# Patient Record
Sex: Female | Born: 1980 | Race: White | Hispanic: No | Marital: Married | Smoking: Never smoker
Health system: Southern US, Community
[De-identification: ages and names within clinical notes are randomized; demographics above are authoritative.]

## PROBLEM LIST (undated history)

## (undated) DIAGNOSIS — M1990 Unspecified osteoarthritis, unspecified site: Secondary | ICD-10-CM

## (undated) DIAGNOSIS — M549 Dorsalgia, unspecified: Secondary | ICD-10-CM

## (undated) DIAGNOSIS — N939 Abnormal uterine and vaginal bleeding, unspecified: Secondary | ICD-10-CM

## (undated) DIAGNOSIS — R51 Headache: Secondary | ICD-10-CM

## (undated) DIAGNOSIS — R002 Palpitations: Secondary | ICD-10-CM

## (undated) DIAGNOSIS — Z8759 Personal history of other complications of pregnancy, childbirth and the puerperium: Secondary | ICD-10-CM

## (undated) DIAGNOSIS — O09299 Supervision of pregnancy with other poor reproductive or obstetric history, unspecified trimester: Secondary | ICD-10-CM

## (undated) DIAGNOSIS — E785 Hyperlipidemia, unspecified: Secondary | ICD-10-CM

## (undated) DIAGNOSIS — O139 Gestational [pregnancy-induced] hypertension without significant proteinuria, unspecified trimester: Secondary | ICD-10-CM

## (undated) DIAGNOSIS — Z3490 Encounter for supervision of normal pregnancy, unspecified, unspecified trimester: Secondary | ICD-10-CM

## (undated) DIAGNOSIS — T7840XA Allergy, unspecified, initial encounter: Secondary | ICD-10-CM

## (undated) DIAGNOSIS — F419 Anxiety disorder, unspecified: Secondary | ICD-10-CM

## (undated) DIAGNOSIS — E669 Obesity, unspecified: Secondary | ICD-10-CM

## (undated) HISTORY — DX: Encounter for supervision of normal pregnancy, unspecified, unspecified trimester: Z34.90

## (undated) HISTORY — DX: Hyperlipidemia, unspecified: E78.5

## (undated) HISTORY — PX: SPINE SURGERY: SHX786

## (undated) HISTORY — PX: TONSILLECTOMY: SUR1361

## (undated) HISTORY — DX: Allergy, unspecified, initial encounter: T78.40XA

## (undated) HISTORY — DX: Palpitations: R00.2

## (undated) HISTORY — DX: Anxiety disorder, unspecified: F41.9

## (undated) HISTORY — DX: Dorsalgia, unspecified: M54.9

## (undated) HISTORY — DX: Abnormal uterine and vaginal bleeding, unspecified: N93.9

## (undated) HISTORY — PX: WISDOM TOOTH EXTRACTION: SHX21

## (undated) HISTORY — PX: DILATION AND CURETTAGE OF UTERUS: SHX78

## (undated) HISTORY — DX: Gestational (pregnancy-induced) hypertension without significant proteinuria, unspecified trimester: O13.9

## (undated) HISTORY — DX: Obesity, unspecified: E66.9

---
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
## 2005-01-02 ENCOUNTER — Ambulatory Visit (HOSPITAL_COMMUNITY): Admission: RE | Admit: 2005-01-02 | Discharge: 2005-01-02 | Payer: Self-pay | Admitting: Obstetrics & Gynecology

## 2005-02-07 ENCOUNTER — Inpatient Hospital Stay (HOSPITAL_COMMUNITY): Admission: AD | Admit: 2005-02-07 | Discharge: 2005-02-08 | Payer: Self-pay | Admitting: Obstetrics and Gynecology

## 2005-02-20 ENCOUNTER — Inpatient Hospital Stay (HOSPITAL_COMMUNITY): Admission: RE | Admit: 2005-02-20 | Discharge: 2005-02-22 | Payer: Self-pay | Admitting: Obstetrics and Gynecology

## 2005-02-20 DIAGNOSIS — O139 Gestational [pregnancy-induced] hypertension without significant proteinuria, unspecified trimester: Secondary | ICD-10-CM

## 2005-05-23 ENCOUNTER — Ambulatory Visit: Payer: Self-pay | Admitting: Family Medicine

## 2005-05-28 ENCOUNTER — Ambulatory Visit (HOSPITAL_COMMUNITY): Admission: RE | Admit: 2005-05-28 | Discharge: 2005-05-28 | Payer: Self-pay | Admitting: Family Medicine

## 2005-06-18 ENCOUNTER — Encounter: Admission: RE | Admit: 2005-06-18 | Discharge: 2005-06-18 | Payer: Self-pay | Admitting: Neurosurgery

## 2005-07-03 ENCOUNTER — Encounter: Admission: RE | Admit: 2005-07-03 | Discharge: 2005-07-03 | Payer: Self-pay | Admitting: Neurosurgery

## 2005-07-17 ENCOUNTER — Encounter: Admission: RE | Admit: 2005-07-17 | Discharge: 2005-07-17 | Payer: Self-pay | Admitting: Neurosurgery

## 2005-12-04 ENCOUNTER — Ambulatory Visit: Payer: Self-pay | Admitting: Family Medicine

## 2007-05-15 ENCOUNTER — Other Ambulatory Visit: Admission: RE | Admit: 2007-05-15 | Discharge: 2007-05-15 | Payer: Self-pay | Admitting: Obstetrics and Gynecology

## 2008-05-19 ENCOUNTER — Other Ambulatory Visit: Admission: RE | Admit: 2008-05-19 | Discharge: 2008-05-19 | Payer: Self-pay | Admitting: Obstetrics and Gynecology

## 2009-02-16 ENCOUNTER — Encounter: Payer: Self-pay | Admitting: Family Medicine

## 2009-07-17 ENCOUNTER — Emergency Department (HOSPITAL_COMMUNITY): Admission: EM | Admit: 2009-07-17 | Discharge: 2009-07-17 | Payer: Self-pay | Admitting: Emergency Medicine

## 2009-10-09 ENCOUNTER — Ambulatory Visit: Payer: Self-pay | Admitting: Family Medicine

## 2009-10-09 DIAGNOSIS — R5381 Other malaise: Secondary | ICD-10-CM | POA: Insufficient documentation

## 2009-10-09 DIAGNOSIS — E66811 Obesity, class 1: Secondary | ICD-10-CM | POA: Insufficient documentation

## 2009-10-09 DIAGNOSIS — R5383 Other fatigue: Secondary | ICD-10-CM

## 2009-10-09 DIAGNOSIS — J018 Other acute sinusitis: Secondary | ICD-10-CM | POA: Insufficient documentation

## 2009-10-09 DIAGNOSIS — E669 Obesity, unspecified: Secondary | ICD-10-CM

## 2009-10-09 HISTORY — DX: Obesity, unspecified: E66.9

## 2009-11-30 ENCOUNTER — Other Ambulatory Visit: Admission: RE | Admit: 2009-11-30 | Discharge: 2009-11-30 | Payer: Self-pay | Admitting: Obstetrics and Gynecology

## 2010-03-12 ENCOUNTER — Ambulatory Visit: Payer: Self-pay | Admitting: Family Medicine

## 2010-03-12 DIAGNOSIS — J309 Allergic rhinitis, unspecified: Secondary | ICD-10-CM | POA: Insufficient documentation

## 2010-05-15 NOTE — Assessment & Plan Note (Signed)
Summary: office visit   Vital Signs:  Patient profile:   30 year old female Menstrual status:  regular Weight:      245.08 pounds Pulse rate:   100 / minute Pulse rhythm:   regular BP sitting:   110 / 80  History of Present Illness: Reports  that she has been doing well. She has not changed her lifestyle to facilitate weight loss, hence her weight is unchanged. She is contemplating having a 3rd pregnancy, an recent removed her contraceptive device. Denies recent fever or chills. Denies sinus pressure, nasal congestion , ear pain or sore throat. Denies chest congestion, or cough productive of sputum. Denies chest pain, palpitations, PND, orthopnea or leg swelling. Denies abdominal pain, nausea, vomitting, diarrhea or constipation. Denies change in bowel movements or bloody stool. Denies dysuria , frequency, incontinence or hesitancy. Denies  joint pain, swelling, or reduced mobility. Denies headaches, vertigo, seizures. Denies depression, anxiety or insomnia.    Allergies: 1)  ! * Zyrtec 2)  ! * Chocolate 3)  ! * Seafood  Review of Systems      See HPI Eyes:  Denies blurring and discharge. Derm:  Complains of lesion(s); skin lesions x 2 on left forearm and l;eft thighx 4 months, no pain or drainage. Endo:  Denies cold intolerance, excessive hunger, excessive thirst, and heat intolerance. Heme:  Denies abnormal bruising and bleeding. Allergy:  Denies hives or rash and itching eyes.  Physical Exam  General:  Well-developed,obese, pleasant female,,in no acute distress; alert,appropriate and cooperative throughout examination HEENT: No facial asymmetry,  EOMIno sinus tenderness, TM's Clear, oropharynx  pink and moist.   Chest: Clear to auscultation bilaterally.  CVS: S1, S2, No murmurs, No S3.   Abd: Soft, Nontender.  MS: Adequate ROM spine, hips, shoulders and knees.  Ext: No edema.   CNS: CN 2-12 intact, power tone and sensation normal throughout.   Skin: Intact,  hypopigmented lesions on left forearm and left thigh Psych: Good eye contact, normal affect.  Memory intact, not anxious or depressed appearing.    Impression & Recommendations:  Problem # 1:  MORBID OBESITY (ICD-278.01) Assessment Unchanged  Ht: 63 (10/09/2009)   Wt: 245.08 (03/12/2010)   BMI: 43.42 (10/09/2009) therapeutic lifestyle change discussed and encouraged  Complete Medication List: 1)  Claritin 10 Mg Tabs (Loratadine) .... Take 1 tablet by mouth once a day 2)  Tylenol Extra Strength 500 Mg Tabs (Acetaminophen) .... On avg 4 tabs per week for back pain 3)  Vitamin D (otc)  .... One daily  Patient Instructions: 1)  Follow up appointment in 5.79months 2)  It is important that you exercise regularly at least 20 minutes 5 times a week. If you develop chest pain, have severe difficulty breathing, or feel very tired , stop exercising immediately and seek medical attention. 3)  You need to lose weight. Consider a lower calorie diet and regular exercise.  4)  PLS call back if you  you want a referral for dermatology or if you change your mind about the phentermine .   Orders Added: 1)  Est. Patient Level III [38756]

## 2010-05-15 NOTE — Assessment & Plan Note (Signed)
Summary: new pt   Vital Signs:  Patient profile:   30 year old female Menstrual status:  regular LMP:     09/20/2009 Height:      63 inches Weight:      244.25 pounds BMI:     43.42 O2 Sat:      94 % Pulse rate:   110 / minute Pulse rhythm:   regular Resp:     16 per minute BP sitting:   114 / 80  (left arm) Cuff size:   large  Vitals Entered By: Everitt Amber LPN (October 09, 2009 1:35 PM)  Nutrition Counseling: Patient's BMI is greater than 25 and therefore counseled on weight management options. CC: New patient  LMP (date): 09/20/2009     Menstrual Status regular Enter LMP: 09/20/2009   CC:  New patient .  History of Present Illness: Reports  that she has been  doing well up until 2 weweks ago when she developed symtoms of sinusitis which have worsened. She is re-establishing care, as she has not been seen in the past 3 years.. She is concerned about weight gain, and wants to invest renewed effort through exercise and diet to reverse this. She wil call in 2 months if no success for phentermine , which she has used in the past.  Denies chest congestion, or cough productive of sputum. Denies chest pain, palpitations, PND, orthopnea or leg swelling. Denies abdominal pain, nausea, vomitting, diarrhea or constipation. Denies change in bowel movements or bloody stool. Denies dysuria , frequency, incontinence or hesitancy. Denies  joint pain, swelling, or reduced mobility. Denies headaches, vertigo, seizures. Denies depression, anxiety or insomnia. Denies  rash, lesions, or itch.     Current Medications (verified): 1)  None  Allergies (verified): 1)  ! * Zyrtec 2)  ! * Chocolate 3)  ! * Seafood  Past History:  Past Medical History: Last updated: 04/24/2007 Obesity Seasonal Allergies Chronic Back Pain  Past Surgical History: Last updated: 04/24/2007 Tonsillectomy (10/1999)  Family History: Last updated: 10/09/2009 Father:  living-healthy Mother:-living-healthy Siblings-brother-healthy:   Social History: Last updated: 10/09/2009 EMPLOYED, nurse at NICU , women's Married Never Smoked Alcohol use-no Drug use-no TWO CHILDREN, ages 11 and 61  years old in 2011  Risk Factors: Exercise: yes (04/24/2007)  Risk Factors: Smoking Status: never (12/30/2007)  Family History: Father: living-healthy Mother:-living-healthy Siblings-brother-healthy:   Social History: EMPLOYED, nurse at NICU , women's Married Never Smoked Alcohol use-no Drug use-no TWO CHILDREN, ages 93 and 62  years old in 2011  Review of Systems      See HPI General:  Complains of chills and fatigue; denies fever. Eyes:  Denies blurring, discharge, eye pain, and red eye. ENT:  Complains of nasal congestion and sinus pressure; denies sore throat; had strep April 4, then 3 week h/o facial pressure green drainage , intermittent fever and chillsup to 101 approx 1 wek ago . CV:  Denies chest pain or discomfort, palpitations, and swelling of feet. Resp:  Denies cough, shortness of breath, sputum productive, and wheezing. GI:  Denies abdominal pain, constipation, diarrhea, nausea, and vomiting. GU:  Denies dysuria and urinary frequency. MS:  Complains of low back pain; intermittent low back pain, has established disc disease. Derm:  Denies rash; no new skin lesions. Neuro:  Complains of headaches; denies seizures, sensation of room spinning, and tingling; frotal and ethmoidal headaches x 1 week, since sick. Psych:  Denies anxiety and depression. Endo:  Denies cold intolerance, excessive thirst, excessive urination, and heat  intolerance. Heme:  Denies abnormal bruising and bleeding. Allergy:  Complains of seasonal allergies; denies hives or rash; early spring, runny nose and eyes, uses claritin as needed.  Physical Exam  General:  Well-developed,obese, pleasant female,,in no acute distress; alert,appropriate and cooperative throughout  examination HEENT: No facial asymmetry,  EOMI, positive frontal and ethmoidal  sinus tenderness, TM's Clear, oropharynx  pink and moist.   Chest: Clear to auscultation bilaterally. anterior cervical adenitis bilaterally CVS: S1, S2, No murmurs, No S3.   Abd: Soft, Nontender.  MS: Adequate ROM spine, hips, shoulders and knees.  Ext: No edema.   CNS: CN 2-12 intact, power tone and sensation normal throughout.   Skin: Intact, no visible lesions or rashes.  Psych: Good eye contact, normal affect.  Memory intact, not anxious or depressed appearing.    Impression & Recommendations:  Problem # 1:  MORBID OBESITY (ICD-278.01) Assessment Comment Only  Ht: 63 (10/09/2009)   Wt: 244.25 (10/09/2009)   BMI: 43.42 (10/09/2009), counselled re need to establish exercise routine to incorporate 150 mins/week at least, also to inc veg and fruit intake and reduce carbs, also to change to zero cal drinks  Problem # 2:  OTHER ACUTE SINUSITIS (ICD-461.8) Assessment: Comment Only  Her updated medication list for this problem includes:    Septra Ds 800-160 Mg Tabs (Sulfamethoxazole-trimethoprim) .Marland Kitchen... Take 1 tablet by mouth two times a day  Instructed on treatment. Call if symptoms persist or worsen.   Complete Medication List: 1)  Septra Ds 800-160 Mg Tabs (Sulfamethoxazole-trimethoprim) .... Take 1 tablet by mouth two times a day 2)  Fluconazole 150 Mg Tabs (Fluconazole) .... Take 1 tablet by mouth once a day as needed  Other Orders: T-Basic Metabolic Panel [phone]) T-Lipid Profile [phone]) T-TSH [phone]) T-CBC w/Diff [phone])  Patient Instructions: 1)  F/U in 5 months  2)  It is important that you exercise regularly at least 20 minutes 5 times a week. If you develop chest pain, have severe difficulty breathing, or feel very tired , stop exercising immediately and seek medical attention. 3)  You need to lose weight. Consider a lower calorie diet and regular exercise. Goal is 3  to 4 pounds per month. 4)  BMP prior to visit, ICD-9: 5)  Lipid Panel prior to visit, ICD-9: 6)  TSH prior to visit, ICD-9:   fasting asap 7)  CBC w/ Diff prior to visit, ICD-9: 8)  You need to have a Pap Smear to prevent cervical cancer. 9)  Meds are sent i for your sinus infection Prescriptions: FLUCONAZOLE 150 MG TABS (FLUCONAZOLE) Take 1 tablet by mouth once a day as needed  #3 x 0   Entered and Authorized by:   Syliva Overman MD   Signed by:   Syliva Overman MD on 10/09/2009   Method used:   Print then Give to Patient   RxID:   [id number] SEPTRA DS 800-160 MG TABS (SULFAMETHOXAZOLE-TRIMETHOPRIM) Take 1 tablet by mouth two times a day  #20 x 0   Entered and Authorized by:   Syliva Overman MD   Signed by:   Syliva Overman MD on 10/09/2009   Method used:   Print then Give to Patient   RxID:   [id number]

## 2010-07-04 LAB — DIFFERENTIAL
Basophils Absolute: 0 10*3/uL (ref 0.0–0.1)
Basophils Relative: 0 % (ref 0–1)
Eosinophils Absolute: 0 10*3/uL (ref 0.0–0.7)
Eosinophils Relative: 0 % (ref 0–5)
Lymphocytes Relative: 6 % — ABNORMAL LOW (ref 12–46)
Lymphs Abs: 0.9 10*3/uL (ref 0.7–4.0)
Monocytes Absolute: 0.6 10*3/uL (ref 0.1–1.0)
Monocytes Relative: 4 % (ref 3–12)
Neutro Abs: 12 10*3/uL — ABNORMAL HIGH (ref 1.7–7.7)
Neutrophils Relative %: 89 % — ABNORMAL HIGH (ref 43–77)

## 2010-07-04 LAB — CBC
HCT: 37.2 % (ref 36.0–46.0)
Hemoglobin: 12.9 g/dL (ref 12.0–15.0)
MCHC: 34.7 g/dL (ref 30.0–36.0)
MCV: 83.2 fL (ref 78.0–100.0)
Platelets: 256 10*3/uL (ref 150–400)
RBC: 4.48 MIL/uL (ref 3.87–5.11)
RDW: 13.9 % (ref 11.5–15.5)
WBC: 13.4 10*3/uL — ABNORMAL HIGH (ref 4.0–10.5)

## 2010-07-04 LAB — BASIC METABOLIC PANEL
BUN: 13 mg/dL (ref 6–23)
CO2: 21 mEq/L (ref 19–32)
Calcium: 9.3 mg/dL (ref 8.4–10.5)
Chloride: 105 mEq/L (ref 96–112)
Creatinine, Ser: 0.74 mg/dL (ref 0.4–1.2)
GFR calc Af Amer: >60 mL/min (ref >60)
GFR calc non Af Amer: >60 mL/min (ref >60)
Glucose, Bld: 123 mg/dL — ABNORMAL HIGH (ref 70–99)
Potassium: 3.8 mEq/L (ref 3.5–5.1)
Sodium: 134 mEq/L — ABNORMAL LOW (ref 135–145)

## 2010-07-04 LAB — URINALYSIS, ROUTINE W REFLEX MICROSCOPIC
Bilirubin Urine: NEGATIVE
Glucose, UA: NEGATIVE mg/dL
Hgb urine dipstick: NEGATIVE
Ketones, ur: NEGATIVE mg/dL
Nitrite: NEGATIVE
Protein, ur: NEGATIVE mg/dL
Specific Gravity, Urine: 1.015 (ref 1.005–1.030)
Urobilinogen, UA: 0.2 mg/dL (ref 0.0–1.0)
pH: 8 (ref 5.0–8.0)

## 2010-07-04 LAB — D-DIMER, QUANTITATIVE (NOT AT ARMC): D-Dimer, Quant: 0.22 ug/mL-FEU (ref 0.00–0.48)

## 2010-07-04 LAB — PREGNANCY, URINE: Preg Test, Ur: NEGATIVE

## 2010-08-30 ENCOUNTER — Encounter: Payer: Self-pay | Admitting: Family Medicine

## 2010-08-31 NOTE — Op Note (Signed)
NAMENIKYA, BUSLER NO.:  [id number]   MEDICAL RECORD NO.:  [id number]          PATIENT TYPE:  INP   LOCATION:  LDR4                          FACILITY:  [facility]   PHYSICIAN:  Lazaro Arms, M.D.   DATE OF BIRTH:  11-12-1980   DATE OF PROCEDURE:  02/20/2005  DATE OF DISCHARGE:  02/22/2005                                 OPERATIVE REPORT   EPIDURAL NOTE:  [name] was placed in a sitting position after she was in  early labor. Betadine prep was used. The area was field draped. One percent  lidocaine was injected at the L3-L4 interspace. A 17-gauge Tuohy needle was  used, and a loss-of-resistance technique employed, and the epidural space  was found with one pass without difficulty. Ten cc of 0.125% bupivacaine was  given into the epidural space as test dose. The epidural catheter was then  fed. An additional 10 cc was given to dose up the epidural. It was taped 5  cm into the epidural space, and a continuous infusion of 0.125% bupivacaine  and 2 mcg/cc of fentanyl was begun at 12 cc an hour. The patient is getting  good pain relief. Fetal heart rate tracing is stable, and the blood pressure  is stable.      Lazaro Arms, M.D.  Electronically Signed     LHE/MEDQ  D:  03/13/2005  T:  03/14/2005  Job:  [id number]

## 2010-08-31 NOTE — Discharge Summary (Signed)
NAMEERIKKA, FOLLMER NO.:  [id number]   MEDICAL RECORD NO.:  [id number]          PATIENT TYPE:  INP   LOCATION:  LDR4                          FACILITY:  [facility]   PHYSICIAN:  Lazaro Arms, M.D.   DATE OF BIRTH:  [birth year]   DATE OF ADMISSION:  02/07/2005  DATE OF DISCHARGE:  10/27/2006LH                                 DISCHARGE SUMMARY   DISCHARGE DIAGNOSES:  1.  Intrauterine pregnancy at 37-plus weeks gestation.  2.  Rule out mild preeclampsia.   HISTORY:  Please refer to the history and physical in the antepartum chart  for details of admission to the hospital.   HOSPITAL COURSE:  Patient was admitted for evaluation of elevated blood  pressure and swelling.  She was kept in the hospital, underwent a 24-hour  urine which was negative for confirmation of mild preeclampsia and blood  pressure became stable, all of her labs were stable.  She was discharged to  home after greater than 24 hours of observation to take her blood pressure  at home and to follow up next week as scheduled.      Lazaro Arms, M.D.  Electronically Signed     LHE/MEDQ  D:  03/13/2005  T:  03/13/2005  Job:  [id number]

## 2010-08-31 NOTE — H&P (Signed)
[name], [name] NO.:  [id number]   MEDICAL RECORD NO.:  [id number]          PATIENT TYPE:  OIB   LOCATION:  LDR4                          FACILITY:  [facility]   PHYSICIAN:  Tilda Burrow, M.D. DATE OF BIRTH:  [birth year]   DATE OF ADMISSION:  02/06/2005  DATE OF DISCHARGE:  LH                                HISTORY & PHYSICAL   REASON FOR ADMISSION:  Pregnancy at 37 weeks and 3 days with complaints of  headache and elevated blood pressure.   HISTORY OF PRESENT ILLNESS:  [name] is a 30 year old, gravida 2, para 1,  EDC is February 22, 2005, admitted from home with severe headache and blood  pressure elevations at home.  Upon arrival, she was normotensive but shortly  after blood pressures were elevated at 147/95.   MEDICAL HISTORY:  Negative.   SURGICAL HISTORY:  Positive for tonsils.   PRENATAL COURSE:  Essentially uneventful up until this point.  Blood type is  A positive.  Rubella is immune.  Hepatitis B surface antigen is negative.  HIV is negative.  HSV-2 negative.  Pap is normal.  GC and Chlamydia are  negative; repeat is negative.  A 28-week hemoglobin 10.8, 28-week hematocrit  35, and one-hour glucose was 111.   PLAN:  We are going to get vital signs q.4h. Liver, CBC, urinalysis, monitor  vital signs q.4h.  She does have a reactive nonstress test and therapeutic  rest and probable discharge in the a.m.   PHYSICAL EXAMINATION:  VITAL SIGNS:  150s over 95 blood pressure, pulse is a  little tachy in the 110s to 120s.  HEART:  Regular to rhythm and rate.  LUNGS:  Clear to auscultation bilaterally.  EXTREMITIES:  She does have trace to 1+ edema in the lower extremities.   LABORATORY DATA:  Pending.   We will assess again in the a.m.      [name], [name]      Tilda Burrow, M.D.  Electronically Signed    DL/MEDQ  D:  16/01/9603  T:  02/06/2005  Job:  [id number]   cc:   [name]

## 2010-08-31 NOTE — H&P (Signed)
[name], [name] NO.:  [id number]   MEDICAL RECORD NO.:  [id number]          PATIENT TYPE:  INP   LOCATION:  LDR4                          FACILITY:  [facility]   PHYSICIAN:  Tilda Burrow, M.D. DATE OF BIRTH:  Aug 18, 1980   DATE OF ADMISSION:  02/20/2005  DATE OF DISCHARGE:  11/10/2006LH                                HISTORY & PHYSICAL   ADMITTING DIAGNOSIS:  Pregnancy 39 plus weeks gestation admitted for  elective induction of labor.   HISTORY OF PRESENT ILLNESS:  This 30 year old female gravida 2, para 1, AB  0, last menstrual period May 18, 2004 placing menstrual [facility] February 22, 2005 was admitted at 39 weeks 5 days for elective induction after  arrangement by [name].  She was scheduled for admission with  last cervical exam in the office showing the cervix to be 3 cm, thick and  uneffaced, -2 station with vertex presentation.  She was admitted at 5 a.m.  on February 20, 2005 for amniotomy and Pitocin induction of labor.  Prenatal  course had been notable for reflux esophagitis.  There is a question of an  accessory lobe of the placenta on initial ultrasound done at [facility], none seen in our office in followup.   PAST MEDICAL HISTORY:  Benign.   SURGICAL HISTORY:  Tonsillectomy in 2001.   ALLERGIES:  ZYRTEC and CHOCOLATE.   MEDICATIONS:  Prenatal vitamins and Prilosec OTC.   GENERAL EXAMINATION:  General exam shows a healthy-appearing Caucasian  female weight 249, a 42-pound weight gain with a term-size fetus vertex  presentation.   Blood type A positive, rubella immunity present, hemoglobin 12, hematocrit  40, hepatitis/HIV/RPR and GC/Chlamydia all negative.  HSV-2 negative.  MSAFP  declined.   PLAN:  Patient admitted for amniotomy and Pitocin induction on the morning  of February 20, 2005.  We will request epidural.  Good prognosis for vaginal  delivery.      Tilda Burrow, M.D.  Electronically  Signed     JVF/MEDQ  D:  03/06/2005  T:  03/06/2005  Job:  [id number]

## 2010-08-31 NOTE — Group Therapy Note (Signed)
NAMEPRIYANKA, [name] NO.:  [id number]   MEDICAL RECORD NO.:  [id number]          PATIENT TYPE:  INP   LOCATION:  LDR4                          FACILITY:  [facility]   PHYSICIAN:  Tilda Burrow, M.D. DATE OF BIRTH:  12/12/1980   DATE OF PROCEDURE:  02/20/2005  DATE OF DISCHARGE:                                   PROGRESS NOTE   DELIVERY SUMMARY:  Onset of labor February 20, 2005 at 9 a.m. Date of  delivery February 20, 2005 at 1526. Length of first stage labor 5 hours and  45 minutes. Length of second stage labor 41 minutes. Length of third stage  labor 10 minutes.   DELIVERY NOTE:  Roslind had a normal spontaneous vaginal delivery of a  viable female infant. Upon delivery of head, there was a mild shoulder  rotation and delivery without difficulty. Upon delivery, infant was  thoroughly suctioned and dried. Cord clamped and cut and passed off to  nurses for newborn care. Third stage of labor was actually managed with 20  units of Pitocin, 1,000 cc of D5 LR at a rapid rate. Placenta was delivered  spontaneously via Tomasa Blase mechanism. A three-vessel cord was noted upon  inspection, and membranes are noted to be intact upon inspection. There was  question of whether there was an __ACCESSORY__ lobe, but none was noted upon  delivery. Upon inspection, there is a second degree perineal laceration  which was infiltrated with 20 cc of 1% lidocaine and repaired with 2-0  Vicryl 3 interrupted sutures and 2-0 Vicryl subcuticular to close. Good  hemostasis was obtained. Estimated blood loss was about 400 cc. Epidural  catheter was removed with blue tip intact. Infant and mother stabilized and  transferred up to postpartum unit in stable condition.      [name], [name]      Tilda Burrow, M.D.  Electronically Signed    DL/MEDQ  D:  16/01/9603  T:  02/20/2005  Job:  [id number]   cc:   Francoise Schaumann. Halford Chessman  Fax: [phone]

## 2010-09-05 ENCOUNTER — Ambulatory Visit (INDEPENDENT_AMBULATORY_CARE_PROVIDER_SITE_OTHER): Payer: Commercial Managed Care - PPO | Admitting: Family Medicine

## 2010-09-05 ENCOUNTER — Encounter: Payer: Self-pay | Admitting: Family Medicine

## 2010-09-05 VITALS — BP 124/84 | HR 88 | Resp 16 | Ht 63.0 in | Wt 249.0 lb

## 2010-09-05 DIAGNOSIS — J301 Allergic rhinitis due to pollen: Secondary | ICD-10-CM

## 2010-09-05 DIAGNOSIS — G4700 Insomnia, unspecified: Secondary | ICD-10-CM

## 2010-09-05 DIAGNOSIS — R5381 Other malaise: Secondary | ICD-10-CM

## 2010-09-05 DIAGNOSIS — M549 Dorsalgia, unspecified: Secondary | ICD-10-CM

## 2010-09-05 HISTORY — DX: Dorsalgia, unspecified: M54.9

## 2010-09-05 MED ORDER — ZOLPIDEM TARTRATE 10 MG PO TABS: 10.0000 mg | ORAL_TABLET | Freq: Every evening | ORAL | Status: AC | PRN

## 2010-09-05 MED ORDER — PHENTERMINE HCL 37.5 MG PO TABS: 37.5000 mg | ORAL_TABLET | Freq: Every day | ORAL | Status: DC

## 2010-09-05 MED ORDER — HYDROCODONE-ACETAMINOPHEN 5-500 MG PO TABS: ORAL_TABLET | ORAL | Status: DC

## 2010-09-05 NOTE — Progress Notes (Signed)
  Subjective:    Patient ID: [name], female    DOB: [birth year], 30 y.o.   MRN: [id number]  HPI 4 month h/o  Difficulty staying asleep, no reported increased anxrety or stress, though , her spouse recently started his own business and there has been less income.  Frustrated with weight loss , wants to start phentermine will start half tab  Has not been exercising on a consistent basis and c/o increased back painat the end ofher work shift. Review of Systems    Denies recent fever or chills. Denies sinus pressure,  ear pain or sore throat.Increased allergy symptoms , which is typical for Spring and Summer Denies chest congestion, productive cough or wheezing. Denies chest pains, palpitations, paroxysmal nocturnal dyspnea, orthopnea and leg swelling Denies abdominal pain, nausea, vomiting,diarrhea or constipation.  Denies rectal bleeding or change in bowel movement. Denies dysuria, frequency, hesitancy or incontinence.  Denies headaches, seizure, numbness, or tingling. Denies depression or  anxiety. Denies skin break down or rash.     Objective:   Physical Exam Patient alert and oriented and in no Cardiopulmonary distress.  HEENT: No facial asymmetry, EOMI, no sinus tenderness, TM's clear, Oropharynx pink and moist.  Neck supple no adenopathy.  Chest: Clear to auscultation bilaterally.  CVS: S1, S2 no murmurs, no S3.  ABD: Soft non tender. Bowel sounds normal.  Ext: No edema  MS: Adequate ROM spine, shoulders, hips and knees.  Skin: Intact, no ulcerations or rash noted.  Psych: Good eye contact, normal affect. Memory intact not anxious or depressed appearing.  CNS: CN 2-12 intact, power, tone and sensation normal throughout.        Assessment & Plan:

## 2010-09-05 NOTE — Patient Instructions (Addendum)
F/u in 3.5 months.  It is important that you exercise regularly at least 30 minutes 7 times a week. If you develop chest pain, have severe difficulty breathing, or feel very tired, stop exercising immediately and seek medical attention   A healthy diet is rich in fruit, vegetables and whole grains. Poultry fish, nuts and beans are a healthy choice for protein rather then red meat. A low sodium diet and drinking 64 ounces of water daily is generally recommended. Oils and sweet should be limited. Carbohydrates especially for those who are diabetic or overweight, should be limited to 34-45 gram per meal. It is important to eat on a regular schedule, at least 3 times daily. Snacks should be primarily fruits, vegetables or nuts.   CBC fasting bmp, lipids, hBA1C, , vit D, TSH in 3.5 months and copy to dr Emelda Fear  New meds as doscussed for sleep,weight and back pain

## 2010-09-10 NOTE — Assessment & Plan Note (Addendum)
Unchanged, wants to use phentermine which has helped in the past and intends to track calories Goal of a  Minimum of 4 pounds weight loss per month

## 2010-09-10 NOTE — Assessment & Plan Note (Signed)
Unchanged , med use as needed only

## 2010-09-10 NOTE — Assessment & Plan Note (Signed)
New prob x 4 months, denies increased stress, good sleep hygiene practiced, will start sleep aid short term

## 2010-09-10 NOTE — Assessment & Plan Note (Signed)
Deteriorated, esp at end of workday, will use vicodin judiciously and work at strengthening exercises, good posture and weight loss, she has disc disease

## 2010-12-04 ENCOUNTER — Encounter: Payer: Self-pay | Admitting: Family Medicine

## 2010-12-05 ENCOUNTER — Ambulatory Visit (INDEPENDENT_AMBULATORY_CARE_PROVIDER_SITE_OTHER): Payer: Commercial Managed Care - PPO | Admitting: Family Medicine

## 2010-12-05 ENCOUNTER — Encounter: Payer: Self-pay | Admitting: Family Medicine

## 2010-12-05 DIAGNOSIS — R5383 Other fatigue: Secondary | ICD-10-CM

## 2010-12-05 DIAGNOSIS — R5381 Other malaise: Secondary | ICD-10-CM

## 2010-12-05 DIAGNOSIS — Z139 Encounter for screening, unspecified: Secondary | ICD-10-CM

## 2010-12-05 DIAGNOSIS — M549 Dorsalgia, unspecified: Secondary | ICD-10-CM

## 2010-12-05 DIAGNOSIS — R7301 Impaired fasting glucose: Secondary | ICD-10-CM

## 2010-12-05 DIAGNOSIS — G4700 Insomnia, unspecified: Secondary | ICD-10-CM

## 2010-12-05 DIAGNOSIS — J301 Allergic rhinitis due to pollen: Secondary | ICD-10-CM

## 2010-12-05 DIAGNOSIS — Z13220 Encounter for screening for lipoid disorders: Secondary | ICD-10-CM

## 2010-12-05 NOTE — Patient Instructions (Signed)
F/U in mid January.  It is important that you exercise regularly at least 30 minutes 5 times a week. If you develop chest pain, have severe difficulty breathing, or feel very tired, stop exercising immediately and seek medical attention      Congrats on weight  Loss, goal of 25 pounds in the next several months.    Fasting labs

## 2010-12-17 NOTE — Assessment & Plan Note (Signed)
Increased symptoms , as expected as weather begins to change, med to be used more regularly

## 2010-12-17 NOTE — Assessment & Plan Note (Signed)
Still uses pain med on an as needed basis, though reports less need

## 2010-12-17 NOTE — Assessment & Plan Note (Signed)
Improved. Pt applauded on succesful weight loss through lifestyle change, and encouraged to continue same. Weight loss goal set for the next several months.  

## 2010-12-17 NOTE — Assessment & Plan Note (Signed)
Improved, and not using prescription med, less stressed, and more active

## 2010-12-17 NOTE — Progress Notes (Signed)
  Subjective:    Patient ID: [name], female    DOB: November 26, 1980, 30 y.o.   MRN: [id number]  HPI The PT is here for follow up and re-evaluation of chronic medical conditions, medication management and review of any available recent lab and radiology data.  Preventive health is updated, specifically  Cancer screening and Immunization.   Questions or concerns regarding consultations or procedures which the PT has had in the interim are  addressed. The PT denies any adverse reactions to current medications since the last visit.  There are no new concerns.  C/o increased allergy symptoms of nasal stuffiness , sinus pressure and post nasal drainage [name] has done exceptionally well as far as weight loss is concerned, by holding herself accountable to sticking within a certain caloric range. She is not relying on appetite suppressants and is working on increasing her physical activity     Review of Systems Denies recent fever or chills. Denies sinus pressure, nasal congestion, ear pain or sore throat. Denies chest congestion, productive cough or wheezing. Denies chest pains, palpitations and leg swelling Denies abdominal pain, nausea, vomiting,diarrhea or constipation.   Denies dysuria, frequency, hesitancy or incontinence. Denies joint pain, swelling and limitation in mobility. Denies headaches, seizures, numbness, or tingling. Denies depression, anxiety or insomnia. Denies skin break down or rash.        Objective:   Physical Exam Patient alert and oriented and in no cardiopulmonary distress.  HEENT: No facial asymmetry, EOMI, no sinus tenderness,  oropharynx pink and moist.  Neck supple no adenopathy.  Chest: Clear to auscultation bilaterally.  CVS: S1, S2 no murmurs, no S3.  ABD: Soft non tender. Bowel sounds normal.  Ext: No edema  MS: Adequate ROM spine, shoulders, hips and knees.  Skin: Intact, no ulcerations or rash noted.  Psych: Good eye contact,  normal affect. Memory intact not anxious or depressed appearing.  CNS: CN 2-12 intact, power, tone and sensation normal throughout.        Assessment & Plan:

## 2011-02-14 ENCOUNTER — Telehealth: Payer: Self-pay

## 2011-02-14 NOTE — Telephone Encounter (Signed)
Called and said she has had a sinus infection for over a week now. Greenish congestion and presuure in sinuses, headache and some cough. Wants to know if you can send her in something for it without her having to come in or do you want an OV?

## 2011-02-15 ENCOUNTER — Encounter: Payer: Self-pay | Admitting: Family Medicine

## 2011-02-15 NOTE — Telephone Encounter (Signed)
Noted, I guess she will go to urgent care

## 2011-02-15 NOTE — Telephone Encounter (Signed)
Needs to e mD, if any available plsgive appt or  advise pt urgent care

## 2011-02-15 NOTE — Telephone Encounter (Signed)
Offered her two different appointments that will not work for coming from [place]

## 2011-02-19 ENCOUNTER — Ambulatory Visit (INDEPENDENT_AMBULATORY_CARE_PROVIDER_SITE_OTHER): Payer: Commercial Managed Care - PPO | Admitting: Family Medicine

## 2011-02-19 ENCOUNTER — Encounter: Payer: Self-pay | Admitting: Family Medicine

## 2011-02-19 VITALS — BP 120/84 | HR 83 | Resp 16 | Ht 63.0 in | Wt 210.0 lb

## 2011-02-19 DIAGNOSIS — J40 Bronchitis, not specified as acute or chronic: Secondary | ICD-10-CM

## 2011-02-19 DIAGNOSIS — J018 Other acute sinusitis: Secondary | ICD-10-CM

## 2011-02-19 DIAGNOSIS — J209 Acute bronchitis, unspecified: Secondary | ICD-10-CM

## 2011-02-19 MED ORDER — BENZONATATE 100 MG PO CAPS: 100.0000 mg | ORAL_CAPSULE | Freq: Four times a day (QID) | ORAL | Status: DC | PRN

## 2011-02-19 MED ORDER — FLUCONAZOLE 150 MG PO TABS: 150.0000 mg | ORAL_TABLET | Freq: Once | ORAL | Status: AC

## 2011-02-19 MED ORDER — SULFAMETHOXAZOLE-TRIMETHOPRIM 800-160 MG PO TABS: 1.0000 | ORAL_TABLET | Freq: Two times a day (BID) | ORAL | Status: AC

## 2011-02-19 NOTE — Patient Instructions (Signed)
F/u in 4.5 month  Please cancel January appointment.   You being treated for sinusitis  And bronchitis, Medication has been sent in.   CONGRATS on cotinued weight loss, keep it up.  Please commit to regular exercise at least 5 days per week   Weight loss goal of 4 to 5 pounds per month

## 2011-02-19 NOTE — Progress Notes (Signed)
  Subjective:    Patient ID: [name], female    DOB: 01/31/1981, 30 y.o.   MRN: [id number]  HPI 2 week h/o head and chest congestion which is worsening. Reports yelllow green nasal drainage with frontal and ethmoidal sinus pressure , and cough productive of yellow green sputum. Continues to lose weight very well with calorie counting, exercise routine still not in place   Review of Systems See HPI C/o recent fever or chills. Denies chest pains, palpitations and leg swelling Denies abdominal pain, nausea, vomiting,diarrhea or constipation.   Denies dysuria, frequency, hesitancy or incontinence. Denies joint pain, swelling and limitation in mobility. Denies headaches, seizures, numbness, or tingling. Denies depression, anxiety or insomnia. Denies skin break down or rash.        Objective:   Physical Exam  Patient alert and oriented and in no cardiopulmonary distress.  HEENT: No facial asymmetry, EOMI,frontal and ethmoidal  sinus tenderness,  oropharynx pink and moist.  Neck supple no adenopathy.  Chest: decreased air entry, scattered crackles , no wheezes  CVS: S1, S2 no murmurs, no S3.  ABD: Soft non tender. Bowel sounds normal.  Ext: No edema  MS: Adequate ROM spine, shoulders, hips and knees.  Skin: Intact, no ulcerations or rash noted.  Psych: Good eye contact, normal affect. Memory intact not anxious or depressed appearing.  CNS: CN 2-12 intact, power, tone and sensation normal throughout.       Assessment & Plan:

## 2011-02-20 DIAGNOSIS — J209 Acute bronchitis, unspecified: Secondary | ICD-10-CM | POA: Insufficient documentation

## 2011-02-20 LAB — BASIC METABOLIC PANEL
BUN: 19 mg/dL (ref 6–23)
CO2: 27 mEq/L (ref 19–32)
Calcium: 9.3 mg/dL (ref 8.4–10.5)
Chloride: 105 mEq/L (ref 96–112)
Creat: 0.66 mg/dL (ref 0.50–1.10)
Glucose, Bld: 79 mg/dL (ref 70–99)
Potassium: 4.5 mEq/L (ref 3.5–5.3)
Sodium: 141 mEq/L (ref 135–145)

## 2011-02-20 LAB — CBC WITH DIFFERENTIAL/PLATELET
Basophils Absolute: 0 10*3/uL (ref 0.0–0.1)
Basophils Relative: 0 % (ref 0–1)
Eosinophils Absolute: 0.1 10*3/uL (ref 0.0–0.7)
Eosinophils Relative: 1 % (ref 0–5)
HCT: 42.8 % (ref 36.0–46.0)
Hemoglobin: 12.9 g/dL (ref 12.0–15.0)
Lymphocytes Relative: 31 % (ref 12–46)
Lymphs Abs: 3.2 10*3/uL (ref 0.7–4.0)
MCH: 27.7 pg (ref 26.0–34.0)
MCHC: 30.1 g/dL (ref 30.0–36.0)
MCV: 91.8 fL (ref 78.0–100.0)
Monocytes Absolute: 0.8 10*3/uL (ref 0.1–1.0)
Monocytes Relative: 7 % (ref 3–12)
Neutro Abs: 6.4 10*3/uL (ref 1.7–7.7)
Neutrophils Relative %: 61 % (ref 43–77)
Platelets: 313 10*3/uL (ref 150–400)
RBC: 4.66 MIL/uL (ref 3.87–5.11)
RDW: 14.1 % (ref 11.5–15.5)
WBC: 10.4 10*3/uL (ref 4.0–10.5)

## 2011-02-20 LAB — LIPID PANEL
Cholesterol: 202 mg/dL — ABNORMAL HIGH (ref 0–200)
HDL: 36 mg/dL — ABNORMAL LOW (ref >39)
LDL Cholesterol: 113 mg/dL — ABNORMAL HIGH (ref 0–99)
Total CHOL/HDL Ratio: 5.6 Ratio
Triglycerides: 267 mg/dL — ABNORMAL HIGH (ref <150)
VLDL: 53 mg/dL — ABNORMAL HIGH (ref 0–40)

## 2011-02-20 LAB — HEMOGLOBIN A1C
Hgb A1c MFr Bld: 5.3 % (ref <5.7)
Mean Plasma Glucose: 105 mg/dL (ref <117)

## 2011-02-20 LAB — TSH: TSH: 1.852 u[IU]/mL (ref 0.350–4.500)

## 2011-02-20 LAB — VITAMIN D 25 HYDROXY (VIT D DEFICIENCY, FRACTURES): Vit D, 25-Hydroxy: 51 ng/mL (ref 30–89)

## 2011-02-20 NOTE — Assessment & Plan Note (Signed)
Antibiotics and decongestants prescribed 

## 2011-02-20 NOTE — Assessment & Plan Note (Signed)
Improved. Pt applauded on succesful weight loss through lifestyle change, and encouraged to continue same. Weight loss goal set for the next several months.  

## 2011-02-20 NOTE — Assessment & Plan Note (Signed)
Acute sinusitis , 2 week course of antibiotics prescribed

## 2011-05-08 ENCOUNTER — Ambulatory Visit: Payer: Commercial Managed Care - PPO | Admitting: Family Medicine

## 2011-07-24 ENCOUNTER — Encounter: Payer: Self-pay | Admitting: Family Medicine

## 2011-07-24 ENCOUNTER — Ambulatory Visit (INDEPENDENT_AMBULATORY_CARE_PROVIDER_SITE_OTHER): Payer: 59 | Admitting: Family Medicine

## 2011-07-24 VITALS — BP 110/74 | HR 118 | Resp 16 | Ht 63.0 in | Wt 206.4 lb

## 2011-07-24 DIAGNOSIS — E669 Obesity, unspecified: Secondary | ICD-10-CM

## 2011-07-24 DIAGNOSIS — J301 Allergic rhinitis due to pollen: Secondary | ICD-10-CM

## 2011-07-24 DIAGNOSIS — M549 Dorsalgia, unspecified: Secondary | ICD-10-CM

## 2011-07-24 NOTE — Progress Notes (Signed)
  Subjective:    Patient ID: [name], female    DOB: 04-29-1980, 31 y.o.   MRN: [id number]  HPI The PT is here for follow up and re-evaluation of chronic medical conditions, medication management and review of any available recent lab and radiology data.  Preventive health is updated, specifically  Cancer screening and Immunization. Needs TdAP  Has not been as diligent with diet and exercise due to health challenges in her immediate family, intends to work on this Allergies are flaring as expected at this time will use oral meds only Chronic back pain with disc disease wants PT eval to go to therapy school     Review of Systems See HPI Denies recent fever or chills. Denies sinus pressure, nasal congestion, ear pain or sore throat. Denies chest congestion, productive cough or wheezing. Denies chest pains, palpitations and leg swelling Denies abdominal pain, nausea, vomiting,diarrhea or constipation.   Denies dysuria, frequency, hesitancy or incontinence. . Denies headaches, seizures, numbness, or tingling. Denies depression, anxiety or insomnia. Denies skin break down or rash.       Objective:   Physical Exam  Patient alert and oriented and in no cardiopulmonary distress.  HEENT: No facial asymmetry, EOMI, no sinus tenderness,  oropharynx pink and moist.  Neck supple no adenopathy.  Chest: Clear to auscultation bilaterally.  CVS: S1, S2 no murmurs, no S3.  ABD: Soft non tender. Bowel sounds normal.  Ext: No edema  MS: Adequate ROM spine, shoulders, hips and knees.  Skin: Intact, no ulcerations or rash noted.  Psych: Good eye contact, normal affect. Memory intact not anxious or depressed appearing.  CNS: CN 2-12 intact, power, tone and sensation normal throughout.       Assessment & Plan:

## 2011-07-24 NOTE — Assessment & Plan Note (Signed)
Improved. Pt applauded on succesful weight loss through lifestyle change, and encouraged to continue same. Weight loss goal set for the next several months.  

## 2011-07-24 NOTE — Assessment & Plan Note (Signed)
Chronic back pain, wants to go to therapy school, needs PT referral

## 2011-07-24 NOTE — Patient Instructions (Signed)
F/u in 4 month  Weight loss goal of 15 to 20 pounds   You will get a referral for physical therapy evaluation for chronic back pain  CONGRATS on 50 pound weight loss in the past year

## 2011-07-24 NOTE — Assessment & Plan Note (Signed)
Increased symptoms due to Spring season as expected, will use claritin and sudafed

## 2011-07-29 ENCOUNTER — Ambulatory Visit: Payer: 59 | Attending: Family Medicine | Admitting: Physical Therapy

## 2011-07-29 DIAGNOSIS — M545 Low back pain, unspecified: Secondary | ICD-10-CM | POA: Insufficient documentation

## 2011-07-29 DIAGNOSIS — IMO0001 Reserved for inherently not codable concepts without codable children: Secondary | ICD-10-CM | POA: Insufficient documentation

## 2011-07-29 DIAGNOSIS — R5381 Other malaise: Secondary | ICD-10-CM | POA: Insufficient documentation

## 2011-07-29 DIAGNOSIS — M546 Pain in thoracic spine: Secondary | ICD-10-CM | POA: Insufficient documentation

## 2011-08-01 ENCOUNTER — Ambulatory Visit: Payer: 59 | Admitting: *Deleted

## 2011-08-06 ENCOUNTER — Ambulatory Visit: Payer: 59 | Admitting: *Deleted

## 2011-08-08 ENCOUNTER — Ambulatory Visit: Payer: 59 | Admitting: Physical Therapy

## 2011-08-13 ENCOUNTER — Ambulatory Visit: Payer: 59 | Admitting: Physical Therapy

## 2011-08-15 ENCOUNTER — Ambulatory Visit: Payer: 59 | Attending: Family Medicine | Admitting: Physical Therapy

## 2011-08-15 DIAGNOSIS — IMO0001 Reserved for inherently not codable concepts without codable children: Secondary | ICD-10-CM | POA: Insufficient documentation

## 2011-08-15 DIAGNOSIS — M546 Pain in thoracic spine: Secondary | ICD-10-CM | POA: Insufficient documentation

## 2011-08-15 DIAGNOSIS — M545 Low back pain, unspecified: Secondary | ICD-10-CM | POA: Insufficient documentation

## 2011-08-15 DIAGNOSIS — R5381 Other malaise: Secondary | ICD-10-CM | POA: Insufficient documentation

## 2011-08-20 ENCOUNTER — Ambulatory Visit: Payer: 59 | Admitting: Physical Therapy

## 2011-08-22 ENCOUNTER — Ambulatory Visit: Payer: 59 | Admitting: Physical Therapy

## 2011-08-27 ENCOUNTER — Ambulatory Visit: Payer: 59 | Admitting: Physical Therapy

## 2011-08-30 ENCOUNTER — Ambulatory Visit: Payer: 59 | Admitting: *Deleted

## 2011-09-03 ENCOUNTER — Ambulatory Visit: Payer: 59 | Admitting: Physical Therapy

## 2011-09-05 ENCOUNTER — Ambulatory Visit: Payer: 59 | Admitting: Physical Therapy

## 2011-09-10 ENCOUNTER — Ambulatory Visit: Payer: 59 | Admitting: Physical Therapy

## 2011-09-12 ENCOUNTER — Encounter: Payer: 59 | Admitting: Physical Therapy

## 2011-09-13 ENCOUNTER — Ambulatory Visit: Payer: 59 | Admitting: Physical Therapy

## 2011-09-17 ENCOUNTER — Ambulatory Visit: Payer: 59 | Attending: Family Medicine | Admitting: Physical Therapy

## 2011-09-17 DIAGNOSIS — R5381 Other malaise: Secondary | ICD-10-CM | POA: Insufficient documentation

## 2011-09-17 DIAGNOSIS — IMO0001 Reserved for inherently not codable concepts without codable children: Secondary | ICD-10-CM | POA: Insufficient documentation

## 2011-09-17 DIAGNOSIS — M545 Low back pain, unspecified: Secondary | ICD-10-CM | POA: Insufficient documentation

## 2011-09-17 DIAGNOSIS — M546 Pain in thoracic spine: Secondary | ICD-10-CM | POA: Insufficient documentation

## 2011-09-19 ENCOUNTER — Ambulatory Visit: Payer: 59 | Admitting: Physical Therapy

## 2011-09-24 ENCOUNTER — Ambulatory Visit: Payer: 59 | Admitting: Physical Therapy

## 2011-09-26 ENCOUNTER — Ambulatory Visit: Payer: 59 | Admitting: Physical Therapy

## 2011-09-30 ENCOUNTER — Ambulatory Visit: Payer: 59 | Admitting: *Deleted

## 2011-11-26 ENCOUNTER — Encounter: Payer: Self-pay | Admitting: Family Medicine

## 2011-11-26 ENCOUNTER — Ambulatory Visit (INDEPENDENT_AMBULATORY_CARE_PROVIDER_SITE_OTHER): Payer: 59 | Admitting: Family Medicine

## 2011-11-26 VITALS — BP 122/82 | HR 96 | Resp 16 | Ht 63.0 in | Wt 210.1 lb

## 2011-11-26 DIAGNOSIS — Z139 Encounter for screening, unspecified: Secondary | ICD-10-CM

## 2011-11-26 DIAGNOSIS — R5381 Other malaise: Secondary | ICD-10-CM

## 2011-11-26 DIAGNOSIS — J301 Allergic rhinitis due to pollen: Secondary | ICD-10-CM

## 2011-11-26 DIAGNOSIS — M549 Dorsalgia, unspecified: Secondary | ICD-10-CM

## 2011-11-26 DIAGNOSIS — E669 Obesity, unspecified: Secondary | ICD-10-CM

## 2011-11-26 DIAGNOSIS — R5383 Other fatigue: Secondary | ICD-10-CM

## 2011-11-26 DIAGNOSIS — M5090 Cervical disc disorder, unspecified, unspecified cervical region: Secondary | ICD-10-CM

## 2011-11-26 DIAGNOSIS — M5410 Radiculopathy, site unspecified: Secondary | ICD-10-CM | POA: Insufficient documentation

## 2011-11-26 MED ORDER — PREDNISONE (PAK) 5 MG PO TABS: 5.0000 mg | ORAL_TABLET | ORAL | Status: DC

## 2011-11-26 MED ORDER — TRAMADOL HCL 50 MG PO TABS: 50.0000 mg | ORAL_TABLET | Freq: Three times a day (TID) | ORAL | Status: DC | PRN

## 2011-11-26 MED ORDER — IBUPROFEN 800 MG PO TABS: 800.0000 mg | ORAL_TABLET | Freq: Three times a day (TID) | ORAL | Status: AC | PRN

## 2011-11-26 MED ORDER — PHENTERMINE HCL 37.5 MG PO TABS
37.5000 mg | ORAL_TABLET | Freq: Every day | ORAL | Status: DC
Start: 2011-11-26 — End: 2012-03-23

## 2011-11-26 NOTE — Patient Instructions (Addendum)
F/u in December.  Fasting CBc, chem 7, lipid,vit D, TSH and hBA1C in December before visit.  You are getting toradol 60mg  and depo medrol 80mg  IM in the office and medication x3 also sent in  You are referred for MRI of your spine and to see neurosurgery after this based on symptom severity  It is important that you exercise regularly at least 30 minutes 5 times a week. If you develop chest pain, have severe difficulty breathing, or feel very tired, stop exercising immediately and seek medical attention   A healthy diet is rich in fruit, vegetables and whole grains. Poultry fish, nuts and beans are a healthy choice for protein rather then red meat. A low sodium diet and drinking 64 ounces of water daily is generally recommended. Oils and sweet should be limited. Carbohydrates especially for those who are diabetic or overweight, should be limited to 30-45 gram per meal. It is important to eat on a regular schedule, at least 3 times daily. Snacks should be primarily fruits, vegetables or nuts.

## 2011-11-27 DIAGNOSIS — M549 Dorsalgia, unspecified: Secondary | ICD-10-CM

## 2011-11-27 MED ORDER — METHYLPREDNISOLONE ACETATE 80 MG/ML IJ SUSP
80.0000 mg | Freq: Once | INTRAMUSCULAR | Status: AC
  Administered 2011-11-27: 80 mg via INTRAMUSCULAR

## 2011-11-27 MED ORDER — KETOROLAC TROMETHAMINE 60 MG/2ML IM SOLN
60.0000 mg | Freq: Once | INTRAMUSCULAR | Status: AC
  Administered 2011-11-27: 60 mg via INTRAMUSCULAR

## 2011-11-28 ENCOUNTER — Other Ambulatory Visit: Payer: Self-pay | Admitting: Family Medicine

## 2011-11-28 ENCOUNTER — Ambulatory Visit (HOSPITAL_COMMUNITY)
Admission: RE | Admit: 2011-11-28 | Discharge: 2011-11-28 | Disposition: A | Discharge status: Home / Self Care | Payer: 59 | Source: Ambulatory Visit | Attending: Family Medicine | Admitting: Family Medicine

## 2011-11-28 ENCOUNTER — Other Ambulatory Visit: Payer: Self-pay | Admitting: Neurosurgery

## 2011-11-28 DIAGNOSIS — M5126 Other intervertebral disc displacement, lumbar region: Secondary | ICD-10-CM | POA: Insufficient documentation

## 2011-11-28 DIAGNOSIS — M545 Low back pain, unspecified: Secondary | ICD-10-CM | POA: Insufficient documentation

## 2011-11-28 DIAGNOSIS — M5124 Other intervertebral disc displacement, thoracic region: Secondary | ICD-10-CM | POA: Insufficient documentation

## 2011-11-28 DIAGNOSIS — M5090 Cervical disc disorder, unspecified, unspecified cervical region: Secondary | ICD-10-CM

## 2011-11-28 DIAGNOSIS — R209 Unspecified disturbances of skin sensation: Secondary | ICD-10-CM | POA: Insufficient documentation

## 2011-11-29 NOTE — Progress Notes (Signed)
  Subjective:    Patient ID: [name], female    DOB: 02-03-1981, 31 y.o.   MRN: [id number]  HPI 1 month h/o increased back pain radiating down left leg with numbness. Denies incontinence of stool or urine, denies lower extremity weakness. Feels stressed due to reduced benefits on job, however trying to work through this. Has not been as diligent with lifestyle change and has gained weight intends to reverse this   Review of Systems See HPI Denies recent fever or chills. Denies sinus pressure, nasal congestion, ear pain or sore throat.Uses allergy meds as needed Denies chest congestion, productive cough or wheezing. Denies chest pains, palpitations and leg swelling Denies abdominal pain, nausea, vomiting,diarrhea or constipation.   Denies dysuria, frequency, hesitancy or incontinence.  Denies headaches, seizures, numbness, or tingling. Denies depression, anxiety or insomnia. Denies skin break down or rash.        Objective:   Physical Exam Patient alert and oriented and in no cardiopulmonary distress.  HEENT: No facial asymmetry, EOMI, no sinus tenderness,  oropharynx pink and moist.  Neck supple no adenopathy.  Chest: Clear to auscultation bilaterally.  CVS: S1, S2 no murmurs, no S3.  ABD: Soft non tender. Bowel sounds normal.  Ext: No edema  MS: decreased  ROM lumbar  Spine,adequate in shoulders, hips and knees.  Skin: Intact, no ulcerations or rash noted.  Psych: Good eye contact, normal affect. Memory intact not anxious or depressed appearing.  CNS: CN 2-12 intact, power,  normal throughout.Decreased sensation left lower extremity        Assessment & Plan:

## 2011-11-29 NOTE — Assessment & Plan Note (Signed)
Controlled, no change in medication  

## 2011-11-29 NOTE — Assessment & Plan Note (Signed)
Deteriorated. Patient re-educated about  the importance of commitment to a  minimum of 150 minutes of exercise per week. The importance of healthy food choices with portion control discussed. Encouraged to start a food diary, count calories and to consider  joining a support group. Sample diet sheets offered. Goals set by the patient for the next several months.    

## 2011-11-29 NOTE — Assessment & Plan Note (Signed)
Acute deterioration x 1 month, MRI reveals significant disease , has upcoming surgery

## 2011-12-02 ENCOUNTER — Encounter (HOSPITAL_COMMUNITY): Payer: Self-pay

## 2011-12-02 ENCOUNTER — Encounter (HOSPITAL_COMMUNITY)
Admission: RE | Admit: 2011-12-02 | Discharge: 2011-12-02 | Disposition: A | Discharge status: Home / Self Care | Payer: 59 | Source: Ambulatory Visit | Attending: Neurosurgery | Admitting: Neurosurgery

## 2011-12-02 HISTORY — DX: Headache: R51

## 2011-12-02 HISTORY — DX: Unspecified osteoarthritis, unspecified site: M19.90

## 2011-12-02 LAB — CBC
HCT: 42.6 % (ref 36.0–46.0)
Hemoglobin: 14.3 g/dL (ref 12.0–15.0)
MCH: 29.2 pg (ref 26.0–34.0)
MCHC: 33.6 g/dL (ref 30.0–36.0)
MCV: 86.9 fL (ref 78.0–100.0)
Platelets: 340 10*3/uL (ref 150–400)
RBC: 4.9 MIL/uL (ref 3.87–5.11)
RDW: 13.6 % (ref 11.5–15.5)
WBC: 13.6 10*3/uL — ABNORMAL HIGH (ref 4.0–10.5)

## 2011-12-02 LAB — HCG, SERUM, QUALITATIVE: Preg, Serum: NEGATIVE

## 2011-12-02 MED ORDER — CEFAZOLIN SODIUM-DEXTROSE 2-3 GM-% IV SOLR
2.0000 g | INTRAVENOUS | Status: AC
  Administered 2011-12-03: 2 g via INTRAVENOUS
  Filled 2011-12-02: qty 50

## 2011-12-02 NOTE — Pre-Procedure Instructions (Signed)
22 [name]   12/02/2011   Your procedure is scheduled on: AUGUST 20TH, Tuesday               Surgery time is 4:07 - 5:57 PM   Report to [facility] at  2:00 PM  Call this number if you have problems the morning of surgery: [phone]   Remember:   Do not eat food or drink any liquids:After Midnight Monday   Take these medicines the morning of surgery with A SIP OF WATER:  Tramadol   Do not wear jewelry, make-up or nail polish.  Do not wear lotions, powders, or perfumes. You may NOT wear deodorant.   Ladies :Do not shave 48 hours prior to surgery. Men may shave face and neck.  Do not bring valuables to the hospital.   Contacts, dentures or bridgework may not be worn into surgery.   Leave suitcase in the car. After surgery it may be brought to your room.  For patients admitted to the hospital, checkout time is 11:00 AM the day of discharge.   Patients discharged the day of surgery will not be allowed to drive home.  Name and phone number of your driver:     Special Instructions: CHG Shower Use Special Wash: 1/2 bottle night before surgery and 1/2 bottle morning of surgery.   Please read over the following fact sheets that you were given: Pain Booklet, Coughing and Deep Breathing, MRSA Information and Surgical Site Infection Prevention

## 2011-12-02 NOTE — Progress Notes (Addendum)
1430...the patient REFUSED TO HAVE NASAL SWAB DONE.......DA

## 2011-12-02 NOTE — H&P (Signed)
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 Channing Mutters, but now comes in to see me for left leg pain and foot numbness. She saw Dr. Channing Mutters for L4-5 and L5-S1 disc protrusions in 2007 and an MRI of her lumbar spine showed disc bulges at L5-S1 and to a lesser degree at the L4-5 level. She now says this has been going on for greater than five years, but in December she had a severe flare of pain and then three weeks ago developed severe pain along with numbness and pain into her left leg.  She feels that she is weak in her left leg.    She had an MRI of her lumbar spine which was recently performed, which shows a very large disc herniation at L4-5 on the left with significant nerve root compression.    She has been taking Tramadol, Ibuprofen, and Prednisone taper.  She says these have not really helped her that much.  She went through physical therapy which ended in June of 2013 and she says that this helped her briefly.   REVIEW OF SYSTEMS:   A detailed Review of Systems sheet was reviewed with the patient.  Pertinent positives include under eyes - she wears glasses, under ears, nose, mouth, and throat - she notes sinus headache, under cardiovascular - she note leg pain with walking, under musculoskeletal - she notes back pain, leg pain, and arthritis, under allergic/immunologic - she notes food allergies and inhalant nasal allergies.  All other systems are negative; this includes Constitutional symptoms, Endocrine, Respiratory, Gastrointestinal, Genitourinary, Integumentary & Breast, Neurologic, Psychiatric, Hematologic/Lymphatic.    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      Prior Operations and Hospitalizations:   She has a history of wisdom teeth and tonsils removed years ago and D&C in  2010.      Medications and Allergies:  Current medications include Ibuprofen 800 mg. three times daily, Prednisone dosepak of which she is on day three, Tramadol 50 mg. as needed, Claritin 10 mg. daily for allergies, and Vitamin D 2000 I.U. daily.  She notes ALLERGIES TO ZYRTEC WHICH CAUSES SWELLING.      Height and Weight:     She is currently 5'3" tall, 206 lbs. which gives her a BMI of 36.5.  She says that she has lost 50 lbs. intentionally.    FAMILY HISTORY:      SOCIAL HISTORY:    She denies tobacco or drug use. She is a social drinker of alcoholic beverages.    DIAGNOSTIC STUDIES:   Lumbar x-rays reviewed demonstrate five well aligned lumbar vertebrae without abnormal motion.    PHYSICAL EXAMINATION:      General Appearance:   On examination today, [name] is a pleasant and cooperative woman in no acute distress.      Blood Pressure, Pulse:     Her blood pressure is 132/90.  Heart rate is 80 and regular.  Respiratory rate is 18.      HEENT - normocephalic, atraumatic.  The pupils are equal, round and reactive to light.  The extraocular muscles are intact.  Sclerae - white.  Conjunctiva - pink.  Oropharynx benign.  Uvula midline.     Neck - there are no  masses, meningismus, deformities, tracheal deviation, jugular vein distention or carotid bruits.  There is normal cervical range of motion.  Spurlings' test is negative without reproducible radicular pain turning the patient's head to either side.  Lhermitte's sign is not present with axial compression.      Respiratory - there is normal respiratory effort with good intercostal function.  Lungs are clear to auscultation.  There are no rales, rhonchi or wheezes.      Cardiovascular - the heart has regular rate and rhythm to auscultation.  No murmurs are appreciated.  There is no extremity edema, cyanosis or clubbing.  There are palpable pedal pulses.      Abdomen - obese, soft, nontender, no hepatosplenomegaly appreciated or masses.   There are active bowel sounds.  No guarding or rebound.      Musculoskeletal Examination - she is able to bend to within 4" of the floor with her upper extremities outstretched.  She is able to stand on her toes and not able to stand on her heel on the left, able to stand on her heel on the right.  Straight leg raise is positive at 30 degrees on the left.    NEUROLOGICAL EXAMINATION: The patient is oriented to time, person and place and has good recall of both recent and remote memory with normal attention span and concentration.  The patient speaks with clear and fluent speech and exhibits normal language function and appropriate fund of knowledge.      Cranial Nerve Examination - pupils are equal, round and reactive to light.  Extraocular movements are full.  Visual fields are full to confrontational testing.  Facial sensation and facial movement are symmetric and intact.  Hearing is intact to finger rub.  Palate is upgoing.  Shoulder shrug is symmetric.  Tongue protrudes in the midline.      Motor Examination - motor strength is 5/5 in the bilateral deltoids, biceps, triceps, handgrips, wrist extensors, interosseous.  In the lower extremities motor strength is 5/5 in hip flexion, extension, quadriceps, hamstrings, plantar flexion, with the exception of left hip abductor at 4-/5, left dorsiflexion 4/5, 5/5 right dorsiflexion, left extensor hallucis longus strength 4-/5, and 5/5 right extensor hallucis longus.      Sensory Examination - she has decreased pin sensation in a left L5 and S1 distribution.     Deep Tendon Reflexes - 2 in the biceps, triceps, and brachioradialis, 2 in the knees, 2 in the ankles.  The great toes are downgoing to plantar stimulation.      Cerebellar Examination - normal coordination in upper and lower extremities and normal rapid alternating movements.  Romberg test is negative.    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  I have recommended that she undergo left L4-5 microdiscectomy on an expedited basis and I plan on performing this on 12/03/2011.  Risks and benefits were discussed with the patient. She wishes to proceed.    I reviewed the studies with the patient and went over her physical examination.  I reviewed surgical models and discussed the typical hospital course and operative and postoperative course and the potential risks and benefits of surgery.  The risks of surgery were discussed in detail and include, but are not limited to, the risks of anesthesia, blood loss and the possibility of hemorrhage, infection, damage to nerves, damage to blood vessels, injury to the  lumbar nerve root causing either temporary or permanent leg pain, numbness, weakness.  There is potential for spinal fluid leak from dural tear.  There is the potential for post-laminectomy spondylolisthesis, recurrent disc ruptured quoted at approximately 10%, failure to relieve pain, worsening of pain, need for further surgery.    [facility]    Danae Orleans. Venetia Maxon, M.D.  JDS:aft cc: Dr. Syliva Overman

## 2011-12-03 ENCOUNTER — Encounter (HOSPITAL_COMMUNITY): Payer: Self-pay | Admitting: Critical Care Medicine

## 2011-12-03 ENCOUNTER — Ambulatory Visit (HOSPITAL_COMMUNITY): Payer: 59 | Admitting: Critical Care Medicine

## 2011-12-03 ENCOUNTER — Ambulatory Visit (HOSPITAL_COMMUNITY): Payer: 59

## 2011-12-03 ENCOUNTER — Encounter (HOSPITAL_COMMUNITY)
Admission: RE | Disposition: A | Discharge status: Home / Self Care | Payer: Self-pay | Source: Ambulatory Visit | Attending: Neurosurgery

## 2011-12-03 ENCOUNTER — Ambulatory Visit (HOSPITAL_COMMUNITY)
Admission: RE | Admit: 2011-12-03 | Discharge: 2011-12-04 | Disposition: A | Discharge status: Home / Self Care | Payer: 59 | Source: Ambulatory Visit | Attending: Neurosurgery | Admitting: Neurosurgery

## 2011-12-03 DIAGNOSIS — M5126 Other intervertebral disc displacement, lumbar region: Secondary | ICD-10-CM | POA: Insufficient documentation

## 2011-12-03 DIAGNOSIS — E669 Obesity, unspecified: Secondary | ICD-10-CM

## 2011-12-03 DIAGNOSIS — M5090 Cervical disc disorder, unspecified, unspecified cervical region: Secondary | ICD-10-CM

## 2011-12-03 DIAGNOSIS — Z01812 Encounter for preprocedural laboratory examination: Secondary | ICD-10-CM | POA: Insufficient documentation

## 2011-12-03 DIAGNOSIS — M51379 Other intervertebral disc degeneration, lumbosacral region without mention of lumbar back pain or lower extremity pain: Secondary | ICD-10-CM | POA: Insufficient documentation

## 2011-12-03 DIAGNOSIS — M47817 Spondylosis without myelopathy or radiculopathy, lumbosacral region: Secondary | ICD-10-CM | POA: Insufficient documentation

## 2011-12-03 DIAGNOSIS — M5137 Other intervertebral disc degeneration, lumbosacral region: Secondary | ICD-10-CM | POA: Insufficient documentation

## 2011-12-03 HISTORY — PX: LUMBAR LAMINECTOMY/DECOMPRESSION MICRODISCECTOMY: SHX5026

## 2011-12-03 SURGERY — LUMBAR LAMINECTOMY/DECOMPRESSION MICRODISCECTOMY 1 LEVEL
Anesthesia: General | Site: Spine Lumbar | Laterality: Left | Wound class: Clean

## 2011-12-03 MED ORDER — ACETAMINOPHEN 500 MG PO TABS: 500.0000 mg | ORAL_TABLET | Freq: Four times a day (QID) | ORAL | Status: DC | PRN

## 2011-12-03 MED ORDER — ACETAMINOPHEN 325 MG PO TABS: 650.0000 mg | ORAL_TABLET | ORAL | Status: DC | PRN

## 2011-12-03 MED ORDER — MORPHINE SULFATE 2 MG/ML IJ SOLN: 1.0000 mg | INTRAMUSCULAR | Status: DC | PRN

## 2011-12-03 MED ORDER — PHENTERMINE HCL 37.5 MG PO TABS: 37.5000 mg | ORAL_TABLET | Freq: Every day | ORAL | Status: DC

## 2011-12-03 MED ORDER — IBUPROFEN 800 MG PO TABS
800.0000 mg | ORAL_TABLET | Freq: Three times a day (TID) | ORAL | Status: DC | PRN
  Filled 2011-12-03: qty 1

## 2011-12-03 MED ORDER — KCL IN DEXTROSE-NACL 20-5-0.45 MEQ/L-%-% IV SOLN
INTRAVENOUS | Status: DC
  Filled 2011-12-03 (×3): qty 1000

## 2011-12-03 MED ORDER — MIDAZOLAM HCL 5 MG/5ML IJ SOLN
INTRAMUSCULAR | Status: DC | PRN
  Administered 2011-12-03: 2 mg via INTRAVENOUS

## 2011-12-03 MED ORDER — ONDANSETRON HCL 4 MG/2ML IJ SOLN: 4.0000 mg | INTRAMUSCULAR | Status: DC | PRN

## 2011-12-03 MED ORDER — DIAZEPAM 5 MG PO TABS
5.0000 mg | ORAL_TABLET | Freq: Four times a day (QID) | ORAL | Status: DC | PRN
  Administered 2011-12-03 – 2011-12-04 (×2): 5 mg via ORAL
  Filled 2011-12-03 (×2): qty 1

## 2011-12-03 MED ORDER — MENTHOL 3 MG MT LOZG: 1.0000 | LOZENGE | OROMUCOSAL | Status: DC | PRN

## 2011-12-03 MED ORDER — ZOLPIDEM TARTRATE 5 MG PO TABS: 5.0000 mg | ORAL_TABLET | Freq: Every evening | ORAL | Status: DC | PRN

## 2011-12-03 MED ORDER — VITAMIN D3 25 MCG (1000 UNIT) PO TABS
2000.0000 [IU] | ORAL_TABLET | Freq: Every day | ORAL | Status: DC
  Filled 2011-12-03: qty 2

## 2011-12-03 MED ORDER — HEMOSTATIC AGENTS (NO CHARGE) OPTIME
TOPICAL | Status: DC | PRN
  Administered 2011-12-03: 1 via TOPICAL

## 2011-12-03 MED ORDER — SENNA 8.6 MG PO TABS
1.0000 | ORAL_TABLET | Freq: Two times a day (BID) | ORAL | Status: DC
  Filled 2011-12-03 (×3): qty 1

## 2011-12-03 MED ORDER — VITAMIN D 50 MCG (2000 UT) PO CAPS: 1.0000 | ORAL_CAPSULE | Freq: Every day | ORAL | Status: DC

## 2011-12-03 MED ORDER — ACETAMINOPHEN 650 MG RE SUPP: 650.0000 mg | RECTAL | Status: DC | PRN

## 2011-12-03 MED ORDER — OXYCODONE-ACETAMINOPHEN 5-325 MG PO TABS
1.0000 | ORAL_TABLET | ORAL | Status: DC | PRN
  Administered 2011-12-03 – 2011-12-04 (×2): 1 via ORAL
  Filled 2011-12-03 (×2): qty 1

## 2011-12-03 MED ORDER — THROMBIN 5000 UNITS EX KIT
PACK | CUTANEOUS | Status: DC | PRN
  Administered 2011-12-03 (×2): 5000 [IU] via TOPICAL

## 2011-12-03 MED ORDER — CEFAZOLIN SODIUM 1-5 GM-% IV SOLN
1.0000 g | Freq: Three times a day (TID) | INTRAVENOUS | Status: AC
  Administered 2011-12-03 – 2011-12-04 (×2): 1 g via INTRAVENOUS
  Filled 2011-12-03 (×2): qty 50

## 2011-12-03 MED ORDER — BUPIVACAINE HCL (PF) 0.5 % IJ SOLN
INTRAMUSCULAR | Status: DC | PRN
  Administered 2011-12-03: 5 mL

## 2011-12-03 MED ORDER — FENTANYL CITRATE 0.05 MG/ML IJ SOLN
INTRAMUSCULAR | Status: AC
  Filled 2011-12-03: qty 2

## 2011-12-03 MED ORDER — SODIUM CHLORIDE 0.9 % IR SOLN
Status: DC | PRN
  Administered 2011-12-03: 16:00:00

## 2011-12-03 MED ORDER — SODIUM CHLORIDE 0.9 % IJ SOLN: 3.0000 mL | INTRAMUSCULAR | Status: DC | PRN

## 2011-12-03 MED ORDER — DOCUSATE SODIUM 100 MG PO CAPS
100.0000 mg | ORAL_CAPSULE | Freq: Two times a day (BID) | ORAL | Status: DC
  Administered 2011-12-03: 100 mg via ORAL
  Filled 2011-12-03: qty 1

## 2011-12-03 MED ORDER — FENTANYL CITRATE 0.05 MG/ML IJ SOLN
INTRAMUSCULAR | Status: DC | PRN
  Administered 2011-12-03: 100 ug via INTRAVENOUS

## 2011-12-03 MED ORDER — LIDOCAINE HCL (CARDIAC) 20 MG/ML IV SOLN
INTRAVENOUS | Status: DC | PRN
  Administered 2011-12-03: 100 mg via INTRAVENOUS

## 2011-12-03 MED ORDER — METHYLPREDNISOLONE ACETATE 80 MG/ML IJ SUSP
INTRAMUSCULAR | Status: DC | PRN
  Administered 2011-12-03: 80 mg

## 2011-12-03 MED ORDER — ONDANSETRON HCL 4 MG/2ML IJ SOLN
INTRAMUSCULAR | Status: DC | PRN
  Administered 2011-12-03: 4 mg via INTRAVENOUS

## 2011-12-03 MED ORDER — BISACODYL 10 MG RE SUPP: 10.0000 mg | Freq: Every day | RECTAL | Status: DC | PRN

## 2011-12-03 MED ORDER — LACTATED RINGERS IV SOLN
INTRAVENOUS | Status: DC | PRN
  Administered 2011-12-03 (×2): via INTRAVENOUS

## 2011-12-03 MED ORDER — SENNOSIDES-DOCUSATE SODIUM 8.6-50 MG PO TABS
1.0000 | ORAL_TABLET | Freq: Every evening | ORAL | Status: DC | PRN
  Filled 2011-12-03: qty 1

## 2011-12-03 MED ORDER — KETOROLAC TROMETHAMINE 30 MG/ML IJ SOLN
INTRAMUSCULAR | Status: AC
  Filled 2011-12-03: qty 1

## 2011-12-03 MED ORDER — SODIUM CHLORIDE 0.9 % IV SOLN
INTRAVENOUS | Status: AC
  Filled 2011-12-03: qty 500

## 2011-12-03 MED ORDER — PROPOFOL 10 MG/ML IV EMUL
INTRAVENOUS | Status: DC | PRN
  Administered 2011-12-03: 120 mg via INTRAVENOUS

## 2011-12-03 MED ORDER — HYDROCODONE-ACETAMINOPHEN 5-325 MG PO TABS: 1.0000 | ORAL_TABLET | ORAL | Status: DC | PRN

## 2011-12-03 MED ORDER — FENTANYL CITRATE 0.05 MG/ML IJ SOLN
INTRAMUSCULAR | Status: DC | PRN
  Administered 2011-12-03: 150 ug via INTRAVENOUS
  Administered 2011-12-03 (×2): 50 ug via INTRAVENOUS

## 2011-12-03 MED ORDER — PHENOL 1.4 % MT LIQD: 1.0000 | OROMUCOSAL | Status: DC | PRN

## 2011-12-03 MED ORDER — HYDROMORPHONE HCL PF 1 MG/ML IJ SOLN
INTRAMUSCULAR | Status: AC
  Administered 2011-12-03: 0.5 mg
  Filled 2011-12-03: qty 1

## 2011-12-03 MED ORDER — DEXAMETHASONE SODIUM PHOSPHATE 4 MG/ML IJ SOLN
INTRAMUSCULAR | Status: DC | PRN
  Administered 2011-12-03: 4 mg via INTRAVENOUS

## 2011-12-03 MED ORDER — KETOROLAC TROMETHAMINE 30 MG/ML IJ SOLN
30.0000 mg | Freq: Four times a day (QID) | INTRAMUSCULAR | Status: DC
  Administered 2011-12-03 – 2011-12-04 (×2): 30 mg via INTRAVENOUS
  Filled 2011-12-03 (×6): qty 1

## 2011-12-03 MED ORDER — BACITRACIN 50000 UNITS IM SOLR
INTRAMUSCULAR | Status: AC
  Filled 2011-12-03: qty 1

## 2011-12-03 MED ORDER — PREDNISONE (PAK) 5 MG PO TABS: 5.0000 mg | ORAL_TABLET | ORAL | Status: DC

## 2011-12-03 MED ORDER — TRAMADOL HCL 50 MG PO TABS
50.0000 mg | ORAL_TABLET | Freq: Four times a day (QID) | ORAL | Status: DC
  Administered 2011-12-03 – 2011-12-04 (×2): 50 mg via ORAL
  Filled 2011-12-03 (×6): qty 1

## 2011-12-03 MED ORDER — LIDOCAINE-EPINEPHRINE 1 %-1:100000 IJ SOLN
INTRAMUSCULAR | Status: DC | PRN
  Administered 2011-12-03: 5 mL

## 2011-12-03 MED ORDER — KETOROLAC TROMETHAMINE 30 MG/ML IJ SOLN
30.0000 mg | Freq: Once | INTRAMUSCULAR | Status: AC
  Administered 2011-12-03: 30 mg via INTRAVENOUS

## 2011-12-03 MED ORDER — LORATADINE 10 MG PO TABS
10.0000 mg | ORAL_TABLET | Freq: Every day | ORAL | Status: DC
  Filled 2011-12-03: qty 1

## 2011-12-03 MED ORDER — 0.9 % SODIUM CHLORIDE (POUR BTL) OPTIME
TOPICAL | Status: DC | PRN
  Administered 2011-12-03: 1000 mL

## 2011-12-03 MED ORDER — SODIUM CHLORIDE 0.9 % IJ SOLN: 3.0000 mL | Freq: Two times a day (BID) | INTRAMUSCULAR | Status: DC

## 2011-12-03 MED ORDER — ROCURONIUM BROMIDE 100 MG/10ML IV SOLN
INTRAVENOUS | Status: DC | PRN
  Administered 2011-12-03: 50 mg via INTRAVENOUS

## 2011-12-03 SURGICAL SUPPLY — 57 items
BAG DECANTER FOR FLEXI CONT (MISCELLANEOUS) ×2 IMPLANT
BENZOIN TINCTURE PRP APPL 2/3 (GAUZE/BANDAGES/DRESSINGS) IMPLANT
BIT DRILL NEURO 2X3.1 SFT TUCH (MISCELLANEOUS) ×1 IMPLANT
BLADE SURG ROTATE 9660 (MISCELLANEOUS) IMPLANT
BUR ROUND FLUTED 5 RND (BURR) ×2 IMPLANT
CANISTER SUCTION 2500CC (MISCELLANEOUS) ×2 IMPLANT
CLOTH BEACON ORANGE TIMEOUT ST (SAFETY) ×2 IMPLANT
CONT SPEC 4OZ CLIKSEAL STRL BL (MISCELLANEOUS) ×2 IMPLANT
DERMABOND ADVANCED (GAUZE/BANDAGES/DRESSINGS) ×1
DERMABOND ADVANCED .7 DNX12 (GAUZE/BANDAGES/DRESSINGS) ×1 IMPLANT
DRAPE LAPAROTOMY 100X72X124 (DRAPES) ×2 IMPLANT
DRAPE MICROSCOPE LEICA (MISCELLANEOUS) ×2 IMPLANT
DRAPE POUCH INSTRU U-SHP 10X18 (DRAPES) ×2 IMPLANT
DRAPE SURG 17X23 STRL (DRAPES) ×2 IMPLANT
DRESSING TELFA 8X3 (GAUZE/BANDAGES/DRESSINGS) IMPLANT
DRILL NEURO 2X3.1 SOFT TOUCH (MISCELLANEOUS) ×2
DURAPREP 26ML APPLICATOR (WOUND CARE) ×2 IMPLANT
ELECT REM PT RETURN 9FT ADLT (ELECTROSURGICAL) ×2
ELECTRODE REM PT RTRN 9FT ADLT (ELECTROSURGICAL) ×1 IMPLANT
GAUZE SPONGE 4X4 16PLY XRAY LF (GAUZE/BANDAGES/DRESSINGS) IMPLANT
GLOVE BIO SURGEON STRL SZ8 (GLOVE) ×2 IMPLANT
GLOVE BIOGEL PI IND STRL 7.5 (GLOVE) ×1 IMPLANT
GLOVE BIOGEL PI IND STRL 8 (GLOVE) ×1 IMPLANT
GLOVE BIOGEL PI IND STRL 8.5 (GLOVE) ×1 IMPLANT
GLOVE BIOGEL PI INDICATOR 7.5 (GLOVE) ×1
GLOVE BIOGEL PI INDICATOR 8 (GLOVE) ×1
GLOVE BIOGEL PI INDICATOR 8.5 (GLOVE) ×1
GLOVE ECLIPSE 7.5 STRL STRAW (GLOVE) ×6 IMPLANT
GLOVE ECLIPSE 8.0 STRL XLNG CF (GLOVE) ×2 IMPLANT
GLOVE ECLIPSE 8.5 STRL (GLOVE) ×2 IMPLANT
GLOVE EXAM NITRILE LRG STRL (GLOVE) IMPLANT
GLOVE EXAM NITRILE MD LF STRL (GLOVE) IMPLANT
GLOVE EXAM NITRILE XL STR (GLOVE) IMPLANT
GLOVE EXAM NITRILE XS STR PU (GLOVE) IMPLANT
GOWN BRE IMP SLV AUR LG STRL (GOWN DISPOSABLE) ×2 IMPLANT
GOWN BRE IMP SLV AUR XL STRL (GOWN DISPOSABLE) ×6 IMPLANT
GOWN STRL REIN 2XL LVL4 (GOWN DISPOSABLE) ×2 IMPLANT
KIT BASIN OR (CUSTOM PROCEDURE TRAY) ×2 IMPLANT
KIT ROOM TURNOVER OR (KITS) ×2 IMPLANT
NEEDLE HYPO 18GX1.5 BLUNT FILL (NEEDLE) ×2 IMPLANT
NEEDLE HYPO 25X1 1.5 SAFETY (NEEDLE) ×2 IMPLANT
NS IRRIG 1000ML POUR BTL (IV SOLUTION) ×2 IMPLANT
PACK LAMINECTOMY NEURO (CUSTOM PROCEDURE TRAY) ×2 IMPLANT
PAD ARMBOARD 7.5X6 YLW CONV (MISCELLANEOUS) ×6 IMPLANT
RUBBERBAND STERILE (MISCELLANEOUS) ×4 IMPLANT
SPONGE GAUZE 4X4 12PLY (GAUZE/BANDAGES/DRESSINGS) IMPLANT
SPONGE SURGIFOAM ABS GEL SZ50 (HEMOSTASIS) ×2 IMPLANT
STRIP CLOSURE SKIN 1/2X4 (GAUZE/BANDAGES/DRESSINGS) IMPLANT
SUT VIC AB 0 CT1 18XCR BRD8 (SUTURE) ×1 IMPLANT
SUT VIC AB 0 CT1 8-18 (SUTURE) ×1
SUT VIC AB 2-0 CT1 18 (SUTURE) ×2 IMPLANT
SUT VIC AB 3-0 SH 8-18 (SUTURE) ×2 IMPLANT
SYR 20ML ECCENTRIC (SYRINGE) ×2 IMPLANT
SYR 5ML LL (SYRINGE) ×2 IMPLANT
TOWEL OR 17X24 6PK STRL BLUE (TOWEL DISPOSABLE) ×2 IMPLANT
TOWEL OR 17X26 10 PK STRL BLUE (TOWEL DISPOSABLE) ×2 IMPLANT
WATER STERILE IRR 1000ML POUR (IV SOLUTION) ×2 IMPLANT

## 2011-12-03 NOTE — Progress Notes (Signed)
Patient is awake, alert, conversant.  Full strength bilateral plantar flexion and dorsiflexion, EHL, no numbness.

## 2011-12-03 NOTE — Interval H&P Note (Signed)
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 After consideration of risks, benefits and other options for treatment, the patient has consented to  Procedure(s) (LRB): LUMBAR LAMINECTOMY/DECOMPRESSION MICRODISCECTOMY 1 LEVEL (Left) as a surgical intervention .  The patient's history has been reviewed, patient examined, no change in status, stable for surgery.  I have reviewed the patient's chart and labs.  Questions were answered to the patient's satisfaction.     Beth Spackman D  Date of Initial H&P: 12/02/2011  History reviewed, patient examined, no change in status, stable for surgery.

## 2011-12-03 NOTE — Anesthesia Preprocedure Evaluation (Signed)
Anesthesia Evaluation  Patient identified by MRN, date of birth, ID band Patient awake    Reviewed: Allergy & Precautions, H&P , NPO status , Patient's Chart, lab work & pertinent test results  Airway       Dental  (+) Dental Advisory Given   Pulmonary          Cardiovascular     Neuro/Psych  Headaches,    GI/Hepatic   Endo/Other  Morbid obesity  Renal/GU      Musculoskeletal   Abdominal   Peds  Hematology   Anesthesia Other Findings   Reproductive/Obstetrics                           Anesthesia Physical Anesthesia Plan  ASA: II  Anesthesia Plan: General   Post-op Pain Management:    Induction: Intravenous  Airway Management Planned: Oral ETT  Additional Equipment:   Intra-op Plan:   Post-operative Plan: Extubation in OR  Informed Consent: I have reviewed the patients History and Physical, chart, labs and discussed the procedure including the risks, benefits and alternatives for the proposed anesthesia with the patient or authorized representative who has indicated his/her understanding and acceptance.     Plan Discussed with: Anesthesiologist and Surgeon  Anesthesia Plan Comments:         Anesthesia Quick Evaluation

## 2011-12-03 NOTE — Transfer of Care (Signed)
Immediate Anesthesia Transfer of Care Note  Patient: [name]  Procedure(s) Performed: Procedure(s) (LRB): LUMBAR LAMINECTOMY/DECOMPRESSION MICRODISCECTOMY 1 LEVEL (Left)  Patient Location: PACU  Anesthesia Type: General  Level of Consciousness: awake, alert  and oriented  Airway & Oxygen Therapy: Patient Spontanous Breathing and Patient connected to nasal cannula oxygen  Post-op Assessment: Report given to PACU RN, Post -op Vital signs reviewed and stable and Patient moving all extremities X 4  Post vital signs: Reviewed and stable  Complications: No apparent anesthesia complications

## 2011-12-03 NOTE — Anesthesia Postprocedure Evaluation (Signed)
  Anesthesia Post-op Note  Patient: [name]  Procedure(s) Performed: Procedure(s) (LRB): LUMBAR LAMINECTOMY/DECOMPRESSION MICRODISCECTOMY 1 LEVEL (Left)  Patient Location: PACU  Anesthesia Type: General  Level of Consciousness: awake, alert  and oriented  Airway and Oxygen Therapy: Patient Spontanous Breathing and Patient connected to nasal cannula oxygen  Post-op Pain: mild  Post-op Assessment: Post-op Vital signs reviewed and Patient's Cardiovascular Status Stable  Post-op Vital Signs: stable  Complications: No apparent anesthesia complications

## 2011-12-03 NOTE — Preoperative (Signed)
Beta Blockers   Reason not to administer Beta Blockers:Not Applicable 

## 2011-12-03 NOTE — Op Note (Signed)
12/03/2011  5:41 PM  PATIENT:  [name]  31 y.o. female  PRE-OPERATIVE DIAGNOSIS:  Left lumbar four-five herniated disc, spondylosis, degenerative disc disease, radiculopathy  POST-OPERATIVE DIAGNOSIS:  Left lumbar four-five herniated disc, spondylosis, degenerative disc disease, radiculopathy  PROCEDURE:  Procedure(s) (LRB): LUMBAR LAMINECTOMY/DECOMPRESSION MICRODISCECTOMY 1 LEVEL (Left) L4/5 with microdissection  SURGEON:  Surgeon(s) and Role:    * Maeola Harman, MD - Primary    * Reinaldo Meeker, MD - Assisting  PHYSICIAN ASSISTANT:   ASSISTANTS: none   ANESTHESIA:   general  EBL:  Total I/O In: 1000 [I.V.:1000] Out: 25 [Blood:25]  BLOOD ADMINISTERED:none  DRAINS: none   LOCAL MEDICATIONS USED:  LIDOCAINE   SPECIMEN:  No Specimen  DISPOSITION OF SPECIMEN:  N/A  COUNTS:  YES  TOURNIQUET:  * No tourniquets in log *  DICTATION: DICTATION: Patient has a large L4-5 disc rupture on the left with significant left leg weakness. It was elected to take her to surgery for left L4-5 microdiscectomy.  Procedure: Patient was brought to the operating room and following the smooth and uncomplicated induction of general endotracheal anesthesia she was placed in a prone position on the Wilson frame. Low back was prepped and draped in the usual sterile fashion with DuraPrep. Area of planned incision was infiltrated with local lidocaine. Incision was made in the midline and carried to the lumbodorsal fascia which was incised on the left side of midline. Subperiosteal dissection was performed exposing what was felt to be L45 level. Intraoperative x-ray demonstrated marker probes at L4/5 and L5/S1 levels.  A hemi-semi-laminectomy of L4 was performed a high-speed drill and completed with Kerrison rongeurs and a generous foraminotomy was performed overlying the superior aspect of the L5 lamina. Ligamentum flavum was detached and removed in a piecemeal fashion and the L5 nerve root was  decompressed laterally with removal of the superior aspect of the facet and ligamentum causing nerve root compression. The microscope was brought into the field and the L5 nerve root was mobilized medially. This exposed a large amount of soft disc material and a free fragment of herniated disc material. Multiple fragments were removed and these extended into the interspace which appeared to be quite soft with a disrupted annulus overlying the interspace. As a result it was elected to further decompress the interspace and remove loose disc material and this was done with a variety Epstein curettes and pituitary rongeurs. The redundant annulus was also removed with 2 mm Kerrison rongeur.  At this point it was felt that all neural elements were well decompressed and there was no evidence of residual loose disc material within the interspace. The interspace was then irrigated with bacitracin saline and no additional disc material was mobilized. Hemostasis was assured with bipolar electrocautery and the interspace was irrigated with Depo-Medrol and fentanyl. The lumbodorsal fascia was closed with 0 Vicryl sutures the subcutaneous tissues reapproximated 2-0 Vicryl inverted sutures and the skin edges were reapproximated with 3-0 Vicryl subcuticular stitch. The wound is dressed with Dermabond. Patient was extubated in the operating room and taken to recovery in stable and satisfactory condition having tolerated her operation well. Counts were correct at the end of the case.  PLAN OF CARE: Admit for overnight observation  PATIENT DISPOSITION:  PACU - hemodynamically stable.   Delay start of Pharmacological VTE agent (>24hrs) due to surgical blood loss or risk of bleeding: yes

## 2011-12-03 NOTE — Anesthesia Procedure Notes (Signed)
Procedure Name: Intubation Date/Time: 12/03/2011 3:58 PM Performed by: Elon Alas Pre-anesthesia Checklist: Timeout performed, Patient identified, Emergency Drugs available, Suction available and Patient being monitored Patient Re-evaluated:Patient Re-evaluated prior to inductionOxygen Delivery Method: Circle system utilized Preoxygenation: Pre-oxygenation with 100% oxygen Intubation Type: IV induction Ventilation: Mask ventilation without difficulty Laryngoscope Size: Mac and 3 Grade View: Grade I Tube type: Oral Tube size: 7.5 mm Number of attempts: 1 Airway Equipment and Method: Stylet Placement Confirmation: positive ETCO2,  ETT inserted through vocal cords under direct vision and breath sounds checked- equal and bilateral Secured at: 22 cm Tube secured with: Tape Dental Injury: Teeth and Oropharynx as per pre-operative assessment

## 2011-12-04 ENCOUNTER — Encounter (HOSPITAL_COMMUNITY): Payer: Self-pay | Admitting: Neurosurgery

## 2011-12-04 NOTE — Discharge Summary (Signed)
Physician Discharge Summary  Patient ID: [name] MRN: [id number] DOB/AGE: July 25, 1980 31 y.o.  Admit date: 12/03/2011 Discharge date: 12/04/2011  Admission Diagnoses: Left lumbar four-five herniated disc, spondylosis, degenerative disc disease, radiculopathy    Discharge Diagnoses:  Left lumbar four-five herniated disc, spondylosis, degenerative disc disease, radiculopathy s/p LUMBAR LAMINECTOMY/DECOMPRESSION MICRODISCECTOMY 1 LEVEL (Left) L4/5 with microdissection  Active Problems:  * No active hospital problems. *    Discharged Condition: good  Hospital Course: [name] was admitted 12-03-11 with Dx of left lumbar 4-5 herniated disc, spondylosis, and radiculopathy. Following an uncomplicated lumbar laminectomy, microdiscectomy, she recovered in [facility] before transferring to 3500 for overnight observation.  Consults: None  Significant Diagnostic Studies: radiology: X-Ray: intra-operative  Treatments: surgery: LUMBAR LAMINECTOMY/DECOMPRESSION MICRODISCECTOMY 1 LEVEL (Left) L4/5 with microdissection   Discharge Exam: Blood pressure 100/66, pulse 81, temperature 98.6 F (37 C), temperature source Oral, resp. rate 18, height 5' 2.99" (1.6 m), weight 94.8 kg (208 lb 15.9 oz), last menstrual period 11/02/2011, SpO2 99.00%. Alert, conversant. Mother present. Incision with Dermabond. No erythema, swelling,or drainage. Good strength BLE, ambulating in hall without difficulty. Pt reports only mild "soreness" in low back at present. No leg pain or numbness.   Disposition: D/C to home. Rx's given: Percocet 5/325 1po q 4-6 hrs prn pain #50. Valium 5mg  1poq8hrs prn spasm #50. Pt will call office to schedule 3week f/u with Dr. Venetia Maxon. Pt and mother verbalize understanding of d/c instructions.  Discharge Orders    Future Appointments: Provider: Department: Dept Phone: Center:   03/23/2012 8:45 AM Kerri Perches, MD [facility] [phone] [facility]     Medication List   As of 12/04/2011  7:59 AM   ASK your doctor about these medications         acetaminophen 500 MG tablet   Commonly known as: TYLENOL   Take 500 mg by mouth every 6 (six) hours as needed. 4 tabs per week for back pain      ibuprofen 800 MG tablet   Commonly known as: ADVIL,MOTRIN   Take 1 tablet (800 mg total) by mouth every 8 (eight) hours as needed for pain.      loratadine 10 MG tablet   Commonly known as: CLARITIN   Take 10 mg by mouth daily.      phentermine 37.5 MG tablet   Commonly known as: ADIPEX-P   Take 1 tablet (37.5 mg total) by mouth daily before breakfast.      phentermine 37.5 MG tablet   Commonly known as: ADIPEX-P   Take 1 tablet (37.5 mg total) by mouth daily before breakfast.      predniSONE 5 MG Tabs   Commonly known as: STERAPRED UNI-PAK   Take 1 tablet (5 mg total) by mouth as directed.      traMADol 50 MG tablet   Commonly known as: ULTRAM   Take 1 tablet (50 mg total) by mouth every 8 (eight) hours as needed for pain.      Vitamin D 2000 UNITS Caps   Take 1 capsule by mouth daily.             Signed: Georgiann Cocker 12/04/2011, 7:59 AM

## 2011-12-08 NOTE — Discharge Summary (Signed)
As above.

## 2011-12-09 ENCOUNTER — Encounter (HOSPITAL_COMMUNITY): Payer: Self-pay

## 2011-12-20 ENCOUNTER — Other Ambulatory Visit: Payer: Self-pay

## 2011-12-20 DIAGNOSIS — M5090 Cervical disc disorder, unspecified, unspecified cervical region: Secondary | ICD-10-CM

## 2011-12-20 MED ORDER — TRAMADOL HCL 50 MG PO TABS: 50.0000 mg | ORAL_TABLET | Freq: Three times a day (TID) | ORAL | Status: DC | PRN

## 2012-02-19 ENCOUNTER — Other Ambulatory Visit: Payer: Self-pay | Admitting: Adult Health

## 2012-02-19 ENCOUNTER — Other Ambulatory Visit (HOSPITAL_COMMUNITY)
Admission: RE | Admit: 2012-02-19 | Discharge: 2012-02-19 | Disposition: A | Discharge status: Home / Self Care | Payer: 59 | Source: Ambulatory Visit | Attending: Obstetrics and Gynecology | Admitting: Obstetrics and Gynecology

## 2012-02-19 DIAGNOSIS — Z01419 Encounter for gynecological examination (general) (routine) without abnormal findings: Secondary | ICD-10-CM | POA: Insufficient documentation

## 2012-03-23 ENCOUNTER — Encounter: Payer: Self-pay | Admitting: Family Medicine

## 2012-03-23 ENCOUNTER — Ambulatory Visit (INDEPENDENT_AMBULATORY_CARE_PROVIDER_SITE_OTHER): Payer: 59 | Admitting: Family Medicine

## 2012-03-23 VITALS — BP 116/68 | HR 99 | Resp 18 | Ht 63.0 in | Wt 219.1 lb

## 2012-03-23 DIAGNOSIS — M79605 Pain in left leg: Secondary | ICD-10-CM

## 2012-03-23 DIAGNOSIS — M545 Low back pain, unspecified: Secondary | ICD-10-CM

## 2012-03-23 DIAGNOSIS — Z13220 Encounter for screening for lipoid disorders: Secondary | ICD-10-CM

## 2012-03-23 DIAGNOSIS — R5381 Other malaise: Secondary | ICD-10-CM

## 2012-03-23 DIAGNOSIS — E669 Obesity, unspecified: Secondary | ICD-10-CM

## 2012-03-23 DIAGNOSIS — R5383 Other fatigue: Secondary | ICD-10-CM

## 2012-03-23 DIAGNOSIS — J301 Allergic rhinitis due to pollen: Secondary | ICD-10-CM

## 2012-03-23 NOTE — Patient Instructions (Addendum)
F/u in 4 month.Call if you need me before  Weight loss goal of 10 pounds in the next 4 month  I am sorry about recent family losses  Please sign into my chart to access labs and radiology reports.   It is important that you exercise regularly at least 30 minutes 5 times a week. If you develop chest pain, have severe difficulty breathing, or feel very tired, stop exercising immediately and seek medical attention   A healthy diet is rich in fruit, vegetables and whole grains. Poultry fish, nuts and beans are a healthy choice for protein rather then red meat. A low sodium diet and drinking 64 ounces of water daily is generally recommended. Oils and sweet should be limited. Carbohydrates especially for those who are diabetic or overweight, should be limited to 30-45 gram per meal. It is important to eat on a regular schedule, at least 3 times daily. Snacks should be primarily fruits, vegetables or nuts.  Fasting cbc, fasting lipid, cmp, HBA1C, TSH , vit D as soon as possible.

## 2012-03-23 NOTE — Progress Notes (Signed)
  Subjective:    Patient ID: [name], female    DOB: 1980/06/22, 31 y.o.   MRN: [id number]  HPI The PT is here for follow up and re-evaluation of chronic medical conditions, medication management and review of any available recent lab and radiology data.  Preventive health is updated, specifically  Cancer screening and Immunization.   Pt had succesful lumbar laminectomy since last visit, has less pain and is doing better physically. Multiple unexpected deaths in rapid sequence of close family members, as well as an upcoming change in her work schedule , and the fact that the children are "growing up too fast" have her feeling somewhat overwhelmed . She has fallen off the calorie counting, has gained weight , and does not sound excessively hopeful at this  time as far as her ability to change the direction of this     Review of Systems See HPI Denies recent fever or chills. Denies sinus pressure, nasal congestion, ear pain or sore throat. Denies chest congestion, productive cough or wheezing. Denies chest pains, palpitations and leg swelling Denies abdominal pain, nausea, vomiting,diarrhea or constipation.   Denies dysuria, frequency, hesitancy or incontinence. Denies joint pain, swelling and limitation in mobility. Denies headaches, seizures, numbness, or tingling. Denies uncontrolled  depression, anxiety or insomnia. Denies skin break down or rash.        Objective:   Physical Exam Patient alert and oriented and in no cardiopulmonary distress.  HEENT: No facial asymmetry, EOMI, no sinus tenderness,  oropharynx pink and moist.  Neck supple no adenopathy.  Chest: Clear to auscultation bilaterally.  CVS: S1, S2 no murmurs, no S3.  ABD: Soft non tender. Bowel sounds normal.  Ext: No edema  MS: Adequate ROM spine, shoulders, hips and knees.  Skin: Intact, no ulcerations or rash noted.  Psych: Good eye contact, normal affect. Memory intact not anxious or depressed  appearing.  CNS: CN 2-12 intact, power, tone and sensation normal throughout.        Assessment & Plan:

## 2012-03-23 NOTE — Assessment & Plan Note (Signed)
Deteriorated. Patient re-educated about  the importance of commitment to a  minimum of 150 minutes of exercise per week. The importance of healthy food choices with portion control discussed. Encouraged to start a food diary, count calories and to consider  joining a support group. Sample diet sheets offered. Goals set by the patient for the next several months.    

## 2012-03-23 NOTE — Assessment & Plan Note (Signed)
Controlled, no change in medication  

## 2012-03-23 NOTE — Assessment & Plan Note (Signed)
Improved since surgery

## 2012-07-22 ENCOUNTER — Telehealth: Payer: Self-pay | Admitting: Family Medicine

## 2012-07-22 ENCOUNTER — Other Ambulatory Visit: Payer: Self-pay | Admitting: Family Medicine

## 2012-07-22 ENCOUNTER — Ambulatory Visit (INDEPENDENT_AMBULATORY_CARE_PROVIDER_SITE_OTHER): Payer: 59 | Admitting: Family Medicine

## 2012-07-22 ENCOUNTER — Encounter: Payer: Self-pay | Admitting: Family Medicine

## 2012-07-22 VITALS — BP 118/82 | HR 78 | Resp 15 | Ht 63.0 in | Wt 220.0 lb

## 2012-07-22 DIAGNOSIS — E785 Hyperlipidemia, unspecified: Secondary | ICD-10-CM

## 2012-07-22 DIAGNOSIS — E669 Obesity, unspecified: Secondary | ICD-10-CM

## 2012-07-22 DIAGNOSIS — R002 Palpitations: Secondary | ICD-10-CM | POA: Insufficient documentation

## 2012-07-22 DIAGNOSIS — K3189 Other diseases of stomach and duodenum: Secondary | ICD-10-CM

## 2012-07-22 DIAGNOSIS — R1013 Epigastric pain: Secondary | ICD-10-CM

## 2012-07-22 DIAGNOSIS — K219 Gastro-esophageal reflux disease without esophagitis: Secondary | ICD-10-CM | POA: Insufficient documentation

## 2012-07-22 DIAGNOSIS — J301 Allergic rhinitis due to pollen: Secondary | ICD-10-CM

## 2012-07-22 LAB — LIPID PANEL
Cholesterol: 229 mg/dL — ABNORMAL HIGH (ref 0–200)
HDL: 60 mg/dL (ref >39)
LDL Cholesterol: 150 mg/dL — ABNORMAL HIGH (ref 0–99)
Total CHOL/HDL Ratio: 3.8 Ratio
Triglycerides: 93 mg/dL (ref <150)
VLDL: 19 mg/dL (ref 0–40)

## 2012-07-22 LAB — HEMOGLOBIN A1C
Hgb A1c MFr Bld: 5.5 % (ref <5.7)
Mean Plasma Glucose: 111 mg/dL (ref <117)

## 2012-07-22 LAB — COMPREHENSIVE METABOLIC PANEL
ALT: 26 U/L (ref 0–35)
AST: 18 U/L (ref 0–37)
Albumin: 4.4 g/dL (ref 3.5–5.2)
Alkaline Phosphatase: 90 U/L (ref 39–117)
BUN: 12 mg/dL (ref 6–23)
CO2: 24 mEq/L (ref 19–32)
Calcium: 10.1 mg/dL (ref 8.4–10.5)
Chloride: 103 mEq/L (ref 96–112)
Creat: 0.67 mg/dL (ref 0.50–1.10)
Glucose, Bld: 87 mg/dL (ref 70–99)
Potassium: 4.5 mEq/L (ref 3.5–5.3)
Sodium: 139 mEq/L (ref 135–145)
Total Bilirubin: 0.2 mg/dL — ABNORMAL LOW (ref 0.3–1.2)
Total Protein: 7.4 g/dL (ref 6.0–8.3)

## 2012-07-22 LAB — CBC
HCT: 43.1 % (ref 36.0–46.0)
Hemoglobin: 14.5 g/dL (ref 12.0–15.0)
MCH: 28.3 pg (ref 26.0–34.0)
MCHC: 33.6 g/dL (ref 30.0–36.0)
MCV: 84.2 fL (ref 78.0–100.0)
Platelets: 353 10*3/uL (ref 150–400)
RBC: 5.12 MIL/uL — ABNORMAL HIGH (ref 3.87–5.11)
RDW: 14.3 % (ref 11.5–15.5)
WBC: 10.7 10*3/uL — ABNORMAL HIGH (ref 4.0–10.5)

## 2012-07-22 LAB — TSH: TSH: 2.971 u[IU]/mL (ref 0.350–4.500)

## 2012-07-22 MED ORDER — PANTOPRAZOLE SODIUM 40 MG PO TBEC: 40.0000 mg | DELAYED_RELEASE_TABLET | Freq: Every day | ORAL | Status: DC

## 2012-07-22 MED ORDER — EPINEPHRINE 0.3 MG/0.3ML IJ DEVI: INTRAMUSCULAR | Status: AC

## 2012-07-22 NOTE — Patient Instructions (Addendum)
F/u in 4 month  You are referred for [place] gall bladder, if normal you will also be referred for a HIDA scan  You are referred to cardiology for evaluation of irregular heart rate.  Epi pen prescribed to be used only in emergency  You need to start a pPI, prtotonix will be [facility]  It is important that you exercise regularly at least 30 minutes 5 times a week. If you develop chest pain, have severe difficulty breathing, or feel very tired, stop exercising immediately and seek medical attention    A healthy diet is rich in fruit, vegetables and whole grains. Poultry fish, nuts and beans are a healthy choice for protein rather then red meat. A low sodium diet and drinking 64 ounces of water daily is generally recommended. Oils and sweet should be limited. Carbohydrates especially for those who are diabetic or overweight, should be limited to 34-45 gram per meal. It is important to eat on a regular schedule, at least 3 times daily. Snacks should be primarily fruits, vegetables or nuts.  Call back re GI eval

## 2012-07-22 NOTE — Telephone Encounter (Signed)
Patient is aware 

## 2012-07-22 NOTE — Progress Notes (Signed)
  Subjective:    Patient ID: [name], female    DOB: September 30, 1980, 32 y.o.   MRN: [id number]  HPI  Burning pain epigastric woke her at 1am  Had mild relief with vicodin 8 hrs later, intermittent nausea, bloating , belching, no fever, had chills, no vomit  diarheah on Sat till lunch started in Feb 2014, has been awakened 3 times since then Eating on the road 2 weeks ago , ate at [employer]  , had allergic reaction when throat nearly closed up, had rash also, known fish allergy.  Irregular heart rate for approx 10 years, or more, on no stimulants, increased  Frequency,in the past 6 month on avg 5 days per week, duration varies from 2 minutes up to 1 hour , sometimes light headed, no chest tightness , nausea or sweating, does eport increased pesonal stress, multiple deaths in the family and Mom's health deteriorating   Review of Systems    See HPI Denies recent fever or chills. Denies sinus pressure, nasal congestion, ear pain or sore throat. Denies chest congestion, productive cough or wheezing. Denies PND, orthopnea  and leg swelling    Denies dysuria, frequency, hesitancy or incontinence. Denies joint pain, swelling and limitation in mobility.Marked improvement following surgery Denies headaches, seizures, numbness, or tingling. Denies depression,or insomnia. Denies skin break down or rash.     Objective:   Physical Exam  Patient alert and oriented and in no cardiopulmonary distress.  HEENT: No facial asymmetry, EOMI, no sinus tenderness,  oropharynx pink and moist.  Neck supple no adenopathy.  Chest: Clear to auscultation bilaterally.  CVS: S1, S2 no murmurs, no S3.Regular rate and rythm  ABD: Soft mild epigastric tenderness. Bowel sounds normal.No guarding or rebound, bowel sounds normal  Ext: No edema  MS: Adequate ROM spine, shoulders, hips and knees.  Skin: Intact, no ulcerations or rash noted.  Psych: Good eye contact, normal affect. Memory intact not anxious or  depressed appearing.  CNS: CN 2-12 intact, power, tone and sensation normal throughout.       Assessment & Plan:

## 2012-07-23 ENCOUNTER — Encounter: Payer: Self-pay | Admitting: Cardiology

## 2012-07-23 ENCOUNTER — Ambulatory Visit (INDEPENDENT_AMBULATORY_CARE_PROVIDER_SITE_OTHER): Payer: 59 | Admitting: Cardiology

## 2012-07-23 VITALS — BP 118/66 | HR 76 | Ht 63.0 in | Wt 221.0 lb

## 2012-07-23 DIAGNOSIS — Z9189 Other specified personal risk factors, not elsewhere classified: Secondary | ICD-10-CM

## 2012-07-23 DIAGNOSIS — R002 Palpitations: Secondary | ICD-10-CM

## 2012-07-23 DIAGNOSIS — M5410 Radiculopathy, site unspecified: Secondary | ICD-10-CM

## 2012-07-23 DIAGNOSIS — IMO0002 Reserved for concepts with insufficient information to code with codable children: Secondary | ICD-10-CM

## 2012-07-23 DIAGNOSIS — E669 Obesity, unspecified: Secondary | ICD-10-CM

## 2012-07-23 DIAGNOSIS — E785 Hyperlipidemia, unspecified: Secondary | ICD-10-CM

## 2012-07-23 DIAGNOSIS — Z9289 Personal history of other medical treatment: Secondary | ICD-10-CM | POA: Insufficient documentation

## 2012-07-23 HISTORY — DX: Hyperlipidemia, unspecified: E78.5

## 2012-07-23 LAB — H. PYLORI ANTIBODY, IGG: H Pylori IgG: <0.4 {ISR}

## 2012-07-23 LAB — VITAMIN D 25 HYDROXY (VIT D DEFICIENCY, FRACTURES): Vit D, 25-Hydroxy: 41 ng/mL (ref 30–89)

## 2012-07-23 NOTE — Assessment & Plan Note (Signed)
Moderate hyperlipidemia at a young age in the absence of other cardiovascular risk factors. Pharmacologic therapy will be deferred.

## 2012-07-23 NOTE — Progress Notes (Deleted)
Name: [name]    DOB: 1980-08-05  Age: 32 y.o.  MR#: [id number]       PCP:  Syliva Overman, MD      Insurance: Payor: [employer]  Plan: [employer]  Product Type: *No Product type*    CC:   No chief complaint on file.   VS Filed Vitals:   07/23/12 1112  BP: 118/66  Pulse: 76  Height: 5\' 3"  (1.6 m)  Weight: 221 lb (100.245 kg)    Weights Current Weight  07/23/12 221 lb (100.245 kg)  07/22/12 220 lb (99.791 kg)  03/23/12 219 lb 1.9 oz (99.392 kg)    Blood Pressure  BP Readings from Last 3 Encounters:  07/23/12 118/66  07/22/12 118/82  03/23/12 116/68     Admit date:  (Not on file) Last encounter with RMR:  Visit date not found   Allergy Shellfish allergy; Cetirizine hcl; and Chocolate  Current Outpatient Prescriptions  Medication Sig Dispense Refill  . Cholecalciferol (VITAMIN D) 2000 UNITS CAPS Take 1 capsule by mouth daily.      Marland Kitchen EPINEPHrine (EPI-PEN) 0.3 mg/0.3 mL DEVI Use as directed  1 Device  1  . loratadine (CLARITIN) 10 MG tablet Take 10 mg by mouth daily.        . pantoprazole (PROTONIX) 40 MG tablet Take 1 tablet (40 mg total) by mouth daily.  90 tablet  1  . traMADol (ULTRAM) 50 MG tablet Take 1 tablet (50 mg total) by mouth every 8 (eight) hours as needed for pain.  30 tablet  1   No current facility-administered medications for this visit.    Discontinued Meds:   There are no discontinued medications.  Patient Active Problem List  Diagnosis  . Obesity (BMI 30-39.9)  . ALLERGIC RHINITIS, SEASONAL  . FATIGUE  . Insomnia  . Back pain  . Lumbar pain with radiation down left leg  . Dyspepsia  . Palpitations  . GERD (gastroesophageal reflux disease)    LABS    Component Value Date/Time   NA 139 07/22/2012 0840   NA 141 02/19/2011 0850   NA 134* 07/17/2009 1452   K 4.5 07/22/2012 0840   K 4.5 02/19/2011 0850   K 3.8 07/17/2009 1452   CL 103 07/22/2012 0840   CL 105 02/19/2011 0850   CL 105 07/17/2009 1452   CO2 24 07/22/2012 0840    CO2 27 02/19/2011 0850   CO2 21 07/17/2009 1452   GLUCOSE 87 07/22/2012 0840   GLUCOSE 79 02/19/2011 0850   GLUCOSE 123* 07/17/2009 1452   BUN 12 07/22/2012 0840   BUN 19 02/19/2011 0850   BUN 13 07/17/2009 1452   CREATININE 0.67 07/22/2012 0840   CREATININE 0.66 02/19/2011 0850   CREATININE 0.74 07/17/2009 1452   CALCIUM 10.1 07/22/2012 0840   CALCIUM 9.3 02/19/2011 0850   CALCIUM 9.3 07/17/2009 1452   GFRNONAA >60 07/17/2009 1452   GFRAA  Value: >60        The eGFR has been calculated using the MDRD equation. This calculation has not been validated in all clinical situations. eGFR's persistently <60 mL/min signify possible Chronic Kidney Disease. 07/17/2009 1452   CMP     Component Value Date/Time   NA 139 07/22/2012 0840   K 4.5 07/22/2012 0840   CL 103 07/22/2012 0840   CO2 24 07/22/2012 0840   GLUCOSE 87 07/22/2012 0840   BUN 12 07/22/2012 0840   CREATININE 0.67 07/22/2012 0840   CREATININE  0.74 07/17/2009 1452   CALCIUM 10.1 07/22/2012 0840   PROT 7.4 07/22/2012 0840   ALBUMIN 4.4 07/22/2012 0840   AST 18 07/22/2012 0840   ALT 26 07/22/2012 0840   ALKPHOS 90 07/22/2012 0840   BILITOT 0.2* 07/22/2012 0840   GFRNONAA >60 07/17/2009 1452   GFRAA  Value: >60        The eGFR has been calculated using the MDRD equation. This calculation has not been validated in all clinical situations. eGFR's persistently <60 mL/min signify possible Chronic Kidney Disease. 07/17/2009 1452       Component Value Date/Time   WBC 10.7* 07/22/2012 0840   WBC 13.6* 12/02/2011 1437   WBC 10.4 02/19/2011 0850   HGB 14.5 07/22/2012 0840   HGB 14.3 12/02/2011 1437   HGB 12.9 02/19/2011 0850   HCT 43.1 07/22/2012 0840   HCT 42.6 12/02/2011 1437   HCT 42.8 02/19/2011 0850   MCV 84.2 07/22/2012 0840   MCV 86.9 12/02/2011 1437   MCV 91.8 02/19/2011 0850    Lipid Panel     Component Value Date/Time   CHOL 229* 07/22/2012 0840   TRIG 93 07/22/2012 0840   HDL 60 07/22/2012 0840   CHOLHDL 3.8 07/22/2012 0840   VLDL 19 07/22/2012 0840   LDLCALC 150* 07/22/2012 0840     ABG No results found for this basename: phart, pco2, pco2art, po2, po2art, hco3, tco2, acidbasedef, o2sat     Lab Results  Component Value Date   TSH 2.971 07/22/2012   BNP (last 3 results) No results found for this basename: PROBNP,  in the last 8760 hours Cardiac Panel (last 3 results) No results found for this basename: CKTOTAL, CKMB, TROPONINI, RELINDX,  in the last 72 hours  Iron/TIBC/Ferritin No results found for this basename: iron, tibc, ferritin     EKG Orders placed in visit on 07/23/12  . EKG 12-LEAD     Prior Assessment and Plan Problem List as of 07/23/2012     ICD-9-CM   Obesity (BMI 30-39.9)   Last Assessment & Plan   03/23/2012 Office Visit Written 03/23/2012  1:16 PM by Kerri Perches, MD     Deteriorated. Patient re-educated about  the importance of commitment to a  minimum of 150 minutes of exercise per week. The importance of healthy food choices with portion control discussed. Encouraged to start a food diary, count calories and to consider  joining a support group. Sample diet sheets offered. Goals set by the patient for the next several months.       ALLERGIC RHINITIS, SEASONAL   Last Assessment & Plan   03/23/2012 Office Visit Written 03/23/2012  1:16 PM by Kerri Perches, MD     Controlled, no change in medication     FATIGUE   Insomnia   Last Assessment & Plan   12/05/2010 Office Visit Written 12/17/2010  9:25 PM by Kerri Perches, MD     Improved, and not using prescription med, less stressed, and more active    Back pain   Last Assessment & Plan   07/24/2011 Office Visit Written 07/24/2011  2:36 PM by Kerri Perches, MD     Chronic back pain, wants to go to therapy school, needs PT referral    Lumbar pain with radiation down left leg   Last Assessment & Plan   03/23/2012 Office Visit Written 03/23/2012  1:17 PM by Kerri Perches, MD     Improved since surgery    Dyspepsia  Palpitations   GERD (gastroesophageal reflux  disease)       Imaging: No results found.

## 2012-07-23 NOTE — Patient Instructions (Addendum)
Your physician recommends that you schedule a follow-up appointment in: 1 month  Your physician has recommended that you wear a holter monitor. Holter monitors are medical devices that record the heart's electrical activity. Doctors most often use these monitors to diagnose arrhythmias. Arrhythmias are problems with the speed or rhythm of the heartbeat. The monitor is a small, portable device. You can wear one while you do your normal daily activities. This is usually used to diagnose what is causing palpitations/syncope (passing out).

## 2012-07-23 NOTE — Assessment & Plan Note (Signed)
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

## 2012-07-23 NOTE — Progress Notes (Signed)
Patient ID: [name], female   DOB: February 26, 1981, 32 y.o.   MRN: [id number] HPI: Initial Cardiology evaluation for this 32 year old woman with generally excellent health referred by Kerri Perches, MD for assessment of palpitations.  Patient reports a long history of irregular heartbeats detected as brief palpitations. In recent months, increased frequency has been noted with symptoms occurring on most days, but sometimes absent for up to one week. There no associated symptoms. No prolonged episodes have occurred. She has not previously been evaluated by a cardiologist nor undergone any cardiac testing. Initial laboratory studies are normal.  Current Outpatient Prescriptions on File Prior to Visit  Medication Sig Dispense Refill  . Cholecalciferol (VITAMIN D) 2000 UNITS CAPS Take 1 capsule by mouth daily.      Marland Kitchen EPINEPHrine (EPI-PEN) 0.3 mg/0.3 mL DEVI Use as directed  1 Device  1  . loratadine (CLARITIN) 10 MG tablet Take 10 mg by mouth daily.        . pantoprazole (PROTONIX) 40 MG tablet Take 1 tablet (40 mg total) by mouth daily.  90 tablet  1  . traMADol (ULTRAM) 50 MG tablet Take 1 tablet (50 mg total) by mouth every 8 (eight) hours as needed for pain.  30 tablet  1   No current facility-administered medications on file prior to visit.   Allergies  Allergen Reactions  . Shellfish Allergy Anaphylaxis  . Cetirizine Hcl   . Chocolate Hives    Past Medical History  Diagnosis Date  . Allergy   . Headache     MIGRAINES  -- LAST ONE  09/2011  . Arthritis     BACK  . Palpitations   . Obesity (BMI 30-39.9) 10/09/2009  . Back pain 09/05/2010    Past Surgical History  Procedure Laterality Date  . Tonsillectomy    . Wisdom tooth extraction    . Dilation and curettage of uterus    . Lumbar laminectomy/decompression microdiscectomy  12/03/2011    Procedure: LUMBAR LAMINECTOMY/DECOMPRESSION MICRODISCECTOMY 1 LEVEL;  Surgeon: Maeola Harman, MD;  Location: [facility];  Service:  Neurosurgery;  Laterality: Left;  LEFT Lumbar four-five microdiskectomy    Family History  Problem Relation Age of Onset  . Arthritis Mother     History   Social History  . Marital Status: Married    Spouse Name: N/A    Number of Children: N/A  . Years of Education: N/A   Occupational History  .  [employer]   Social History Main Topics  . Smoking status: Never Smoker   . Smokeless tobacco: Not on file  . Alcohol Use: No  . Drug Use: No  . Sexually Active: Yes     Comment: NOT USING ANY PROTECTION   Other Topics Concern  . Not on file   Social History Narrative  . No narrative on file   ROS: Patient reports generalized lack of energy. She has never been told that she snores at night, has never been evaluated for possible sleep apnea and does not describe extreme daytime somnolence. She experiences intermittent mild pedal edema. She notes occasional pyrosis.  She experiences some low back pain and insomnia and has a history of anemia.  All other systems reviewed and are negative.  PHYSICAL EXAM: BP 118/66  Pulse 76  Ht 5\' 3"  (1.6 m)  Wt 100.245 kg (221 lb)  BMI 39.16 kg/m2;  Body mass index is 39.16 kg/(m^2).   General-Well-developed; no acute distress Body Habitus-moderately to markedly overweight HEENT-[place]/AT; PERRL;  EOM intact; conjunctiva and lids nl Neck-No JVD; no carotid bruits Endocrine-mild diffuse thyromegaly Lungs-Clear lung fields; resonant percussion; normal I-to-E ratio Cardiovascular- normal PMI; normal S1 and S2 Abdomen-BS normal; soft and non-tender without masses or organomegaly Musculoskeletal-No deformities, cyanosis or clubbing Neurologic-Nl cranial nerves; symmetric strength and tone Skin- Warm, no significant lesions Extremities-Nl distal pulses; no edema  EKG:  Normal sinus rhythm; nondiagnostic inferior Q waves; low-voltage; otherwise normal.   [place] Bing, MD 07/23/2012  12:58 PM  ASSESSMENT AND PLAN

## 2012-07-23 NOTE — Assessment & Plan Note (Signed)
Symptoms do not sound particular ominous and likely represent premature ventricular or supraventricular beats. We will start with a 48 hour Holter monitor. If no symptoms occur during that interval, event recording for 3 weeks will be undertaken. I doubt that findings will have significant pathologic implications, but symptomatic treatment may be required.

## 2012-07-24 ENCOUNTER — Other Ambulatory Visit (HOSPITAL_COMMUNITY): Payer: 59

## 2012-07-25 DIAGNOSIS — R1013 Epigastric pain: Secondary | ICD-10-CM | POA: Insufficient documentation

## 2012-07-25 NOTE — Assessment & Plan Note (Signed)
Deteriorated. Patient re-educated about  the importance of commitment to a  minimum of 150 minutes of exercise per week. The importance of healthy food choices with portion control discussed. Encouraged to start a food diary, count calories and to consider  joining a support group. Sample diet sheets offered. Goals set by the patient for the next several months.    

## 2012-07-25 NOTE — Assessment & Plan Note (Signed)
Recurrent epigastric discomfort with bloating and belching eval gallbladder fully, if neg , then GI eval. Pt to take PPI for 2 month based on symptoms

## 2012-07-25 NOTE — Assessment & Plan Note (Signed)
Mild symptoms uses OTC med as needed

## 2012-07-25 NOTE — Assessment & Plan Note (Signed)
Pt to start meds and she is referred for gall bladder eval based on current complaints

## 2012-07-25 NOTE — Assessment & Plan Note (Signed)
Reports increased palpitations inpast month, but gives a "chronic history" will refer for cardiology eval. Regular heart rate at this visit

## 2012-07-25 NOTE — Assessment & Plan Note (Signed)
Deteriorated and uncontrolled, pt needs to focus on dietary change to improve this, no meds at this time

## 2012-07-27 ENCOUNTER — Other Ambulatory Visit: Payer: Self-pay | Admitting: Family Medicine

## 2012-07-27 ENCOUNTER — Ambulatory Visit (HOSPITAL_COMMUNITY)
Admission: RE | Admit: 2012-07-27 | Discharge: 2012-07-27 | Disposition: A | Discharge status: Home / Self Care | Payer: 59 | Source: Ambulatory Visit | Attending: Cardiology | Admitting: Cardiology

## 2012-07-27 ENCOUNTER — Ambulatory Visit (HOSPITAL_COMMUNITY)
Admission: RE | Admit: 2012-07-27 | Discharge: 2012-07-27 | Disposition: A | Discharge status: Home / Self Care | Payer: 59 | Source: Ambulatory Visit | Attending: Family Medicine | Admitting: Family Medicine

## 2012-07-27 DIAGNOSIS — K8020 Calculus of gallbladder without cholecystitis without obstruction: Secondary | ICD-10-CM | POA: Insufficient documentation

## 2012-07-27 DIAGNOSIS — R1013 Epigastric pain: Secondary | ICD-10-CM

## 2012-07-27 DIAGNOSIS — R1011 Right upper quadrant pain: Secondary | ICD-10-CM | POA: Insufficient documentation

## 2012-07-27 DIAGNOSIS — R002 Palpitations: Secondary | ICD-10-CM | POA: Insufficient documentation

## 2012-07-27 NOTE — Progress Notes (Signed)
*  PRELIMINARY RESULTS* Echocardiogram 48H Holter monitor has been performed.  [name] 07/27/2012, 10:04 AM

## 2012-08-01 NOTE — Procedures (Signed)
NAMECLARE, FENNIMORE NO.:  [id number]  MEDICAL RECORD NO.:  [id number]  LOCATION:  CARDIOPU                      FACILITY:  [facility]  PHYSICIAN:  Gerrit Friends. Dietrich Pates, MD, FACCDATE OF BIRTH:  [birth year]  DATE OF PROCEDURE:  07/27/2012 DATE OF DISCHARGE:  07/27/2012                               HOLTER MONITOR   CLINICAL DATA:  A 32 year old woman with palpitations.  1. Continuous electrocardiographic recording was maintained for 47     hours and 53 minutes during which the predominant rhythm was normal     sinus with modest sinus bradycardia to a minimal rate of 55 BPM and     sinus tachycardia to a peak rate of 140 bpm. 2. Fairly frequent ventricular ectopy was noted, occurring at an     average rate of 75 per hour. 3. Rare supraventricular ectopic were recorded at an average rate of 1     per hour. 4. No serious arrhythmias were identified. 5. No significant ST-segment elevation or depression was apparent. 6. Palpitations were reported on three occasions.  During one of     these, normal sinus rhythm was present with an average of     approximately two PVCs per minute.  In the second spell, sinus     tachycardia was present reaching a maximum of 125 beats per minute     with variable numbers of PVCs ranging from 0-6 per minute.  During     the final episode, normal sinus was predominant with sinus     tachycardia to 110 beats per minute, and occasional PVCs at a rate     of approximately 1/3 minutes.  IMPRESSION:  Continuous electrocardiographic recording revealing no serious arrhythmias, but fairly frequent PVCs.  The correlation between symptoms and ventricular ectopy was uncertain.  Other findings as noted.     Gerrit Friends. Dietrich Pates, MD, [facility]     RMR/MEDQ  D:  08/01/2012  T:  08/01/2012  Job:  [id number]

## 2012-08-20 ENCOUNTER — Ambulatory Visit: Payer: 59 | Admitting: Cardiology

## 2012-08-20 ENCOUNTER — Encounter (HOSPITAL_COMMUNITY): Payer: Self-pay | Admitting: Pharmacy Technician

## 2012-08-20 NOTE — Patient Instructions (Addendum)
[name]  08/20/2012   Your procedure is scheduled on:   08/24/2012   Report to [facility] at  750  AM.  Call this number if you have problems the morning of surgery: [phone]   Remember:   Do not eat food or drink liquids after midnight.   Take these medicines the morning of surgery with A SIP OF WATER: claritin,protonix,ultram  Do not wear jewelry, make-up or nail polish.  Do not wear lotions, powders, or perfumes.   Do not shave 48 hours prior to surgery. Men may shave face and neck.  Do not bring valuables to the hospital.  Contacts, dentures or bridgework may not be worn into surgery.  Leave suitcase in the car. After surgery it may be brought to your room.  For patients admitted to the hospital, checkout time is 11:00 AM the day of discharge.   Patients discharged the day of surgery will not be allowed to drive  home.  Name and phone number of your driver: family  Special Instructions: Shower using CHG 2 nights before surgery and the night before surgery.  If you shower the day of surgery use CHG.  Use special wash - you have one bottle of CHG for all showers.  You should use approximately 1/3 of the bottle for each shower.   Please read over the following fact sheets that you were given: Pain Booklet, Coughing and Deep Breathing, MRSA Information, Surgical Site Infection Prevention and Anesthesia Post-op Instructions Laparoscopic Cholecystectomy Laparoscopic cholecystectomy is surgery to remove the gallbladder. The gallbladder is located slightly to the right of center in the abdomen, behind the liver. It is a concentrating and storage sac for the bile produced in the liver. Bile aids in the digestion and absorption of fats. Gallbladder disease (cholecystitis) is an inflammation of your gallbladder. This condition is usually caused by a buildup of gallstones (cholelithiasis) in your gallbladder. Gallstones can block the flow of bile, resulting in inflammation and pain. In  severe cases, emergency surgery may be required. When emergency surgery is not required, you will have time to prepare for the procedure. Laparoscopic surgery is an alternative to open surgery. Laparoscopic surgery usually has a shorter recovery time. Your common bile duct may also need to be examined and explored. Your caregiver will discuss this with you if he or she feels this should be done. If stones are found in the common bile duct, they may be removed. LET YOUR CAREGIVER KNOW ABOUT:  Allergies to food or medicine.  Medicines taken, including vitamins, herbs, eyedrops, over-the-counter medicines, and creams.  Use of steroids (by mouth or creams).  Previous problems with anesthetics or numbing medicines.  History of bleeding problems or blood clots.  Previous surgery.  Other health problems, including diabetes and kidney problems.  Possibility of pregnancy, if this applies. RISKS AND COMPLICATIONS All surgery is associated with risks. Some problems that may occur following this procedure include:  Infection.  Damage to the common bile duct, nerves, arteries, veins, or other internal organs such as the stomach or intestines.  Bleeding.  A stone may remain in the common bile duct. BEFORE THE PROCEDURE  Do not take aspirin for 3 days prior to surgery or blood thinners for 1 week prior to surgery.  Do not eat or drink anything after midnight the night before surgery.  Let your caregiver know if you develop a cold or other infectious problem prior to surgery.  You should be present  60 minutes before the procedure or as directed. PROCEDURE  You will be given medicine that makes you sleep (general anesthetic). When you are asleep, your surgeon will make several small cuts (incisions) in your abdomen. One of these incisions is used to insert a small, lighted scope (laparoscope) into the abdomen. The laparoscope helps the surgeon see into your abdomen. Carbon dioxide gas will be  pumped into your abdomen. The gas allows more room for the surgeon to perform your surgery. Other operating instruments are inserted through the other incisions. Laparoscopic procedures may not be appropriate when:  There is major scarring from previous surgery.  The gallbladder is extremely inflamed.  There are bleeding disorders or unexpected cirrhosis of the liver.  A pregnancy is near term.  Other conditions make the laparoscopic procedure impossible. If your surgeon feels it is not safe to continue with a laparoscopic procedure, he or she will perform an open abdominal procedure. In this case, the surgeon will make an incision to open the abdomen. This gives the surgeon a larger view and field to work within. This may allow the surgeon to perform procedures that sometimes cannot be performed with a laparoscope alone. Open surgery has a longer recovery time. AFTER THE PROCEDURE  You will be taken to the recovery area where a nurse will watch and check your progress.  You may be allowed to go home the same day.  Do not resume physical activities until directed by your caregiver.  You may resume a normal diet and activities as directed. Document Released: 04/01/2005 Document Revised: 06/24/2011 Document Reviewed: 09/14/2010 [facility] Patient Information 2013 [place], [place]. PATIENT INSTRUCTIONS POST-ANESTHESIA  IMMEDIATELY FOLLOWING SURGERY:  Do not drive or operate machinery for the first twenty four hours after surgery.  Do not make any important decisions for twenty four hours after surgery or while taking narcotic pain medications or sedatives.  If you develop intractable nausea and vomiting or a severe headache please notify your doctor immediately.  FOLLOW-UP:  Please make an appointment with your surgeon as instructed. You do not need to follow up with anesthesia unless specifically instructed to do so.  WOUND CARE INSTRUCTIONS (if applicable):  Keep a dry clean dressing on  the anesthesia/puncture wound site if there is drainage.  Once the wound has quit draining you may leave it open to air.  Generally you should leave the bandage intact for twenty four hours unless there is drainage.  If the epidural site drains for more than 36-48 hours please call the anesthesia department.  QUESTIONS?:  Please feel free to call your physician or the hospital operator if you have any questions, and they will be happy to assist you.

## 2012-08-20 NOTE — H&P (Signed)
  NTS SOAP Note  Vital Signs:  Vitals as of: 08/20/2012: Systolic 137: Diastolic 91: Heart Rate 90: Temp 97.51F: Height 17ft 3in: Weight 225Lbs 0 Ounces: Pain Level 3: BMI 39.86  BMI : 39.86 kg/m2  Subjective: This 32 Years 2 Months old Female presents for of    ABDOMINAL PAIN : ,Has been having right upper quadrant abdominal pain, nausea, and bloating for several weeks now.  U/S of gallbladder reveals cholelithiasis with normal common bile duct.  No fever, chills, jaundice.  Review of Symptoms:  Constitutional:unremarkable   Head:unremarkable    Eyes:unremarkable   Nose/Mouth/Throat:unremarkable Cardiovascular:  unremarkable   Respiratory:unremarkable   Gastrointestin    abdominal pain,nausea,heartburn,dyspepsia Genitourinary:unremarkable       back pain Skin:unremarkable Hematolgic/Lymphatic:unremarkable       seasonal allergies   Past Medical History:    Reviewed   Past Medical History  Surgical History:  T and A, microdiscectomy Medical Problems: exxtrinsic allergies Allergies: chocolate, shellfish, zyrtec Medications: protonix, claritin, tramadol, ibuprofen   Social History:Reviewed  Social History  Preferred Language: English Race:  White Ethnicity: Not Hispanic / Latino Age: 32 Years 2 Months Marital Status:  M Alcohol:  No Recreational drug(s):  No   Smoking Status: Never smoker reviewed on 08/20/2012 Functional Status reviewed on mm/dd/yyyy ------------------------------------------------ Bathing: Normal Cooking: Normal Dressing: Normal Driving: Normal Eating: Normal Managing Meds: Normal Oral Care: Normal Shopping: Normal Toileting: Normal Transferring: Normal Walking: Normal Cognitive Status reviewed on mm/dd/yyyy ------------------------------------------------ Attention: Normal Decision Making: Normal Language: Normal Memory: Normal Motor: Normal Perception: Normal Problem Solving:  Normal Visual and Spatial: Normal   Family History:  Reviewed   Family History  Is there a family history [id number]    Objective Information: General:  Well appearing, well nourished in no distress.   no scleral icterus Heart:  RRR, no murmur Lungs:    CTA bilaterally, no wheezes, rhonchi, rales.  Breathing unlabored. Abdomen:Soft, tender in right upper quadrant to deep palpation, ND, no HSM, no masses.  Assessment:Biliary colic, cholelithiasis  Diagnosis &amp; Procedure Smart Code   Plan:Scheduled for laparoscopic cholecystectomy on 08/24/12.   Patient Education:Alternative treatments to surgery were discussed with patient (and family).  Risks and benefits  of procedure including bleeding, infection, hepatobiliary injury, and the possibility of an open procedure were fully explained to the patient (and family) who gave informed consent. Patient/family questions were addressed.  Follow-up:Pending Surgery

## 2012-08-21 ENCOUNTER — Encounter (HOSPITAL_COMMUNITY): Payer: Self-pay

## 2012-08-21 ENCOUNTER — Encounter (HOSPITAL_COMMUNITY)
Admission: RE | Admit: 2012-08-21 | Discharge: 2012-08-21 | Disposition: A | Discharge status: Home / Self Care | Payer: 59 | Source: Ambulatory Visit | Attending: General Surgery | Admitting: General Surgery

## 2012-08-21 LAB — PREGNANCY, URINE: Preg Test, Ur: NEGATIVE

## 2012-08-21 NOTE — Progress Notes (Signed)
Pt refused surgical PCR sreening swab. Cliffton Asters RN, BSN, CNOR, [employer]

## 2012-08-24 ENCOUNTER — Ambulatory Visit (HOSPITAL_COMMUNITY): Payer: 59 | Admitting: Anesthesiology

## 2012-08-24 ENCOUNTER — Ambulatory Visit (HOSPITAL_COMMUNITY)
Admission: RE | Admit: 2012-08-24 | Discharge: 2012-08-24 | Disposition: A | Discharge status: Home / Self Care | Payer: 59 | Source: Ambulatory Visit | Attending: General Surgery | Admitting: General Surgery

## 2012-08-24 ENCOUNTER — Encounter (HOSPITAL_COMMUNITY): Payer: Self-pay | Admitting: Anesthesiology

## 2012-08-24 ENCOUNTER — Encounter (HOSPITAL_COMMUNITY): Payer: Self-pay | Admitting: *Deleted

## 2012-08-24 ENCOUNTER — Encounter (HOSPITAL_COMMUNITY)
Admission: RE | Disposition: A | Discharge status: Home / Self Care | Payer: Self-pay | Source: Ambulatory Visit | Attending: General Surgery

## 2012-08-24 DIAGNOSIS — M5090 Cervical disc disorder, unspecified, unspecified cervical region: Secondary | ICD-10-CM

## 2012-08-24 DIAGNOSIS — K8010 Calculus of gallbladder with chronic cholecystitis without obstruction: Secondary | ICD-10-CM | POA: Insufficient documentation

## 2012-08-24 HISTORY — PX: CHOLECYSTECTOMY: SHX55

## 2012-08-24 SURGERY — LAPAROSCOPIC CHOLECYSTECTOMY
Anesthesia: General | Site: Abdomen | Wound class: Contaminated

## 2012-08-24 MED ORDER — ONDANSETRON HCL 4 MG/2ML IJ SOLN
4.0000 mg | Freq: Once | INTRAMUSCULAR | Status: AC
Start: 2012-08-24 — End: 2012-08-24
  Administered 2012-08-24: 4 mg via INTRAVENOUS

## 2012-08-24 MED ORDER — PROPOFOL 10 MG/ML IV BOLUS
INTRAVENOUS | Status: DC | PRN
  Administered 2012-08-24: 150 mg via INTRAVENOUS
  Administered 2012-08-24: 20 mg via INTRAVENOUS

## 2012-08-24 MED ORDER — CEFAZOLIN SODIUM-DEXTROSE 2-3 GM-% IV SOLR
2.0000 g | INTRAVENOUS | Status: AC
  Administered 2012-08-24: 2 g via INTRAVENOUS

## 2012-08-24 MED ORDER — GLYCOPYRROLATE 0.2 MG/ML IJ SOLN
INTRAMUSCULAR | Status: AC
  Filled 2012-08-24: qty 1

## 2012-08-24 MED ORDER — MIDAZOLAM HCL 2 MG/2ML IJ SOLN
INTRAMUSCULAR | Status: AC
  Filled 2012-08-24: qty 2

## 2012-08-24 MED ORDER — ONDANSETRON HCL 4 MG/2ML IJ SOLN: 4.0000 mg | Freq: Once | INTRAMUSCULAR | Status: DC | PRN

## 2012-08-24 MED ORDER — MIDAZOLAM HCL 2 MG/2ML IJ SOLN
1.0000 mg | INTRAMUSCULAR | Status: DC | PRN
  Administered 2012-08-24 (×2): 2 mg via INTRAVENOUS

## 2012-08-24 MED ORDER — TRAMADOL HCL 50 MG PO TABS: 50.0000 mg | ORAL_TABLET | Freq: Four times a day (QID) | ORAL | Status: AC | PRN

## 2012-08-24 MED ORDER — CHLORHEXIDINE GLUCONATE 4 % EX LIQD: 1.0000 "application " | Freq: Once | CUTANEOUS | Status: DC

## 2012-08-24 MED ORDER — FENTANYL CITRATE 0.05 MG/ML IJ SOLN
INTRAMUSCULAR | Status: AC
  Filled 2012-08-24: qty 5

## 2012-08-24 MED ORDER — GLYCOPYRROLATE 0.2 MG/ML IJ SOLN
INTRAMUSCULAR | Status: AC
  Filled 2012-08-24: qty 3

## 2012-08-24 MED ORDER — FENTANYL CITRATE 0.05 MG/ML IJ SOLN
25.0000 ug | INTRAMUSCULAR | Status: DC | PRN
  Administered 2012-08-24 (×2): 50 ug via INTRAVENOUS

## 2012-08-24 MED ORDER — PROPOFOL 10 MG/ML IV EMUL
INTRAVENOUS | Status: AC
  Filled 2012-08-24: qty 20

## 2012-08-24 MED ORDER — ROCURONIUM BROMIDE 50 MG/5ML IV SOLN
INTRAVENOUS | Status: AC
  Filled 2012-08-24: qty 1

## 2012-08-24 MED ORDER — LIDOCAINE HCL (CARDIAC) 10 MG/ML IV SOLN
INTRAVENOUS | Status: DC | PRN
  Administered 2012-08-24: 20 mg via INTRAVENOUS

## 2012-08-24 MED ORDER — GLYCOPYRROLATE 0.2 MG/ML IJ SOLN
0.2000 mg | Freq: Once | INTRAMUSCULAR | Status: AC
  Administered 2012-08-24: 0.2 mg via INTRAVENOUS

## 2012-08-24 MED ORDER — BUPIVACAINE HCL (PF) 0.5 % IJ SOLN
INTRAMUSCULAR | Status: AC
  Filled 2012-08-24: qty 30

## 2012-08-24 MED ORDER — FENTANYL CITRATE 0.05 MG/ML IJ SOLN
INTRAMUSCULAR | Status: DC | PRN
  Administered 2012-08-24 (×5): 50 ug via INTRAVENOUS

## 2012-08-24 MED ORDER — BUPIVACAINE HCL (PF) 0.5 % IJ SOLN
INTRAMUSCULAR | Status: DC | PRN
  Administered 2012-08-24: 10 mL

## 2012-08-24 MED ORDER — ROCURONIUM BROMIDE 100 MG/10ML IV SOLN
INTRAVENOUS | Status: DC | PRN
  Administered 2012-08-24: 30 mg via INTRAVENOUS
  Administered 2012-08-24: 5 mg via INTRAVENOUS

## 2012-08-24 MED ORDER — SODIUM CHLORIDE 0.9 % IV SOLN
INTRAVENOUS | Status: DC | PRN
  Administered 2012-08-24: 11:00:00 via INTRAVENOUS

## 2012-08-24 MED ORDER — KETOROLAC TROMETHAMINE 30 MG/ML IJ SOLN
INTRAMUSCULAR | Status: AC
  Filled 2012-08-24: qty 1

## 2012-08-24 MED ORDER — GLYCOPYRROLATE 0.2 MG/ML IJ SOLN
INTRAMUSCULAR | Status: DC | PRN
  Administered 2012-08-24: 0.6 mg via INTRAVENOUS

## 2012-08-24 MED ORDER — SODIUM CHLORIDE 0.9 % IR SOLN
Status: DC | PRN
  Administered 2012-08-24: 1000 mL

## 2012-08-24 MED ORDER — DEXAMETHASONE SODIUM PHOSPHATE 4 MG/ML IJ SOLN
INTRAMUSCULAR | Status: AC
  Filled 2012-08-24: qty 1

## 2012-08-24 MED ORDER — NEOSTIGMINE METHYLSULFATE 1 MG/ML IJ SOLN
INTRAMUSCULAR | Status: DC | PRN
  Administered 2012-08-24: 3 mg via INTRAVENOUS

## 2012-08-24 MED ORDER — ONDANSETRON HCL 4 MG/2ML IJ SOLN
INTRAMUSCULAR | Status: AC
  Filled 2012-08-24: qty 2

## 2012-08-24 MED ORDER — CEFAZOLIN SODIUM-DEXTROSE 2-3 GM-% IV SOLR
INTRAVENOUS | Status: AC
  Filled 2012-08-24: qty 50

## 2012-08-24 MED ORDER — ENOXAPARIN SODIUM 40 MG/0.4ML ~~LOC~~ SOLN
SUBCUTANEOUS | Status: AC
  Filled 2012-08-24: qty 0.4

## 2012-08-24 MED ORDER — HEMOSTATIC AGENTS (NO CHARGE) OPTIME
TOPICAL | Status: DC | PRN
  Administered 2012-08-24: 1 via TOPICAL

## 2012-08-24 MED ORDER — KETOROLAC TROMETHAMINE 30 MG/ML IJ SOLN
30.0000 mg | Freq: Once | INTRAMUSCULAR | Status: AC
  Administered 2012-08-24: 30 mg via INTRAVENOUS

## 2012-08-24 MED ORDER — DEXAMETHASONE SODIUM PHOSPHATE 4 MG/ML IJ SOLN
4.0000 mg | Freq: Once | INTRAMUSCULAR | Status: AC
  Administered 2012-08-24: 4 mg via INTRAVENOUS

## 2012-08-24 MED ORDER — FENTANYL CITRATE 0.05 MG/ML IJ SOLN
INTRAMUSCULAR | Status: AC
  Filled 2012-08-24: qty 2

## 2012-08-24 MED ORDER — LACTATED RINGERS IV SOLN
INTRAVENOUS | Status: DC
  Administered 2012-08-24: 09:00:00 via INTRAVENOUS

## 2012-08-24 MED ORDER — ENOXAPARIN SODIUM 40 MG/0.4ML ~~LOC~~ SOLN
40.0000 mg | Freq: Once | SUBCUTANEOUS | Status: AC
  Administered 2012-08-24: 40 mg via SUBCUTANEOUS

## 2012-08-24 SURGICAL SUPPLY — 32 items
APPLIER CLIP LAPSCP 10X32 DD (CLIP) ×2 IMPLANT
BAG HAMPER (MISCELLANEOUS) ×2 IMPLANT
CHLORAPREP W/TINT 26ML (MISCELLANEOUS) ×2 IMPLANT
CLOTH BEACON ORANGE TIMEOUT ST (SAFETY) ×2 IMPLANT
COVER LIGHT HANDLE STERIS (MISCELLANEOUS) ×4 IMPLANT
DECANTER SPIKE VIAL GLASS SM (MISCELLANEOUS) ×2 IMPLANT
DURAPREP 26ML APPLICATOR (WOUND CARE) IMPLANT
ELECT REM PT RETURN 9FT ADLT (ELECTROSURGICAL) ×2
ELECTRODE REM PT RTRN 9FT ADLT (ELECTROSURGICAL) ×1 IMPLANT
FILTER SMOKE EVAC LAPAROSHD (FILTER) ×2 IMPLANT
FORMALIN 10 PREFIL 120ML (MISCELLANEOUS) ×2 IMPLANT
GLOVE BIO SURGEON STRL SZ7.5 (GLOVE) ×2 IMPLANT
GLOVE BIOGEL PI IND STRL 7.0 (GLOVE) ×2 IMPLANT
GLOVE BIOGEL PI INDICATOR 7.0 (GLOVE) ×2
GLOVE OPTIFIT SS 6.5 STRL BRWN (GLOVE) ×4 IMPLANT
GOWN STRL REIN XL XLG (GOWN DISPOSABLE) ×6 IMPLANT
HEMOSTAT SNOW SURGICEL 2X4 (HEMOSTASIS) ×2 IMPLANT
INST SET LAPROSCOPIC AP (KITS) ×2 IMPLANT
KIT ROOM TURNOVER APOR (KITS) ×2 IMPLANT
KIT TROCAR LAP CHOLE (TROCAR) ×2 IMPLANT
MANIFOLD NEPTUNE II (INSTRUMENTS) ×2 IMPLANT
NS IRRIG 1000ML POUR BTL (IV SOLUTION) ×2 IMPLANT
PACK LAP CHOLE LZT030E (CUSTOM PROCEDURE TRAY) ×2 IMPLANT
PAD ARMBOARD 7.5X6 YLW CONV (MISCELLANEOUS) ×2 IMPLANT
POUCH SPECIMEN RETRIEVAL 10MM (ENDOMECHANICALS) ×2 IMPLANT
SET BASIN LINEN APH (SET/KITS/TRAYS/PACK) ×2 IMPLANT
SPONGE GAUZE 2X2 8PLY STRL LF (GAUZE/BANDAGES/DRESSINGS) ×2 IMPLANT
STAPLER VISISTAT (STAPLE) ×2 IMPLANT
SUT VICRYL 0 UR6 27IN ABS (SUTURE) ×2 IMPLANT
TAPE CLOTH SURG 4X10 WHT LF (GAUZE/BANDAGES/DRESSINGS) ×2 IMPLANT
WARMER LAPAROSCOPE (MISCELLANEOUS) ×2 IMPLANT
YANKAUER SUCT 12FT TUBE ARGYLE (SUCTIONS) ×2 IMPLANT

## 2012-08-24 NOTE — Progress Notes (Signed)
111/80,  96 sat , 74 hr,  17  resp.

## 2012-08-24 NOTE — Transfer of Care (Signed)
Immediate Anesthesia Transfer of Care Note  Patient: [name]  Procedure(s) Performed: Procedure(s) (LRB): LAPAROSCOPIC CHOLECYSTECTOMY (N/A)  Patient Location: PACU  Anesthesia Type: General  Level of Consciousness: awake  Airway & Oxygen Therapy: Patient Spontanous Breathing and non-rebreather face mask  Post-op Assessment: Report given to PACU RN, Post -op Vital signs reviewed and stable and Patient moving all extremities  Post vital signs: Reviewed and stable  Complications: No apparent anesthesia complications

## 2012-08-24 NOTE — Anesthesia Preprocedure Evaluation (Addendum)
Anesthesia Evaluation  Patient identified by MRN, date of birth, ID band Patient awake    Reviewed: Allergy & Precautions, H&P , NPO status , Patient's Chart, lab work & pertinent test results  History of Anesthesia Complications Negative for: history of anesthetic complications  Airway Mallampati: II TM Distance: >3 FB Neck ROM: Full    Dental  (+) Teeth Intact   Pulmonary neg pulmonary ROS,  breath sounds clear to auscultation        Cardiovascular negative cardio ROS  Rhythm:Regular Rate:Normal     Neuro/Psych  Headaches,    GI/Hepatic GERD-  Medicated and Controlled,  Endo/Other    Renal/GU      Musculoskeletal   Abdominal   Peds  Hematology   Anesthesia Other Findings   Reproductive/Obstetrics                          Anesthesia Physical Anesthesia Plan  ASA: II  Anesthesia Plan: General   Post-op Pain Management:    Induction: Intravenous, Rapid sequence and Cricoid pressure planned  Airway Management Planned: Oral ETT  Additional Equipment:   Intra-op Plan:   Post-operative Plan: Extubation in OR  Informed Consent: I have reviewed the patients History and Physical, chart, labs and discussed the procedure including the risks, benefits and alternatives for the proposed anesthesia with the patient or authorized representative who has indicated his/her understanding and acceptance.     Plan Discussed with:   Anesthesia Plan Comments:         Anesthesia Quick Evaluation

## 2012-08-24 NOTE — Op Note (Signed)
Patient:  [name]  DOB:  1980/05/10  MRN:  [id number]   Preop Diagnosis:  Biliary colic, cholelithiasis  Postop Diagnosis:  Same  Procedure:  Laparoscopic cholecystectomy  Surgeon:  Franky Macho, M.D.  Anes:  General endotracheal  Indications:  Patient is a 32 year old white female presents with biliary colic secondary to cholelithiasis. The risks and benefits of the procedure including bleeding, infection, hepatobiliary, and the possibility of an open procedure were fully explained to the patient, who gave informed consent.  Procedure note:  The patient was placed in the supine position. After induction of general endotracheal anesthesia, the abdomen was prepped and draped using usual sterile technique with DuraPrep. Surgical site confirmation was performed.  An infraumbilical incision was made down to the fascia. A Veress needle was introduced into the abdominal cavity and confirmation of placement was done using the saline drop test. The abdomen was then insufflated to 16 mm mercury pressure. An 11 mm trocar was introduced into the abdominal cavity under direct visualization without difficulty. The patient was placed in reverse Trendelenburg position and additional 11 mm trocar was placed the epigastric region and 5 mm trochars were placed the right upper quadrant and right flank regions. The liver was inspected and noted within normal limits. The gallbladder was retracted in a dynamic fashion in order to expose the triangle of Calot. The cystic duct was first identified. Its juncture to the infundibulum was fully identified. Endoclips were placed proximally and distally on the cystic duct and the cystic duct was divided. This is likewise done to the cystic artery. The gallbladder was then freed away from the gallbladder fossa using Bovie electrocautery. The gallbladder was delivered through the epigastric trocar site using an Endo Catch bag. The gallbladder fossa was inspected and no  abnormal bleeding or bile leakage was noted. Surgicel is placed the gallbladder fossa. All fluid and air were then evacuated from the abdominal cavity prior to removal of the trochars.  All wounds were irrigated with normal saline. All wounds were injected with 0.5% Sensorcaine. The infraumbilical fascia as well as epigastric fascia were reapproximated using 0 Vicryl interrupted sutures. All skin incisions were closed using staples. Betadine ointment and dry sterile dressings were applied.  All tape and needle counts were correct at the end of the procedure. The patient was extubated in the operating room and transferred to PACU in stable condition.  Complications:  None  EBL:  Minimal  Specimen:  Gallbladder

## 2012-08-24 NOTE — Anesthesia Procedure Notes (Signed)
Procedure Name: Intubation Date/Time: 08/24/2012 10:49 AM Performed by: Franco Nones Pre-anesthesia Checklist: Patient identified, Patient being monitored, Timeout performed, Emergency Drugs available and Suction available Patient Re-evaluated:Patient Re-evaluated prior to inductionOxygen Delivery Method: Circle System Utilized Preoxygenation: Pre-oxygenation with 100% oxygen Intubation Type: IV induction, Rapid sequence and Cricoid Pressure applied Ventilation: Mask ventilation without difficulty Laryngoscope Size: Miller and 2 Grade View: Grade I Tube type: Oral Tube size: 7.0 mm Number of attempts: 1 Airway Equipment and Method: stylet Placement Confirmation: ETT inserted through vocal cords under direct vision,  positive ETCO2 and breath sounds checked- equal and bilateral Secured at: 21 cm Tube secured with: Tape Dental Injury: Teeth and Oropharynx as per pre-operative assessment

## 2012-08-24 NOTE — Interval H&P Note (Signed)
History and Physical Interval Note:  08/24/2012 10:06 AM  [name]  has presented today for surgery, with the diagnosis of gallstones  The various methods of treatment have been discussed with the patient and family. After consideration of risks, benefits and other options for treatment, the patient has consented to  Procedure(s): LAPAROSCOPIC CHOLECYSTECTOMY (N/A) as a surgical intervention .  The patient's history has been reviewed, patient examined, no change in status, stable for surgery.  I have reviewed the patient's chart and labs.  Questions were answered to the patient's satisfaction.     Franky Macho A

## 2012-08-24 NOTE — Anesthesia Postprocedure Evaluation (Signed)
  Anesthesia Post-op Note  Patient: [name]  Procedure(s) Performed: Procedure(s): LAPAROSCOPIC CHOLECYSTECTOMY (N/A)  Patient Location: PACU  Anesthesia Type:General  Level of Consciousness: awake, oriented and patient cooperative  Airway and Oxygen Therapy: Patient Spontanous Breathing and non-rebreather face mask  Post-op Pain: moderate  Post-op Assessment: Post-op Vital signs reviewed, Patient's Cardiovascular Status Stable, Respiratory Function Stable and Patent Airway  Post-op Vital Signs: Reviewed and stable  Complications: No apparent anesthesia complications

## 2012-08-26 ENCOUNTER — Encounter (HOSPITAL_COMMUNITY): Payer: Self-pay | Admitting: General Surgery

## 2012-09-10 ENCOUNTER — Encounter: Payer: Self-pay | Admitting: Cardiology

## 2012-09-10 ENCOUNTER — Ambulatory Visit (INDEPENDENT_AMBULATORY_CARE_PROVIDER_SITE_OTHER): Payer: 59 | Admitting: Cardiology

## 2012-09-10 VITALS — BP 114/80 | HR 96 | Ht 63.0 in | Wt 224.8 lb

## 2012-09-10 DIAGNOSIS — R002 Palpitations: Secondary | ICD-10-CM

## 2012-09-10 MED ORDER — METOPROLOL TARTRATE 50 MG PO TABS: ORAL_TABLET | ORAL | Status: DC

## 2012-09-10 NOTE — Patient Instructions (Addendum)
Your physician recommends that you schedule a follow-up appointment in: AS NEEDED  Your physician has recommended you make the following change in your medication:   1) START METOPROLOL 50MG  (YOU MAY TAKE ONCE TO TWO TIMES DAILY AS NEEDED)

## 2012-09-10 NOTE — Progress Notes (Signed)
Patient ID: [name], female   DOB: 11-16-1980, 32 y.o.   MRN: [id number]  HPI: Schedule return visit for this pleasant young woman with palpitations. Holter monitoring revealed frequent PVCs with an uncertain relationship between the frequency of ectopic beats and symptoms.  She recently underwent cholecystectomy on a semielective basis without apparent perioperative complications.  Since that procedure, she has been less aware of intermittent palpitations.  Current Outpatient Prescriptions  Medication Sig Dispense Refill  . Cholecalciferol (VITAMIN D) 2000 UNITS CAPS Take 1 capsule by mouth daily.      Marland Kitchen EPINEPHrine (EPI-PEN) 0.3 mg/0.3 mL DEVI Use as directed  1 Device  1  . loratadine (CLARITIN) 10 MG tablet Take 10 mg by mouth daily.        . pantoprazole (PROTONIX) 40 MG tablet Take 1 tablet (40 mg total) by mouth daily.  90 tablet  1  . traMADol (ULTRAM) 50 MG tablet Take 1 tablet (50 mg total) by mouth every 6 (six) hours as needed for pain.  30 tablet  1  . metoprolol (LOPRESSOR) 50 MG tablet TAKE ONE TO TWO TABLETS DAILY AS NEEDED  60 tablet  0   No current facility-administered medications for this visit.   Allergies  Allergen Reactions  . Shellfish Allergy Anaphylaxis  . Cetirizine Hcl   . Chocolate Hives     Past medical history, social history, and family history reviewed and updated.  PHYSICAL EXAM: BP 114/80  Pulse 96  Ht 5\' 3"  (1.6 m)  Wt 101.946 kg (224 lb 12 oz)  BMI 39.82 kg/m2  LMP 08/23/2012;  Body mass index is 39.82 kg/(m^2). General-Well developed; no acute distress Body habitus-obese Neck-No JVD; no carotid bruits Lungs-clear lung fields; resonant to percussion Cardiovascular-normal PMI; normal S1 and S2 Abdomen-normal bowel sounds; soft and non-tender without masses or organomegaly Musculoskeletal-No deformities, no cyanosis or clubbing Neurologic-Normal cranial nerves; symmetric strength and tone Skin-Warm, no significant  lesions Extremities-distal pulses intact; no edema  [place], MD 09/10/2012  2:28 PM  ASSESSMENT AND PLAN

## 2012-09-10 NOTE — Assessment & Plan Note (Signed)
Palpitations with benign arrhythmia. I explained that no significant health consequences are expected. Patient offered choice of daily treatment, intermittent treatment or no treatment. She chooses to use as needed metoprolol at a dose of 50 milligrams once or twice per day.

## 2012-09-10 NOTE — Progress Notes (Deleted)
Name: [name]    DOB: [birth year]  Age: 32 y.o.  MR#: [id number]       PCP:  Syliva Overman, MD      Insurance: Payor: [employer] / Plan: [place] [employer] / Product Type: *No Product type* /   CC:   No chief complaint on file.  NO LIST VS Filed Vitals:   09/10/12 1318  BP: 114/80  Pulse: 96  Height: 5\' 3"  (1.6 m)  Weight: 224 lb 12 oz (101.946 kg)    Weights Current Weight  09/10/12 224 lb 12 oz (101.946 kg)  08/24/12 220 lb (99.791 kg)  08/24/12 220 lb (99.791 kg)    Blood Pressure  BP Readings from Last 3 Encounters:  09/10/12 114/80  08/24/12 130/83  08/24/12 130/83     Admit date:  (Not on file) Last encounter with [facility]:  08/20/2012   Allergy Shellfish allergy; Cetirizine hcl; and Chocolate  Current Outpatient Prescriptions  Medication Sig Dispense Refill  . Cholecalciferol (VITAMIN D) 2000 UNITS CAPS Take 1 capsule by mouth daily.      Marland Kitchen EPINEPHrine (EPI-PEN) 0.3 mg/0.3 mL DEVI Use as directed  1 Device  1  . loratadine (CLARITIN) 10 MG tablet Take 10 mg by mouth daily.        . pantoprazole (PROTONIX) 40 MG tablet Take 1 tablet (40 mg total) by mouth daily.  90 tablet  1  . traMADol (ULTRAM) 50 MG tablet Take 1 tablet (50 mg total) by mouth every 6 (six) hours as needed for pain.  30 tablet  1   No current facility-administered medications for this visit.    Discontinued Meds:   There are no discontinued medications.  Patient Active Problem List   Diagnosis Date Noted  . Dyspepsia 07/25/2012  . Hx of diagnostic tests 07/23/2012  . Hyperlipidemia 07/23/2012  . Palpitations 07/22/2012  . GERD (gastroesophageal reflux disease) 07/22/2012  . Radicular pain of left lower extremity 11/26/2011  . Insomnia 09/05/2010  . ALLERGIC RHINITIS, SEASONAL 03/12/2010  . Obesity (BMI 30-39.9) 10/09/2009    LABS    Component Value Date/Time   NA 139 07/22/2012 0840   NA 141 02/19/2011 0850   NA 134* 07/17/2009 1452   K 4.5 07/22/2012 0840   K 4.5 02/19/2011  0850   K 3.8 07/17/2009 1452   CL 103 07/22/2012 0840   CL 105 02/19/2011 0850   CL 105 07/17/2009 1452   CO2 24 07/22/2012 0840   CO2 27 02/19/2011 0850   CO2 21 07/17/2009 1452   GLUCOSE 87 07/22/2012 0840   GLUCOSE 79 02/19/2011 0850   GLUCOSE 123* 07/17/2009 1452   BUN 12 07/22/2012 0840   BUN 19 02/19/2011 0850   BUN 13 07/17/2009 1452   CREATININE 0.67 07/22/2012 0840   CREATININE 0.66 02/19/2011 0850   CREATININE 0.74 07/17/2009 1452   CALCIUM 10.1 07/22/2012 0840   CALCIUM 9.3 02/19/2011 0850   CALCIUM 9.3 07/17/2009 1452   GFRNONAA >60 07/17/2009 1452   GFRAA  Value: >60        The eGFR has been calculated using the MDRD equation. This calculation has not been validated in all clinical situations. eGFR's persistently <60 mL/min signify possible Chronic Kidney Disease. 07/17/2009 1452   CMP     Component Value Date/Time   NA 139 07/22/2012 0840   K 4.5 07/22/2012 0840   CL 103 07/22/2012 0840   CO2 24 07/22/2012 0840   GLUCOSE 87 07/22/2012 0840  BUN 12 07/22/2012 0840   CREATININE 0.67 07/22/2012 0840   CREATININE 0.74 07/17/2009 1452   CALCIUM 10.1 07/22/2012 0840   PROT 7.4 07/22/2012 0840   ALBUMIN 4.4 07/22/2012 0840   AST 18 07/22/2012 0840   ALT 26 07/22/2012 0840   ALKPHOS 90 07/22/2012 0840   BILITOT 0.2* 07/22/2012 0840   GFRNONAA >60 07/17/2009 1452   GFRAA  Value: >60        The eGFR has been calculated using the MDRD equation. This calculation has not been validated in all clinical situations. eGFR's persistently <60 mL/min signify possible Chronic Kidney Disease. 07/17/2009 1452       Component Value Date/Time   WBC 10.7* 07/22/2012 0840   WBC 13.6* 12/02/2011 1437   WBC 10.4 02/19/2011 0850   HGB 14.5 07/22/2012 0840   HGB 14.3 12/02/2011 1437   HGB 12.9 02/19/2011 0850   HCT 43.1 07/22/2012 0840   HCT 42.6 12/02/2011 1437   HCT 42.8 02/19/2011 0850   MCV 84.2 07/22/2012 0840   MCV 86.9 12/02/2011 1437   MCV 91.8 02/19/2011 0850    Lipid Panel     Component Value Date/Time   CHOL 229* 07/22/2012 0840   TRIG 93  07/22/2012 0840   HDL 60 07/22/2012 0840   CHOLHDL 3.8 07/22/2012 0840   VLDL 19 07/22/2012 0840   LDLCALC 150* 07/22/2012 0840    ABG No results found for this basename: phart, pco2, pco2art, po2, po2art, hco3, tco2, acidbasedef, o2sat     Lab Results  Component Value Date   TSH 2.971 07/22/2012   BNP (last 3 results) No results found for this basename: PROBNP,  in the last 8760 hours Cardiac Panel (last 3 results) No results found for this basename: CKTOTAL, CKMB, TROPONINI, RELINDX,  in the last 72 hours  Iron/TIBC/Ferritin No results found for this basename: iron, tibc, ferritin     EKG Orders placed during the hospital encounter of 07/27/12  . HOLTER MONITOR - 48 HOUR  . HOLTER MONITOR - 48 HOUR  . HOLTER MONITOR - 48 HOUR  . HOLTER MONITOR - 48 HOUR     Prior Assessment and Plan Problem List as of 09/10/2012   Obesity (BMI 30-39.9)   Last Assessment & Plan   07/22/2012 Office Visit Written 07/25/2012  9:14 PM by Kerri Perches, MD     Deteriorated. Patient re-educated about  the importance of commitment to a  minimum of 150 minutes of exercise per week. The importance of healthy food choices with portion control discussed. Encouraged to start a food diary, count calories and to consider  joining a support group. Sample diet sheets offered. Goals set by the patient for the next several months.       ALLERGIC RHINITIS, SEASONAL   Last Assessment & Plan   07/22/2012 Office Visit Written 07/25/2012  9:14 PM by Kerri Perches, MD     Mild symptoms uses OTC med as needed    Insomnia   Last Assessment & Plan   12/05/2010 Office Visit Written 12/17/2010  9:25 PM by Kerri Perches, MD     Improved, and not using prescription med, less stressed, and more active    Radicular pain of left lower extremity   Last Assessment & Plan   03/23/2012 Office Visit Written 03/23/2012  1:17 PM by Kerri Perches, MD     Improved since surgery    Palpitations   Last Assessment & Plan    07/22/2012 Office Visit  Written 07/25/2012  9:15 PM by Kerri Perches, MD     Reports increased palpitations inpast month, but gives a "chronic history" will refer for cardiology eval. Regular heart rate at this visit    GERD (gastroesophageal reflux disease)   Last Assessment & Plan   07/22/2012 Office Visit Written 07/25/2012  9:17 PM by Kerri Perches, MD     Pt to start meds and she is referred for gall bladder eval based on current complaints    Hx of diagnostic tests   Hyperlipidemia   Last Assessment & Plan   07/22/2012 Office Visit Written 07/25/2012  9:16 PM by Kerri Perches, MD     Deteriorated and uncontrolled, pt needs to focus on dietary change to improve this, no meds at this time    Dyspepsia   Last Assessment & Plan   07/22/2012 Office Visit Written 07/25/2012  9:21 PM by Kerri Perches, MD     Recurrent epigastric discomfort with bloating and belching eval gallbladder fully, if neg , then GI eval. Pt to take PPI for 2 month based on symptoms        Imaging: No results found.

## 2012-11-25 ENCOUNTER — Ambulatory Visit (INDEPENDENT_AMBULATORY_CARE_PROVIDER_SITE_OTHER): Payer: 59 | Admitting: Family Medicine

## 2012-11-25 ENCOUNTER — Encounter: Payer: Self-pay | Admitting: Family Medicine

## 2012-11-25 VITALS — BP 110/78 | HR 83 | Resp 16 | Ht 63.0 in | Wt 230.4 lb

## 2012-11-25 DIAGNOSIS — K219 Gastro-esophageal reflux disease without esophagitis: Secondary | ICD-10-CM

## 2012-11-25 DIAGNOSIS — R5381 Other malaise: Secondary | ICD-10-CM

## 2012-11-25 DIAGNOSIS — E785 Hyperlipidemia, unspecified: Secondary | ICD-10-CM

## 2012-11-25 DIAGNOSIS — R1013 Epigastric pain: Secondary | ICD-10-CM

## 2012-11-25 DIAGNOSIS — K3189 Other diseases of stomach and duodenum: Secondary | ICD-10-CM

## 2012-11-25 DIAGNOSIS — J0100 Acute maxillary sinusitis, unspecified: Secondary | ICD-10-CM

## 2012-11-25 DIAGNOSIS — J301 Allergic rhinitis due to pollen: Secondary | ICD-10-CM

## 2012-11-25 DIAGNOSIS — E669 Obesity, unspecified: Secondary | ICD-10-CM

## 2012-11-25 DIAGNOSIS — J029 Acute pharyngitis, unspecified: Secondary | ICD-10-CM

## 2012-11-25 LAB — POCT RAPID STREP A (OFFICE): Rapid Strep A Screen: NEGATIVE

## 2012-11-25 MED ORDER — AZITHROMYCIN 250 MG PO TABS: ORAL_TABLET | ORAL | Status: AC

## 2012-11-25 MED ORDER — FLUCONAZOLE 150 MG PO TABS: ORAL_TABLET | ORAL | Status: AC

## 2012-11-25 NOTE — Progress Notes (Signed)
  Subjective:    Patient ID: [name], female    DOB: Mar 25, 1981, 32 y.o.   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 Carrying the burden of comforting a mom who has been grieving recent loss ans well as deterioration in her own health. Also raising her children pressed to make the time to focus on h er own health needs with continued weight gain and little to no exercise. Wants to change this. Improved dyspepsia s/p cholecystectomy, placed on beta blocker by cardiology for palpitations and reports symptom improvement   Review of Systems See HPI . Denies chest congestion, productive cough or wheezing. Denies chest pains, palpitations and leg swelling Denies abdominal pain, nausea, vomiting,diarrhea or constipation.   Denies dysuria, frequency, hesitancy or incontinence. Denies joint pain, swelling and limitation in mobility. Denies headaches, seizures, numbness, or tingling. Denies depression, anxiety or insomnia. Denies skin break down or rash.        Objective:   Physical Exam Patient alert and oriented and in no cardiopulmonary distress.  HEENT: No facial asymmetry, EOMI, maxillary  sinus tenderness,  oropharynx erythematous , no exudate and moist.  Neck supple no adenopathy.TM slightly dull, no erythema noted  Chest: Clear to auscultation bilaterally.  CVS: S1, S2 no murmurs, no S3.  ABD: Soft non tender. Bowel sounds normal.  Ext: No edema  MS: Adequate ROM spine, shoulders, hips and knees.  Skin: Intact, no ulcerations or rash noted.  Psych: Good eye contact, normal affect. Memory intact not anxious or depressed appearing.  CNS: CN 2-12 intact, power, tone and sensation normal throughout.        Assessment & Plan:

## 2012-11-25 NOTE — Patient Instructions (Addendum)
F/u in 4 month, call if you need me before  It is important that you exercise regularly at least 30 minutes 5 times a week. If you develop chest pain, have severe difficulty breathing, or feel very tired, stop exercising immediately and seek medical attention    The patient is asked to make an attempt to improve diet and exercise patterns to aid in medical management of this problem.   Z pack sent in for sinus symptoms.Also fluconazole Throat swab today  Weight loss goal of 2.5 pounds per month  Fasting lipid,cbc and HBA1C in 4 month

## 2012-12-06 DIAGNOSIS — J0100 Acute maxillary sinusitis, unspecified: Secondary | ICD-10-CM | POA: Insufficient documentation

## 2012-12-06 NOTE — Assessment & Plan Note (Signed)
z pack prescribed 

## 2012-12-06 NOTE — Assessment & Plan Note (Signed)
Marked symptom improvement s/p cholecystectomy, uses med as needed

## 2012-12-06 NOTE — Assessment & Plan Note (Signed)
Deteriorated. Hyperlipidemia:Low fat diet discussed and encouraged.  No meds at this time 

## 2012-12-06 NOTE — Assessment & Plan Note (Signed)
Controlled, no change in medication  

## 2012-12-06 NOTE — Assessment & Plan Note (Signed)
Deteriorated. Patient re-educated about  the importance of commitment to a  minimum of 150 minutes of exercise per week. The importance of healthy food choices with portion control discussed. Encouraged to start a food diary, count calories and to consider  joining a support group. Sample diet sheets offered. Goals set by the patient for the next several months.    

## 2012-12-06 NOTE — Assessment & Plan Note (Signed)
Symptomatic with negative rapid strep, will treat based on symptoms

## 2013-02-18 ENCOUNTER — Other Ambulatory Visit: Payer: Self-pay

## 2013-03-25 ENCOUNTER — Ambulatory Visit: Payer: 59 | Admitting: Family Medicine

## 2013-05-13 ENCOUNTER — Encounter: Payer: Self-pay | Admitting: Family Medicine

## 2013-05-14 ENCOUNTER — Other Ambulatory Visit: Payer: Self-pay | Admitting: Family Medicine

## 2013-05-14 MED ORDER — OSELTAMIVIR PHOSPHATE 75 MG PO CAPS: 75.0000 mg | ORAL_CAPSULE | Freq: Two times a day (BID) | ORAL | Status: DC

## 2013-11-25 ENCOUNTER — Ambulatory Visit (INDEPENDENT_AMBULATORY_CARE_PROVIDER_SITE_OTHER): Payer: 59 | Admitting: Family Medicine

## 2013-11-25 ENCOUNTER — Encounter: Payer: Self-pay | Admitting: Family Medicine

## 2013-11-25 VITALS — BP 122/94 | HR 100 | Resp 18 | Ht 63.0 in | Wt 228.0 lb

## 2013-11-25 DIAGNOSIS — R002 Palpitations: Secondary | ICD-10-CM

## 2013-11-25 DIAGNOSIS — E669 Obesity, unspecified: Secondary | ICD-10-CM

## 2013-11-25 DIAGNOSIS — J301 Allergic rhinitis due to pollen: Secondary | ICD-10-CM

## 2013-11-25 DIAGNOSIS — R7309 Other abnormal glucose: Secondary | ICD-10-CM

## 2013-11-25 DIAGNOSIS — R7302 Impaired glucose tolerance (oral): Secondary | ICD-10-CM

## 2013-11-25 DIAGNOSIS — E785 Hyperlipidemia, unspecified: Secondary | ICD-10-CM

## 2013-11-25 DIAGNOSIS — Z23 Encounter for immunization: Secondary | ICD-10-CM | POA: Insufficient documentation

## 2013-11-25 DIAGNOSIS — K219 Gastro-esophageal reflux disease without esophagitis: Secondary | ICD-10-CM

## 2013-11-25 MED ORDER — EPINEPHRINE 0.3 MG/0.3ML IJ SOAJ: 0.3000 mg | Freq: Once | INTRAMUSCULAR | Status: DC

## 2013-11-25 MED ORDER — PHENTERMINE HCL 37.5 MG PO TBDP: 30.0000 | ORAL_TABLET | Freq: Every morning | ORAL | Status: DC

## 2013-11-25 MED ORDER — PANTOPRAZOLE SODIUM 40 MG PO TBEC: 40.0000 mg | DELAYED_RELEASE_TABLET | Freq: Every day | ORAL | Status: DC

## 2013-11-25 MED ORDER — METOPROLOL TARTRATE 50 MG PO TABS: ORAL_TABLET | ORAL | Status: DC

## 2013-11-25 NOTE — Patient Instructions (Addendum)
F/u in January, call if you need me bfefore  TdAp today  Congrats on success of your daughter and all the best as your home adjusts to the change  Fasting CBC, lipid, chem 7, TSH and HBA1C as soon as possible, past due  It is important that you exercise regularly at least 30 minutes 5 times a week. If you develop chest pain, have severe difficulty breathing, or feel very tired, stop exercising immediately and seek medical attention   A healthy diet is rich in fruit, vegetables and whole grains. Poultry fish, nuts and beans are a healthy choice for protein rather then red meat. A low sodium diet and drinking 64 ounces of water daily is generally recommended. Oils and sweet should be limited. Carbohydrates especially for those who are diabetic or overweight, should be limited to 60-45 gram per meal. It is important to eat on a regular schedule, at least 3 times daily. Snacks should be primarily fruits, vegetables or nuts.   Weight loss goal of 3 pounds per month

## 2013-11-25 NOTE — Assessment & Plan Note (Signed)
Controlled, no change in medication  

## 2013-11-25 NOTE — Progress Notes (Signed)
   Subjective:    Patient ID: [name], female    DOB: Dec 21, 1980, 33 y.o.   MRN: [id number]  HPI The PT is here for follow up and re-evaluation of chronic medical conditions, medication management and review of any available recent lab and radiology data.  Preventive health is updated, specifically  Cancer screening and Immunization.    concerns regarding consultations or procedures which the PT has had in the interim are  addressed. The PT denies any adverse reactions to current medications since the last visit.  There are no new concerns. Needs medication refilled There are no specific complaints       Review of Systems See HPI Denies recent fever or chills. Denies sinus pressure, nasal congestion, ear pain or sore throat.Good results with zyrtec as needed Denies chest congestion, productive cough or wheezing. Denies chest pains, PND, orthopnea and leg swelling Denies  vomiting,diarrhea or constipation.   Denies dysuria, frequency, hesitancy or incontinence. Denies joint pain, swelling and limitation in mobility. Denies headaches, seizures, numbness, or tingling. Denies depression, anxiety or insomnia. Denies skin break down or rash.         Objective:   Physical Exam BP 122/94  Pulse 100  Resp 18  Ht 5\' 3"  (1.6 m)  Wt 228 lb 0.6 oz (103.438 kg)  BMI 40.41 kg/m2  SpO2 97% Patient alert and oriented and in no cardiopulmonary distress.  HEENT: No facial asymmetry, EOMI,   oropharynx pink and moist.  Neck supple no JVD, no mass.  Chest: Clear to auscultation bilaterally.  CVS: S1, S2 no murmurs, no S3.Regular rate.  ABD: Soft non tender.   Ext: No edema  MS: Adequate ROM spine, shoulders, hips and knees.  Skin: Intact, no ulcerations or rash noted.  Psych: Good eye contact, normal affect. Memory intact not anxious or depressed appearing.  CNS: CN 2-12 intact, power,  normal throughout.no focal deficits noted.        Assessment & Plan:    Hyperlipidemia Updated lab needed at/ before next visit. Hyperlipidemia:Low fat diet discussed and encouraged.    Palpitations Intermittent palpitations which she uses metoprolol intermittently   GERD (gastroesophageal reflux disease) Uncontrolled , needs to resume medciation  ALLERGIC RHINITIS, SEASONAL Controlled, no change in medication   Obesity (BMI 30-39.9) Deteriorated. Patient re-educated about  the importance of commitment to a  minimum of 150 minutes of exercise per week. The importance of healthy food choices with portion control discussed. Encouraged to start a food diary, count calories and to consider  joining a support group. Sample diet sheets offered. Goals set by the patient for the next several months.     Need for Tdap vaccination Vaccine administered

## 2013-11-25 NOTE — Assessment & Plan Note (Signed)
Intermittent palpitations which she uses metoprolol intermittently

## 2013-11-25 NOTE — Assessment & Plan Note (Signed)
Updated lab needed at/ before next visit. Hyperlipidemia:Low fat diet discussed and encouraged.   

## 2013-11-25 NOTE — Assessment & Plan Note (Signed)
Uncontrolled , needs to resume medciation

## 2013-11-25 NOTE — Assessment & Plan Note (Signed)
Deteriorated. Patient re-educated about  the importance of commitment to a  minimum of 150 minutes of exercise per week. The importance of healthy food choices with portion control discussed. Encouraged to start a food diary, count calories and to consider  joining a support group. Sample diet sheets offered. Goals set by the patient for the next several months.    

## 2013-11-25 NOTE — Assessment & Plan Note (Signed)
Vaccine administered.

## 2014-02-24 LAB — CBC
HCT: 40.2 % (ref 36.0–46.0)
Hemoglobin: 13.1 g/dL (ref 12.0–15.0)
MCH: 27.3 pg (ref 26.0–34.0)
MCHC: 32.6 g/dL (ref 30.0–36.0)
MCV: 83.8 fL (ref 78.0–100.0)
Platelets: 380 10*3/uL (ref 150–400)
RBC: 4.8 MIL/uL (ref 3.87–5.11)
RDW: 14.1 % (ref 11.5–15.5)
WBC: 8.6 10*3/uL (ref 4.0–10.5)

## 2014-02-25 ENCOUNTER — Encounter: Payer: Self-pay | Admitting: Family Medicine

## 2014-02-25 LAB — BASIC METABOLIC PANEL
BUN: 18 mg/dL (ref 6–23)
CO2: 25 mEq/L (ref 19–32)
Calcium: 9.3 mg/dL (ref 8.4–10.5)
Chloride: 105 mEq/L (ref 96–112)
Creat: 0.69 mg/dL (ref 0.50–1.10)
Glucose, Bld: 91 mg/dL (ref 70–99)
Potassium: 4.2 mEq/L (ref 3.5–5.3)
Sodium: 138 mEq/L (ref 135–145)

## 2014-02-25 LAB — LIPID PANEL
Cholesterol: 193 mg/dL (ref 0–200)
HDL: 43 mg/dL (ref >39)
LDL Cholesterol: 116 mg/dL — ABNORMAL HIGH (ref 0–99)
Total CHOL/HDL Ratio: 4.5 Ratio
Triglycerides: 171 mg/dL — ABNORMAL HIGH (ref <150)
VLDL: 34 mg/dL (ref 0–40)

## 2014-02-25 LAB — TSH: TSH: 2.735 u[IU]/mL (ref 0.350–4.500)

## 2014-02-25 LAB — HEMOGLOBIN A1C
Hgb A1c MFr Bld: 5.6 % (ref <5.7)
Mean Plasma Glucose: 114 mg/dL (ref <117)

## 2014-04-20 ENCOUNTER — Ambulatory Visit (INDEPENDENT_AMBULATORY_CARE_PROVIDER_SITE_OTHER): Payer: 59 | Admitting: Family Medicine

## 2014-04-20 ENCOUNTER — Encounter: Payer: Self-pay | Admitting: Family Medicine

## 2014-04-20 VITALS — BP 118/84 | HR 80 | Resp 16 | Ht 63.0 in | Wt 235.0 lb

## 2014-04-20 DIAGNOSIS — M5410 Radiculopathy, site unspecified: Secondary | ICD-10-CM

## 2014-04-20 DIAGNOSIS — K219 Gastro-esophageal reflux disease without esophagitis: Secondary | ICD-10-CM

## 2014-04-20 DIAGNOSIS — G4700 Insomnia, unspecified: Secondary | ICD-10-CM

## 2014-04-20 DIAGNOSIS — R002 Palpitations: Secondary | ICD-10-CM

## 2014-04-20 DIAGNOSIS — G8929 Other chronic pain: Secondary | ICD-10-CM

## 2014-04-20 DIAGNOSIS — E669 Obesity, unspecified: Secondary | ICD-10-CM

## 2014-04-20 DIAGNOSIS — J301 Allergic rhinitis due to pollen: Secondary | ICD-10-CM

## 2014-04-20 DIAGNOSIS — M545 Low back pain, unspecified: Secondary | ICD-10-CM | POA: Insufficient documentation

## 2014-04-20 MED ORDER — TEMAZEPAM 15 MG PO CAPS: 15.0000 mg | ORAL_CAPSULE | Freq: Every evening | ORAL | Status: DC | PRN

## 2014-04-20 NOTE — Assessment & Plan Note (Signed)
protonix as needed, approx twice weekly

## 2014-04-20 NOTE — Patient Instructions (Signed)
F/u in 4 month  Goal is 6 hours of sleep  Goal of exercise 3 days per week for 30 mins  Make and follow eating plan  Please reduce ibuprofen  Use and change  to tylenol

## 2014-04-20 NOTE — Assessment & Plan Note (Signed)
Occasional, uses metoprolol as needed, does use caffeine at work to help herself keep alert

## 2014-04-20 NOTE — Assessment & Plan Note (Signed)
Deteriorated. Patient re-educated about  the importance of commitment to a  minimum of 150 minutes of exercise per week. The importance of healthy food choices with portion control discussed. Encouraged to start a food diary, count calories and to consider  joining a support group. Sample diet sheets offered. Goals set by the patient for the next several months.    

## 2014-04-20 NOTE — Assessment & Plan Note (Signed)
Sleeps approx 4 hrs per night even when not working,will work on steady and not swing shift, start restoril for help with improved sleep

## 2014-04-20 NOTE — Assessment & Plan Note (Signed)
Occurs at end of work for 12 hours, ranges from 3 to 6, uses ibuprofen 800 mg twice daily for 3 days per week, advised to start tylenol 500 mg up to 4 gm per 24 hrs safe but not on regular basis

## 2014-04-20 NOTE — Progress Notes (Signed)
   Subjective:    Patient ID: Rolan BuccoJennifer M Dolata, female    DOB: 14-Feb-1981, 34 y.o.   MRN: [id number]  HPI The PT is here for follow up and re-evaluation of chronic medical conditions, medication management and review of any available recent lab and radiology data.  Preventive health is updated, specifically  Cancer screening and Immunization.    The PT denies any adverse reactions to current medications since the last visit.  There are no new concerns.  Main concern is poor sleep. She will work on dietary change and exercise to address weight     Review of Systems See HPI Denies recent fever or chills. Denies sinus pressure, nasal congestion, ear pain or sore throat. Denies chest congestion, productive cough or wheezing. Denies chest pains, PND , orthopnea and leg swelling. Does have occasional palpitations Denies abdominal pain, nausea, vomiting,diarrhea or constipation.   Denies dysuria, frequency, hesitancy or incontinence. Chronic back pain mainly at the end of a work shiift Denies headaches, seizures, numbness, or tingling. Denies depression, anxiety does have  Insomnia, mainly due to irregular and long work hours Denies skin break down or rash.        Objective:   Physical Exam BP 118/84 mmHg  Pulse 80  Resp 16  Ht 5\' 3"  (1.6 m)  Wt 235 lb (106.595 kg)  BMI 41.64 kg/m2  SpO2 99% Patient alert and oriented and in no cardiopulmonary distress.  HEENT: No facial asymmetry, EOMI,   oropharynx pink and moist.  Neck supple no JVD, no mass.  Chest: Clear to auscultation bilaterally.  CVS: S1, S2 no murmurs, no S3.Regular rate.  ABD: Soft non tender.   Ext: No edema  MS: Adequate ROM spine, shoulders, hips and knees.  Skin: Intact, no ulcerations or rash noted.  Psych: Good eye contact, normal affect. Memory intact not anxious or depressed appearing.  CNS: CN 2-12 intact, power,  normal throughout.no focal deficits noted.        Assessment & Plan:    Insomnia Sleeps approx 4 hrs per night even when not working,will work on steady and not swing shift, start restoril for help with improved sleep  Palpitations Occasional, uses metoprolol as needed, does use caffeine at work to help herself keep alert  Obesity (BMI 30-39.9) Deteriorated. Patient re-educated about  the importance of commitment to a  minimum of 150 minutes of exercise per week. The importance of healthy food choices with portion control discussed. Encouraged to start a food diary, count calories and to consider  joining a support group. Sample diet sheets offered. Goals set by the patient for the next several months.     Chronic LBP Occurs at end of work for 12 hours, ranges from 3 to 6, uses ibuprofen 800 mg twice daily for 3 days per week, advised to start tylenol 500 mg up to 4 gm per 24 hrs safe but not on regular basis  GERD (gastroesophageal reflux disease) protonix as needed, approx twice weekly  ALLERGIC RHINITIS, SEASONAL calritin as needed is sufficient at this time

## 2014-04-20 NOTE — Assessment & Plan Note (Addendum)
calritin as needed is sufficient at this time

## 2014-08-25 ENCOUNTER — Encounter: Payer: Self-pay | Admitting: Family Medicine

## 2014-08-25 ENCOUNTER — Ambulatory Visit (INDEPENDENT_AMBULATORY_CARE_PROVIDER_SITE_OTHER): Payer: 59 | Admitting: Family Medicine

## 2014-08-25 VITALS — BP 114/80 | HR 84 | Resp 16 | Ht 63.0 in | Wt 235.4 lb

## 2014-08-25 DIAGNOSIS — G43001 Migraine without aura, not intractable, with status migrainosus: Secondary | ICD-10-CM | POA: Diagnosis not present

## 2014-08-25 DIAGNOSIS — E785 Hyperlipidemia, unspecified: Secondary | ICD-10-CM | POA: Diagnosis not present

## 2014-08-25 DIAGNOSIS — R002 Palpitations: Secondary | ICD-10-CM

## 2014-08-25 DIAGNOSIS — G43909 Migraine, unspecified, not intractable, without status migrainosus: Secondary | ICD-10-CM | POA: Insufficient documentation

## 2014-08-25 DIAGNOSIS — G4700 Insomnia, unspecified: Secondary | ICD-10-CM

## 2014-08-25 DIAGNOSIS — J301 Allergic rhinitis due to pollen: Secondary | ICD-10-CM

## 2014-08-25 DIAGNOSIS — K219 Gastro-esophageal reflux disease without esophagitis: Secondary | ICD-10-CM

## 2014-08-25 MED ORDER — ONDANSETRON HCL 4 MG/2ML IJ SOLN
4.0000 mg | Freq: Once | INTRAMUSCULAR | Status: AC
  Administered 2014-08-25: 4 mg via INTRAMUSCULAR

## 2014-08-25 MED ORDER — METHYLPREDNISOLONE ACETATE 80 MG/ML IJ SUSP
80.0000 mg | Freq: Once | INTRAMUSCULAR | Status: AC
  Administered 2014-08-25: 80 mg via INTRAMUSCULAR

## 2014-08-25 MED ORDER — KETOROLAC TROMETHAMINE 60 MG/2ML IM SOLN
60.0000 mg | Freq: Once | INTRAMUSCULAR | Status: AC
  Administered 2014-08-25: 60 mg via INTRAMUSCULAR

## 2014-08-25 MED ORDER — BUTALBITAL-APAP-CAFFEINE 50-325-40 MG PO TABS: ORAL_TABLET | ORAL | Status: DC

## 2014-08-25 NOTE — Patient Instructions (Signed)
F/u in 4 month, call if you need me before  Toradol and depo medrol and zofran today for headache, and fioricet prescribed for as needed use for severe headache, OK to use with ibuprofen  Start metoproll; 50 mg HALF twice daily for palpitations , call if problems develop.  All the best  [facility], we consider it a privelige to serve you.

## 2014-08-26 NOTE — Progress Notes (Signed)
Subjective:    Patient ID: Wanda BuccoJennifer M Andrews, female    DOB: Nov 09, 1980, 34 y.o.   MRN: [id number]  HPI The PT is here for follow up and re-evaluation of chronic medical conditions, medication management and review of any available recent lab and radiology data.  Preventive health is updated, specifically  Cancer screening and Immunization.    The PT denies any adverse reactions to current medications since the last visit.  1 day h/o pounding frontal headache with nausea, her headaches are infrequent generally not more often then one in 6 to 8 weeks, has used tylenol or ibuprofen with relief most of the time. No known trigger for current headache C/o daily palpitations, denies anxiety or panic attacks, stopped metoprolol some time back as it was causing low blood pressure Working on lifestyle change for weight loss, no improvement as yet      Review of Systems See HPI Denies recent fever or chills. Denies sinus pressure, nasal congestion, ear pain or sore throat. Denies chest congestion, productive cough or wheezing. Denies chest pains,PND, orthopnea and leg swelling Denies abdominal pain, nausea, vomiting,diarrhea or constipation.   Denies dysuria, frequency, hesitancy or incontinence. Denies joint pain, swelling and limitation in mobility. Denies  seizures, numbness, or tingling. Denies depression, anxiety or uncontrolled  insomnia. Denies skin break down or rash.        Objective:   Physical Exam  BP 114/80 mmHg  Pulse 84  Resp 16  Ht 5\' 3"  (1.6 m)  Wt 235 lb 6.4 oz (106.777 kg)  BMI 41.71 kg/m2  SpO2 98% Patient alert and oriented and in no cardiopulmonary distress.Pt in pain from headache  HEENT: No facial asymmetry, EOMI,   oropharynx pink and moist.  Neck supple no JVD, no mass.  Chest: Clear to auscultation bilaterally.  CVS: S1, S2 no murmurs, no S3.Regular rate.  ABD: Soft non tender.   Ext: No edema  MS: Adequate ROM spine, shoulders, hips and  knees.  Skin: Intact, no ulcerations or rash noted.  Psych: Good eye contact, normal affect. Memory intact not anxious or depressed appearing.  CNS: CN 2-12 intact, power,  normal throughout.no focal deficits noted.       Assessment & Plan:  Migraine headache Acute episode on day of visit with nausea, IM medications administered, pt also provided with limited supply of fioricet for as needed / restricted use. She is to preferentially start with tylenol and ibuprofen  Neurologic exam is normal  At visit   Palpitations reports palpitations at least 4 to 5 days per week, has not been using any metoprolol as she was told the last time she saw cardiology that it was making her BP too low. She is advised today to start half the dose twice daily, will need nurse BP re check in 4 to 6 weeks and is to call in if she becomes symptomatic on this dose of medication If she is unable to tolerate, she will be redirected for cardiology re eval She denies chest pain, PND, orthopnea, or light headedness with the palpitations. No symptoms of anxiety or panic. No regular caffeine use except 1 cup on the days that she works, sh has palpitations every day to the same extent   Insomnia Improved, with change in work shift, uses medication infrequently   Hyperlipidemia Hyperlipidemia:Low fat diet discussed and encouraged.   Lipid Panel  Lab Results  Component Value Date   CHOL 193 02/24/2014   HDL 43 02/24/2014   LDLCALC 116*  02/24/2014   TRIG 171* 02/24/2014   CHOLHDL 4.5 02/24/2014         Morbid obesity Deteriorated.Pt has logged in through her job to a wellness program and has been more committed to exercise and dietary change in the past  4 weeks Patient re-educated about  the importance of commitment to a  minimum of 150 minutes of exercise per week.  The importance of healthy food choices with portion control discussed. Encouraged to start a food diary, count calories and to  consider  joining a support group. Sample diet sheets offered. Goals set by the patient for the next several months.   Weight /BMI 08/25/2014 04/20/2014 11/25/2013  WEIGHT 235 lb 6.4 oz 235 lb 228 lb 0.6 oz  HEIGHT 5\' 3"  5\' 3"  5\' 3"   BMI 41.71 kg/m2 41.64 kg/m2 40.41 kg/m2    Current exercise per week 90 minutes.    ALLERGIC RHINITIS, SEASONAL Infrequent need for use of loratidine this year so far, but is effective when used   GERD (gastroesophageal reflux disease) Infrequent use, varies with diet , on avg 4 times per month

## 2014-08-26 NOTE — Assessment & Plan Note (Signed)
Improved, with change in work shift, uses medication infrequently

## 2014-08-26 NOTE — Assessment & Plan Note (Signed)
Hyperlipidemia:Low fat diet discussed and encouraged.   Lipid Panel  Lab Results  Component Value Date   CHOL 193 02/24/2014   HDL 43 02/24/2014   LDLCALC 116* 02/24/2014   TRIG 171* 02/24/2014   CHOLHDL 4.5 02/24/2014

## 2014-08-26 NOTE — Assessment & Plan Note (Signed)
Deteriorated.Pt has logged in through her job to a wellness program and has been more committed to exercise and dietary change in the past  4 weeks Patient re-educated about  the importance of commitment to a  minimum of 150 minutes of exercise per week.  The importance of healthy food choices with portion control discussed. Encouraged to start a food diary, count calories and to consider  joining a support group. Sample diet sheets offered. Goals set by the patient for the next several months.   Weight /BMI 08/25/2014 04/20/2014 11/25/2013  WEIGHT 235 lb 6.4 oz 235 lb 228 lb 0.6 oz  HEIGHT 5\' 3"  5\' 3"  5\' 3"   BMI 41.71 kg/m2 41.64 kg/m2 40.41 kg/m2    Current exercise per week 90 minutes.

## 2014-08-26 NOTE — Assessment & Plan Note (Signed)
reports palpitations at least 4 to 5 days per week, has not been using any metoprolol as she was told the last time she saw cardiology that it was making her BP too low. She is advised today to start half the dose twice daily, will need nurse BP re check in 4 to 6 weeks and is to call in if she becomes symptomatic on this dose of medication If she is unable to tolerate, she will be redirected for cardiology re eval She denies chest pain, PND, orthopnea, or light headedness with the palpitations. No symptoms of anxiety or panic. No regular caffeine use except 1 cup on the days that she works, sh has palpitations every day to the same extent

## 2014-08-26 NOTE — Assessment & Plan Note (Signed)
Acute episode on day of visit with nausea, IM medications administered, pt also provided with limited supply of fioricet for as needed / restricted use. She is to preferentially start with tylenol and ibuprofen  Neurologic exam is normal  At visit

## 2014-08-26 NOTE — Assessment & Plan Note (Signed)
Infrequent use, varies with diet , on avg 4 times per month

## 2014-08-26 NOTE — Assessment & Plan Note (Signed)
Infrequent need for use of loratidine this year so far, but is effective when used

## 2014-12-28 ENCOUNTER — Ambulatory Visit (INDEPENDENT_AMBULATORY_CARE_PROVIDER_SITE_OTHER): Payer: 59 | Admitting: Family Medicine

## 2014-12-28 ENCOUNTER — Encounter: Payer: Self-pay | Admitting: Family Medicine

## 2014-12-28 VITALS — BP 118/84 | HR 85 | Resp 16 | Ht 63.0 in | Wt 242.8 lb

## 2014-12-28 DIAGNOSIS — R7302 Impaired glucose tolerance (oral): Secondary | ICD-10-CM | POA: Diagnosis not present

## 2014-12-28 DIAGNOSIS — Z114 Encounter for screening for human immunodeficiency virus [HIV]: Secondary | ICD-10-CM | POA: Diagnosis not present

## 2014-12-28 DIAGNOSIS — G4700 Insomnia, unspecified: Secondary | ICD-10-CM

## 2014-12-28 DIAGNOSIS — K219 Gastro-esophageal reflux disease without esophagitis: Secondary | ICD-10-CM

## 2014-12-28 DIAGNOSIS — R002 Palpitations: Secondary | ICD-10-CM

## 2014-12-28 DIAGNOSIS — E785 Hyperlipidemia, unspecified: Secondary | ICD-10-CM

## 2014-12-28 DIAGNOSIS — J301 Allergic rhinitis due to pollen: Secondary | ICD-10-CM

## 2014-12-28 DIAGNOSIS — G43019 Migraine without aura, intractable, without status migrainosus: Secondary | ICD-10-CM

## 2014-12-28 MED ORDER — TEMAZEPAM 30 MG PO CAPS
30.0000 mg | ORAL_CAPSULE | Freq: Every evening | ORAL | Status: DC | PRN
Start: 2014-12-28 — End: 2015-03-03

## 2014-12-28 NOTE — Progress Notes (Signed)
   Subjective:    Patient ID: [name], female    DOB: [birth year], 34 y.o.   MRN: [id number]  HPI    Review of Systems     Objective:   Physical Exam        Assessment & Plan:

## 2014-12-28 NOTE — Patient Instructions (Addendum)
F/u in 4 month , call if you need me before  Fasting CBc, lipid, chem 7, HBa1C, TSH and HIV on11/13 or shortly after    Meds as discussed  Please work on good  health habits so that your health will improve. 1. Commitment to daily physical activity for 30 to 60  minutes, if you are able to do this.  2. Commitment to wise food choices. Aim for half of your  food intake to be vegetable and fruit, one quarter starchy foods, and one quarter protein. Try to eat on a regular schedule  3 meals per day, snacking between meals should be limited to vegetables or fruits or small portions of nuts. 64 ounces of water per day is generally recommended, unless you have specific health conditions, like heart failure or kidney failure where you will need to limit fluid intake.  3. Commitment to sufficient and a  good quality of physical and mental rest daily, generally between 6 to 8 hours per day.  WITH PERSISTANCE AND PERSEVERANCE, THE IMPOSSIBLE , BECOMES THE NORM!   Thanks for choosing [facility], we consider it a privelige to serve you.

## 2015-01-01 NOTE — Assessment & Plan Note (Signed)
Aggravated by certain foods , hence ;less frequent, as needed, medication use

## 2015-01-01 NOTE — Assessment & Plan Note (Signed)
Improved, varies with number of hours she works Uses medication on avg twice per week Sleep hygiene reviewed and written information offered also. Prescription sent for  medication needed.

## 2015-01-01 NOTE — Progress Notes (Signed)
   [name]     MRN: [id number]      DOB: 31-Oct-1980   HPI [name] is here for follow up and re-evaluation of chronic medical conditions, medication management and review of any available recent lab and radiology data.  Preventive health is updated, specifically  Cancer screening and Immunization.   . The PT denies any adverse reactions to current medications since the last visit.  There are no new concerns.  There are no specific complaints   ROS Denies recent fever or chills. Denies sinus pressure, nasal congestion, ear pain or sore throat. Denies chest congestion, productive cough or wheezing. Denies chest pains, palpitations and leg swelling Denies abdominal pain, nausea, vomiting,diarrhea or constipation.   Denies dysuria, frequency, hesitancy or incontinence. Denies joint pain, swelling and limitation in mobility. Denies headaches, seizures, numbness, or tingling. Denies depression, anxiety or insomnia. Denies skin break down or rash.   PE  BP 118/84 mmHg  Pulse 85  Resp 16  Ht  (1.6 m)  Wt 242 lb 12.8 oz (110.133 kg)  BMI 43.02 kg/m2  SpO2 98%  Patient alert and oriented and in no cardiopulmonary distress.  HEENT: No facial asymmetry, EOMI,   oropharynx pink and moist.  Neck supple no JVD, no mass.  Chest: Clear to auscultation bilaterally.  CVS: S1, S2 no murmurs, no S3.Regular rate.  ABD: Soft non tender.   Ext: No edema  MS: Adequate ROM spine, shoulders, hips and knees.  Skin: Intact, no ulcerations or rash noted.  Psych: Good eye contact, normal affect. Memory intact not anxious or depressed appearing.  CNS: CN 2-12 intact, power,  normal throughout.no focal deficits noted.   Assessment & Plan  Migraine headache Very in frequent episodes, controlled with ibuprofen  Morbid obesity Deteriorated. Patient re-educated about  the importance of commitment to a  minimum of 150 minutes of exercise per week.  The importance of healthy  food choices with portion control discussed. Encouraged to start a food diary, count calories and to consider  joining a support group. Sample diet sheets offered. Goals set by the patient for the next several months.   Weight /BMI 12/28/2014 08/25/2014 04/20/2014  WEIGHT 242 lb 12.8 oz 235 lb 6.4 oz 235 lb  HEIGHT     BMI 43.02 kg/m2 41.71 kg/m2 41.64 kg/m2    Current exercise per week 60 minutes.   Insomnia Improved, varies with number of hours she works Uses medication on avg twice per week Sleep hygiene reviewed and written information offered also. Prescription sent for  medication needed.   Hyperlipidemia Hyperlipidemia:Low fat diet discussed and encouraged.   Lipid Panel  Lab Results  Component Value Date   CHOL 193 02/24/2014   HDL 43 02/24/2014   LDLCALC 116* 02/24/2014   TRIG 171* 02/24/2014   CHOLHDL 4.5 02/24/2014      Updated lab needed at/ before next visit.   Palpitations Infrequent, uses beta blocker as needed only  GERD (gastroesophageal reflux disease) Aggravated by certain foods , hence ;less frequent, as needed, medication use  ALLERGIC RHINITIS, SEASONAL No current flare, responds to OTC med when flares

## 2015-01-01 NOTE — Assessment & Plan Note (Signed)
Infrequent, uses beta blocker as needed only

## 2015-01-01 NOTE — Assessment & Plan Note (Signed)
Very in frequent episodes, controlled with ibuprofen

## 2015-01-01 NOTE — Assessment & Plan Note (Signed)
No current flare, responds to OTC med when flares

## 2015-01-01 NOTE — Assessment & Plan Note (Signed)
Deteriorated. Patient re-educated about  the importance of commitment to a  minimum of 150 minutes of exercise per week.  The importance of healthy food choices with portion control discussed. Encouraged to start a food diary, count calories and to consider  joining a support group. Sample diet sheets offered. Goals set by the patient for the next several months.   Weight /BMI 12/28/2014 08/25/2014 04/20/2014  WEIGHT 242 lb 12.8 oz 235 lb 6.4 oz 235 lb  HEIGHT     BMI 43.02 kg/m2 41.71 kg/m2 41.64 kg/m2    Current exercise per week 60 minutes.

## 2015-01-01 NOTE — Assessment & Plan Note (Signed)
Hyperlipidemia:Low fat diet discussed and encouraged.   Lipid Panel  Lab Results  Component Value Date   CHOL 193 02/24/2014   HDL 43 02/24/2014   LDLCALC 116* 02/24/2014   TRIG 171* 02/24/2014   CHOLHDL 4.5 02/24/2014      Updated lab needed at/ before next visit.

## 2015-02-15 ENCOUNTER — Other Ambulatory Visit: Payer: Self-pay | Admitting: Adult Health

## 2015-03-03 ENCOUNTER — Other Ambulatory Visit (HOSPITAL_COMMUNITY)
Admission: RE | Admit: 2015-03-03 | Discharge: 2015-03-03 | Disposition: A | Discharge status: Home / Self Care | Payer: 59 | Source: Ambulatory Visit | Attending: Adult Health | Admitting: Adult Health

## 2015-03-03 ENCOUNTER — Ambulatory Visit (INDEPENDENT_AMBULATORY_CARE_PROVIDER_SITE_OTHER): Payer: 59 | Admitting: Adult Health

## 2015-03-03 ENCOUNTER — Encounter: Payer: Self-pay | Admitting: Adult Health

## 2015-03-03 VITALS — BP 100/68 | HR 74 | Ht 62.5 in | Wt 243.2 lb

## 2015-03-03 DIAGNOSIS — Z01419 Encounter for gynecological examination (general) (routine) without abnormal findings: Secondary | ICD-10-CM | POA: Diagnosis present

## 2015-03-03 DIAGNOSIS — Z1151 Encounter for screening for human papillomavirus (HPV): Secondary | ICD-10-CM | POA: Diagnosis not present

## 2015-03-03 NOTE — Patient Instructions (Signed)
Physical in 1 year Mammogram at 42 Pap in 3 if normal

## 2015-03-03 NOTE — Progress Notes (Signed)
Patient ID: Wanda BuccoJennifer M Andrews, female   DOB: [birth year], 34 y.o.   MRN: [id number] History of Present Illness: [name] is a 34 year old white female,married,G4P2,in for a well woman gyn exam and pap. She got her flu shot 12/25/14 at work. PCP is   Current Medications, Allergies, Past Medical History, Past Surgical History, Family History and Social History were reviewed in [facility] electronic medical record.     Review of Systems: Patient denies any daily headaches, hearing loss, fatigue, blurred vision, shortness of breath, chest pain, abdominal pain, problems with bowel movements, urination, or intercourse. No joint swelling has chronic pain, or mood swings.    Physical Exam:BP 100/68 mmHg  Pulse 74  Ht 5' 2.5" (1.588 m)  Wt 243 lb 3.2 oz (110.315 kg)  BMI 43.75 kg/m2  LMP 02/14/2015 General:  Well developed, well nourished, no acute distress Skin:  Warm and dry Neck:  Midline trachea, normal thyroid, good ROM, no lymphadenopathy Lungs; Clear to auscultation bilaterally Breast:  No dominant palpable mass, retraction, or nipple discharge Cardiovascular: Regular rate and rhythm Abdomen:  Soft, non tender, no hepatosplenomegaly,obese Pelvic:  External genitalia is normal in appearance, no lesions.  The vagina is normal in appearance.has left bartholin cyst,non tender. Urethra has no lesions or masses. The cervix is bulbous,pap with HPV performed.  Uterus is felt to be normal size, shape, and contour.  No adnexal masses or tenderness noted.Bladder is non tender, no masses felt. Extremities/musculoskeletal:  No swelling or varicosities noted, no clubbing or cyanosis Psych:  No mood changes, alert and cooperative,seems happy   Impression: Well woman gyn exam and pap    Plan: Physical in 1 year Pap in 3 if normal Mammogram at 42 Labs with PCP

## 2015-03-06 LAB — CYTOLOGY - PAP

## 2015-05-03 ENCOUNTER — Ambulatory Visit: Payer: 59 | Admitting: Family Medicine

## 2015-05-16 ENCOUNTER — Encounter: Payer: Self-pay | Admitting: Adult Health

## 2015-05-16 ENCOUNTER — Ambulatory Visit (INDEPENDENT_AMBULATORY_CARE_PROVIDER_SITE_OTHER): Payer: 59 | Admitting: Adult Health

## 2015-05-16 VITALS — BP 120/90 | HR 92 | Ht 62.5 in | Wt 237.5 lb

## 2015-05-16 DIAGNOSIS — Z3490 Encounter for supervision of normal pregnancy, unspecified, unspecified trimester: Secondary | ICD-10-CM

## 2015-05-16 DIAGNOSIS — N898 Other specified noninflammatory disorders of vagina: Secondary | ICD-10-CM

## 2015-05-16 DIAGNOSIS — N939 Abnormal uterine and vaginal bleeding, unspecified: Secondary | ICD-10-CM

## 2015-05-16 DIAGNOSIS — Z3201 Encounter for pregnancy test, result positive: Secondary | ICD-10-CM | POA: Diagnosis not present

## 2015-05-16 DIAGNOSIS — O3680X0 Pregnancy with inconclusive fetal viability, not applicable or unspecified: Secondary | ICD-10-CM

## 2015-05-16 HISTORY — DX: Abnormal uterine and vaginal bleeding, unspecified: N93.9

## 2015-05-16 HISTORY — DX: Encounter for supervision of normal pregnancy, unspecified, unspecified trimester: Z34.90

## 2015-05-16 LAB — POCT URINE PREGNANCY: Preg Test, Ur: POSITIVE — AB

## 2015-05-16 NOTE — Patient Instructions (Signed)
First Trimester of Pregnancy The first trimester of pregnancy is from week 1 until the end of week 12 (months 1 through 3). A week after a sperm fertilizes an egg, the egg will implant on the wall of the uterus. This embryo will begin to develop into a baby. Genes from you and your partner are forming the baby. The female genes determine whether the baby is a boy or a girl. At 6-8 weeks, the eyes and face are formed, and the heartbeat can be seen on ultrasound. At the end of 12 weeks, all the baby's organs are formed.  Now that you are pregnant, you will want to do everything you can to have a healthy baby. Two of the most important things are to get good prenatal care and to follow your health care provider's instructions. Prenatal care is all the medical care you receive before the baby's birth. This care will help prevent, find, and treat any problems during the pregnancy and childbirth. BODY CHANGES Your body goes through many changes during pregnancy. The changes vary from woman to woman.   You may gain or lose a couple of pounds at first.  You may feel sick to your stomach (nauseous) and throw up (vomit). If the vomiting is uncontrollable, call your health care provider.  You may tire easily.  You may develop headaches that can be relieved by medicines approved by your health care provider.  You may urinate more often. Painful urination may mean you have a bladder infection.  You may develop heartburn as a result of your pregnancy.  You may develop constipation because certain hormones are causing the muscles that push waste through your intestines to slow down.  You may develop hemorrhoids or swollen, bulging veins (varicose veins).  Your breasts may begin to grow larger and become tender. Your nipples may stick out more, and the tissue that surrounds them (areola) may become darker.  Your gums may bleed and may be sensitive to brushing and flossing.  Dark spots or blotches (chloasma,  mask of pregnancy) may develop on your face. This will likely fade after the baby is born.  Your menstrual periods will stop.  You may have a loss of appetite.  You may develop cravings for certain kinds of food.  You may have changes in your emotions from day to day, such as being excited to be pregnant or being concerned that something may go wrong with the pregnancy and baby.  You may have more vivid and strange dreams.  You may have changes in your hair. These can include thickening of your hair, rapid growth, and changes in texture. Some women also have hair loss during or after pregnancy, or hair that feels dry or thin. Your hair will most likely return to normal after your baby is born. WHAT TO EXPECT AT YOUR PRENATAL VISITS During a routine prenatal visit:  You will be weighed to make sure you and the baby are growing normally.  Your blood pressure will be taken.  Your abdomen will be measured to track your baby's growth.  The fetal heartbeat will be listened to starting around week 10 or 12 of your pregnancy.  Test results from any previous visits will be discussed. Your health care provider may ask you:  How you are feeling.  If you are feeling the baby move.  If you have had any abnormal symptoms, such as leaking fluid, bleeding, severe headaches, or abdominal cramping.  If you are using any tobacco products,   including cigarettes, chewing tobacco, and electronic cigarettes.  If you have any questions. Other tests that may be performed during your first trimester include:  Blood tests to find your blood type and to check for the presence of any previous infections. They will also be used to check for low iron levels (anemia) and Rh antibodies. Later in the pregnancy, blood tests for diabetes will be done along with other tests if problems develop.  Urine tests to check for infections, diabetes, or protein in the urine.  An ultrasound to confirm the proper growth  and development of the baby.  An amniocentesis to check for possible genetic problems.  Fetal screens for spina bifida and Down syndrome.  You may need other tests to make sure you and the baby are doing well.  HIV (human immunodeficiency virus) testing. Routine prenatal testing includes screening for HIV, unless you choose not to have this test. HOME CARE INSTRUCTIONS  Medicines  Follow your health care provider's instructions regarding medicine use. Specific medicines may be either safe or unsafe to take during pregnancy.  Take your prenatal vitamins as directed.  If you develop constipation, try taking a stool softener if your health care provider approves. Diet  Eat regular, well-balanced meals. Choose a variety of foods, such as meat or vegetable-based protein, fish, milk and low-fat dairy products, vegetables, fruits, and whole grain breads and cereals. Your health care provider will help you determine the amount of weight gain that is right for you.  Avoid raw meat and uncooked cheese. These carry germs that can cause birth defects in the baby.  Eating four or five small meals rather than three large meals a day may help relieve nausea and vomiting. If you start to feel nauseous, eating a few soda crackers can be helpful. Drinking liquids between meals instead of during meals also seems to help nausea and vomiting.  If you develop constipation, eat more high-fiber foods, such as fresh vegetables or fruit and whole grains. Drink enough fluids to keep your urine clear or pale yellow. Activity and Exercise  Exercise only as directed by your health care provider. Exercising will help you:  Control your weight.  Stay in shape.  Be prepared for labor and delivery.  Experiencing pain or cramping in the lower abdomen or low back is a good sign that you should stop exercising. Check with your health care provider before continuing normal exercises.  Try to avoid standing for long  periods of time. Move your legs often if you must stand in one place for a long time.  Avoid heavy lifting.  Wear low-heeled shoes, and practice good posture.  You may continue to have sex unless your health care provider directs you otherwise. Relief of Pain or Discomfort  Wear a good support bra for breast tenderness.   Take warm sitz baths to soothe any pain or discomfort caused by hemorrhoids. Use hemorrhoid cream if your health care provider approves.   Rest with your legs elevated if you have leg cramps or low back pain.  If you develop varicose veins in your legs, wear support hose. Elevate your feet for 15 minutes, 3-4 times a day. Limit salt in your diet. Prenatal Care  Schedule your prenatal visits by the twelfth week of pregnancy. They are usually scheduled monthly at first, then more often in the last 2 months before delivery.  Write down your questions. Take them to your prenatal visits.  Keep all your prenatal visits as directed by your   health care provider. Safety  Wear your seat belt at all times when driving.  Make a list of emergency phone numbers, including numbers for family, friends, the hospital, and police and fire departments. General Tips  Ask your health care provider for a referral to a local prenatal education class. Begin classes no later than at the beginning of month 6 of your pregnancy.  Ask for help if you have counseling or nutritional needs during pregnancy. Your health care provider can offer advice or refer you to specialists for help with various needs.  Do not use hot tubs, steam rooms, or saunas.  Do not douche or use tampons or scented sanitary pads.  Do not cross your legs for long periods of time.  Avoid cat litter boxes and soil used by cats. These carry germs that can cause birth defects in the baby and possibly loss of the fetus by miscarriage or stillbirth.  Avoid all smoking, herbs, alcohol, and medicines not prescribed by  your health care provider. Chemicals in these affect the formation and growth of the baby.  Do not use any tobacco products, including cigarettes, chewing tobacco, and electronic cigarettes. If you need help quitting, ask your health care provider. You may receive counseling support and other resources to help you quit.  Schedule a dentist appointment. At home, brush your teeth with a soft toothbrush and be gentle when you floss. SEEK MEDICAL CARE IF:   You have dizziness.  You have mild pelvic cramps, pelvic pressure, or nagging pain in the abdominal area.  You have persistent nausea, vomiting, or diarrhea.  You have a bad smelling vaginal discharge.  You have pain with urination.  You notice increased swelling in your face, hands, legs, or ankles. SEEK IMMEDIATE MEDICAL CARE IF:   You have a fever.  You are leaking fluid from your vagina.  You have spotting or bleeding from your vagina.  You have severe abdominal cramping or pain.  You have rapid weight gain or loss.  You vomit blood or material that looks like coffee grounds.  You are exposed to [place] measles and have never had them.  You are exposed to fifth disease or chickenpox.  You develop a severe headache.  You have shortness of breath.  You have any kind of trauma, such as from a fall or a car accident.   This information is not intended to replace advice given to you by your health care provider. Make sure you discuss any questions you have with your health care provider.   Document Released: 03/26/2001 Document Revised: 04/22/2014 Document Reviewed: 02/09/2013 Elsevier Interactive Patient Education [employer] Return in 1 week for [place] No sex

## 2015-05-16 NOTE — Progress Notes (Signed)
Subjective:     Patient ID: [name], female   DOB: 1980-12-22, 35 y.o.   MRN: [id number]  HPI [name] is a 35 year old white female in for UPT, had +HPT and has had pink spotting since Friday, no pain.  Review of Systems Patient denies any headaches, hearing loss, fatigue, blurred vision, shortness of breath, chest pain, abdominal pain, problems with bowel movements, urination, or intercourse. No joint pain or mood swings.See HPI for positives. Reviewed past medical,surgical, social and family history. Reviewed medications and allergies.     Objective:   Physical Exam BP 120/90 mmHg  Pulse 92  Ht 5' 2.5" (1.588 m)  Wt 237 lb 8 oz (107.729 kg)  BMI 42.72 kg/m2  LMP 04/05/2015 UPT +, about 5+6 weeks by LMP with EDD 01/10/16, Skin warm and dry. Lungs: clear to ausculation bilaterally. Cardiovascular: regular rate and rhythm.Abdomen soft and non tender, will get QHCG and progesterone level today and schedule [place] for next week.    Assessment:       Pregnant Vaginal spotting    Plan:        Take prenatal vitamin, has OTC Check QHCG and progesterone level Dating [place] in 1 week No sex  Review handout on first trimester

## 2015-05-17 ENCOUNTER — Ambulatory Visit (INDEPENDENT_AMBULATORY_CARE_PROVIDER_SITE_OTHER): Payer: 59

## 2015-05-17 ENCOUNTER — Telehealth: Payer: Self-pay | Admitting: Adult Health

## 2015-05-17 ENCOUNTER — Other Ambulatory Visit: Payer: Self-pay | Admitting: Adult Health

## 2015-05-17 DIAGNOSIS — O4691 Antepartum hemorrhage, unspecified, first trimester: Secondary | ICD-10-CM | POA: Diagnosis not present

## 2015-05-17 DIAGNOSIS — O3491 Maternal care for abnormality of pelvic organ, unspecified, first trimester: Secondary | ICD-10-CM | POA: Diagnosis not present

## 2015-05-17 DIAGNOSIS — O209 Hemorrhage in early pregnancy, unspecified: Secondary | ICD-10-CM

## 2015-05-17 DIAGNOSIS — O3680X0 Pregnancy with inconclusive fetal viability, not applicable or unspecified: Secondary | ICD-10-CM

## 2015-05-17 DIAGNOSIS — Z3A01 Less than 8 weeks gestation of pregnancy: Secondary | ICD-10-CM

## 2015-05-17 LAB — BETA HCG QUANT (REF LAB): hCG Quant: 2873 m[IU]/mL

## 2015-05-17 LAB — PROGESTERONE: Progesterone: 18.8 ng/mL

## 2015-05-17 NOTE — Telephone Encounter (Signed)
Left message that progesterone is good and that [facility] shows about 5-6 weeks, call with any questions or concerns keep [place] appt

## 2015-05-17 NOTE — Telephone Encounter (Signed)
Pt called stating she started bleeding red with clots, will get [place] and prefers in am ,appt made

## 2015-05-17 NOTE — Progress Notes (Signed)
[place] TA/TV 5+6wks GS w/ YS in the lower uterine segment,simple lt corpus luteal cyst 2.9 x 2.7 x 2.6cm,normal rt ov,no free fluid,EEC  9 mm,Costella spoke w/pt and has a f/u appt in one wk,Dr. Despina Hidden was notified

## 2015-05-23 ENCOUNTER — Other Ambulatory Visit: Payer: Self-pay | Admitting: Obstetrics & Gynecology

## 2015-05-23 ENCOUNTER — Ambulatory Visit (INDEPENDENT_AMBULATORY_CARE_PROVIDER_SITE_OTHER): Payer: 59

## 2015-05-23 DIAGNOSIS — O3680X0 Pregnancy with inconclusive fetal viability, not applicable or unspecified: Secondary | ICD-10-CM

## 2015-05-23 NOTE — Progress Notes (Signed)
US TV: no IUP seen,normal right ov,left ov simple corpus luteal cyst 2.8 x 2.7 x 2.7cm,EEC 8.6mm no color flow seen,Tessy spoke with pt.and she will return next wk for a QHCG and to recheck blood type.

## 2015-05-25 ENCOUNTER — Other Ambulatory Visit: Payer: 59

## 2015-06-01 ENCOUNTER — Telehealth: Payer: Self-pay | Admitting: Adult Health

## 2015-06-01 DIAGNOSIS — O039 Complete or unspecified spontaneous abortion without complication: Secondary | ICD-10-CM

## 2015-06-01 NOTE — Telephone Encounter (Signed)
Will get QHCG in am sp miscarriage

## 2015-06-02 DIAGNOSIS — O039 Complete or unspecified spontaneous abortion without complication: Secondary | ICD-10-CM | POA: Diagnosis not present

## 2015-06-03 LAB — BETA HCG QUANT (REF LAB): hCG Quant: 1 m[IU]/mL

## 2015-06-06 ENCOUNTER — Telehealth: Payer: Self-pay | Admitting: Adult Health

## 2015-06-06 NOTE — Telephone Encounter (Signed)
left message QHCG<1

## 2015-08-18 ENCOUNTER — Telehealth: Payer: Self-pay

## 2015-08-18 ENCOUNTER — Other Ambulatory Visit: Payer: Self-pay

## 2015-08-18 MED ORDER — OSELTAMIVIR PHOSPHATE 75 MG PO CAPS: 75.0000 mg | ORAL_CAPSULE | Freq: Two times a day (BID) | ORAL | Status: DC

## 2015-08-18 NOTE — Telephone Encounter (Signed)
Entered , pls send to the pharmacy of her choice 

## 2015-08-18 NOTE — Telephone Encounter (Signed)
Med sent and patient aware 

## 2015-08-18 NOTE — Telephone Encounter (Signed)
Daughter tested positive for flu b. 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 Wants tamiflu called in. Please advise

## 2015-09-06 ENCOUNTER — Ambulatory Visit (INDEPENDENT_AMBULATORY_CARE_PROVIDER_SITE_OTHER): Payer: 59 | Admitting: Family Medicine

## 2015-09-06 ENCOUNTER — Encounter: Payer: Self-pay | Admitting: Family Medicine

## 2015-09-06 VITALS — BP 114/80 | HR 79 | Resp 16 | Ht 63.0 in | Wt 217.0 lb

## 2015-09-06 DIAGNOSIS — R7302 Impaired glucose tolerance (oral): Secondary | ICD-10-CM

## 2015-09-06 DIAGNOSIS — J301 Allergic rhinitis due to pollen: Secondary | ICD-10-CM

## 2015-09-06 DIAGNOSIS — E559 Vitamin D deficiency, unspecified: Secondary | ICD-10-CM

## 2015-09-06 DIAGNOSIS — J209 Acute bronchitis, unspecified: Secondary | ICD-10-CM | POA: Insufficient documentation

## 2015-09-06 DIAGNOSIS — Z114 Encounter for screening for human immunodeficiency virus [HIV]: Secondary | ICD-10-CM | POA: Diagnosis not present

## 2015-09-06 DIAGNOSIS — E785 Hyperlipidemia, unspecified: Secondary | ICD-10-CM

## 2015-09-06 DIAGNOSIS — Z1329 Encounter for screening for other suspected endocrine disorder: Secondary | ICD-10-CM | POA: Diagnosis not present

## 2015-09-06 DIAGNOSIS — J0110 Acute frontal sinusitis, unspecified: Secondary | ICD-10-CM

## 2015-09-06 DIAGNOSIS — J019 Acute sinusitis, unspecified: Secondary | ICD-10-CM | POA: Insufficient documentation

## 2015-09-06 DIAGNOSIS — R002 Palpitations: Secondary | ICD-10-CM

## 2015-09-06 DIAGNOSIS — Z1159 Encounter for screening for other viral diseases: Secondary | ICD-10-CM | POA: Diagnosis not present

## 2015-09-06 LAB — TSH: TSH: 2.88 mIU/L

## 2015-09-06 LAB — HEMOGLOBIN A1C
Hgb A1c MFr Bld: 5.4 % (ref <5.7)
Mean Plasma Glucose: 108 mg/dL

## 2015-09-06 LAB — CBC
HCT: 42.2 % (ref 35.0–45.0)
Hemoglobin: 13.8 g/dL (ref 11.7–15.5)
MCH: 27.9 pg (ref 27.0–33.0)
MCHC: 32.7 g/dL (ref 32.0–36.0)
MCV: 85.3 fL (ref 80.0–100.0)
MPV: 10.3 fL (ref 7.5–12.5)
Platelets: 422 10*3/uL — ABNORMAL HIGH (ref 140–400)
RBC: 4.95 MIL/uL (ref 3.80–5.10)
RDW: 14.8 % (ref 11.0–15.0)
WBC: 13.4 10*3/uL — ABNORMAL HIGH (ref 3.8–10.8)

## 2015-09-06 LAB — BASIC METABOLIC PANEL
BUN: 18 mg/dL (ref 7–25)
CO2: 20 mmol/L (ref 20–31)
Calcium: 9.3 mg/dL (ref 8.6–10.2)
Chloride: 103 mmol/L (ref 98–110)
Creat: 0.71 mg/dL (ref 0.50–1.10)
Glucose, Bld: 90 mg/dL (ref 65–99)
Potassium: 3.9 mmol/L (ref 3.5–5.3)
Sodium: 139 mmol/L (ref 135–146)

## 2015-09-06 LAB — LIPID PANEL
Cholesterol: 183 mg/dL (ref 125–200)
HDL: 40 mg/dL — ABNORMAL LOW (ref >=46)
LDL Cholesterol: 108 mg/dL (ref <130)
Total CHOL/HDL Ratio: 4.6 Ratio (ref <=5.0)
Triglycerides: 177 mg/dL — ABNORMAL HIGH (ref <150)
VLDL: 35 mg/dL — ABNORMAL HIGH (ref <30)

## 2015-09-06 LAB — HIV ANTIBODY (ROUTINE TESTING W REFLEX): HIV 1&2 Ab, 4th Generation: NONREACTIVE

## 2015-09-06 MED ORDER — FLUCONAZOLE 150 MG PO TABS: ORAL_TABLET | ORAL | Status: DC

## 2015-09-06 MED ORDER — BENZONATATE 100 MG PO CAPS: 100.0000 mg | ORAL_CAPSULE | Freq: Two times a day (BID) | ORAL | Status: DC | PRN

## 2015-09-06 MED ORDER — MONTELUKAST SODIUM 10 MG PO TABS: 10.0000 mg | ORAL_TABLET | Freq: Every day | ORAL | Status: DC

## 2015-09-06 MED ORDER — PROMETHAZINE-DM 6.25-15 MG/5ML PO SYRP: ORAL_SOLUTION | ORAL | Status: DC

## 2015-09-06 MED ORDER — PREDNISONE 5 MG (21) PO TBPK: 5.0000 mg | ORAL_TABLET | ORAL | Status: DC

## 2015-09-06 MED ORDER — PENICILLIN V POTASSIUM 500 MG PO TABS: 500.0000 mg | ORAL_TABLET | Freq: Three times a day (TID) | ORAL | Status: DC

## 2015-09-06 MED ORDER — METHYLPREDNISOLONE ACETATE 80 MG/ML IJ SUSP
80.0000 mg | Freq: Once | INTRAMUSCULAR | Status: AC
  Administered 2015-09-06: 80 mg via INTRAMUSCULAR

## 2015-09-06 NOTE — Progress Notes (Signed)
   Subjective:    Patient ID: [name], female    DOB: 01/27/1981, 35 y.o.   MRN: [id number]  HPI 1 week h/o worsening head and chest congestion, associated with fever and chills intermittently. Nasal drainage has thickened , and is yellowish green, and at times bloody. Sputum is thick and yellow. C/o  sore throat.last weekend Prophylactic treatment for flu 2 weeks ago . No improvement with OTC medication. Significant weight loss since last visit, unfortunately she lost a pregnancy earlier this year. States she will be happy if she has a 3rd child, oldest is in college    Review of Systems See HPI Denies chest pains, palpitations and leg swelling Denies abdominal pain, nausea, vomiting,diarrhea or constipation.   Denies dysuria, frequency, hesitancy or incontinence. Denies joint pain, swelling and limitation in mobility. Denies headaches, seizures, numbness, or tingling. Denies depression, anxiety or insomnia. Denies skin break down or rash.          Objective:   Physical Exam  BP 114/80 mmHg  Pulse 79  Resp 16  Ht 5\' 3"  (1.6 m)  Wt 217 lb (98.431 kg)  BMI 38.45 kg/m2  SpO2 98%  LMP 04/05/2015 Patient alert and oriented and in no cardiopulmonary distress.  HEENT: No facial asymmetry, EOMI,   oropharynx pink and moist.  Neck supple no JVD, no mass.TM clear. Bilateral anterior cervical adenitis. Positive frontal sinus tenderness  Chest: adequate iar entry, bilateral crackles, no wheezes.  CVS: S1, S2 no murmurs, no S3.Regular rate.  ABD: Soft non tender.   Ext: No edema  MS: Adequate ROM spine, shoulders, hips and knees.  Skin: Intact, no ulcerations or rash noted.  Psych: Good eye contact, normal affect. Memory intact not anxious or depressed appearing.  CNS: CN 2-12 intact, power,  normal throughout.no focal deficits noted.      Assessment & Plan:  ALLERGIC RHINITIS, SEASONAL Depo medrol 80 mg iM, uncontrolled and commitment to daily  singulair  Acute sinusitis Penicillin x 10 days, needs to commit to daily allergy medication also  Acute bronchitis Decongestant, cough suppressant and antibiotic course prescribed  Morbid obesity Improved. Pt applauded on succesful weight loss through lifestyle change, and encouraged to continue same. Weight loss goal set for the next several months.   Palpitations Improved, uses beta blocker only as needed

## 2015-09-06 NOTE — Assessment & Plan Note (Addendum)
Depo medrol 80 mg iM, uncontrolled and commitment to daily singulair

## 2015-09-06 NOTE — Patient Instructions (Addendum)
F/u in 4 month, call if you need me before  You are treated for acute sinusitis and bronchitis and uncontrolled allergies, all meds to local pharmacy except singulair  Depo medrol IM in office   Labs today fasting  Congrats on weight loss, keep it up!!   Please work on good  health habits so that your health will improve. 1. Commitment to daily physical activity for 30 to 60  minutes, if you are able to do this.  2. Commitment to wise food choices. Aim for half of your  food intake to be vegetable and fruit, one quarter starchy foods, and one quarter protein. Try to eat on a regular schedule  3 meals per day, snacking between meals should be limited to vegetables or fruits or small portions of nuts. 64 ounces of water per day is generally recommended, unless you have specific health conditions, like heart failure or kidney failure where you will need to limit fluid intake.  3. Commitment to sufficient and a  good quality of physical and mental rest daily, generally between 6 to 8 hours per day.  WITH PERSISTANCE AND PERSEVERANCE, THE IMPOSSIBLE , BECOMES THE NORM!  Thank you  for choosing [facility]. We consider it a privelige to serve you.  Delivering excellent health care in a caring and  compassionate way is our goal.  Partnering with you,  so that together we can achieve this goal is our strategy.

## 2015-09-07 ENCOUNTER — Encounter: Payer: Self-pay | Admitting: Family Medicine

## 2015-09-07 LAB — VITAMIN D 25 HYDROXY (VIT D DEFICIENCY, FRACTURES): Vit D, 25-Hydroxy: 22 ng/mL — ABNORMAL LOW (ref 30–100)

## 2015-09-10 NOTE — Assessment & Plan Note (Signed)
Decongestant, cough suppressant and antibiotic course prescribed

## 2015-09-10 NOTE — Assessment & Plan Note (Signed)
Penicillin x 10 days, needs to commit to daily allergy medication also

## 2015-09-10 NOTE — Assessment & Plan Note (Signed)
Improved, uses beta blocker only as needed

## 2015-09-10 NOTE — Assessment & Plan Note (Signed)
Improved. Pt applauded on succesful weight loss through lifestyle change, and encouraged to continue same. Weight loss goal set for the next several months.  

## 2015-09-20 MED FILL — MONTELUKAST SOD 10 MG TAB: 10 | 90 days supply | Qty: 90 | Fill #0

## 2016-01-03 ENCOUNTER — Telehealth: Payer: Self-pay | Admitting: Family Medicine

## 2016-01-03 NOTE — Telephone Encounter (Signed)
LM to reschedule - Dr. Simpson PAL °

## 2016-01-11 ENCOUNTER — Ambulatory Visit: Payer: 59 | Admitting: Family Medicine

## 2016-01-31 ENCOUNTER — Encounter: Payer: Self-pay | Admitting: Family Medicine

## 2016-01-31 ENCOUNTER — Ambulatory Visit (HOSPITAL_COMMUNITY)
Admission: RE | Admit: 2016-01-31 | Discharge: 2016-01-31 | Disposition: A | Discharge status: Home / Self Care | Payer: 59 | Source: Ambulatory Visit | Attending: Family Medicine | Admitting: Family Medicine

## 2016-01-31 ENCOUNTER — Ambulatory Visit (INDEPENDENT_AMBULATORY_CARE_PROVIDER_SITE_OTHER): Payer: 59 | Admitting: Family Medicine

## 2016-01-31 VITALS — BP 108/72 | HR 71 | Ht 63.0 in | Wt 215.0 lb

## 2016-01-31 DIAGNOSIS — M25561 Pain in right knee: Secondary | ICD-10-CM

## 2016-01-31 DIAGNOSIS — M25562 Pain in left knee: Secondary | ICD-10-CM

## 2016-01-31 DIAGNOSIS — R002 Palpitations: Secondary | ICD-10-CM | POA: Diagnosis not present

## 2016-01-31 DIAGNOSIS — J309 Allergic rhinitis, unspecified: Secondary | ICD-10-CM

## 2016-01-31 DIAGNOSIS — G43009 Migraine without aura, not intractable, without status migrainosus: Secondary | ICD-10-CM | POA: Diagnosis not present

## 2016-01-31 DIAGNOSIS — G8929 Other chronic pain: Secondary | ICD-10-CM | POA: Diagnosis not present

## 2016-01-31 DIAGNOSIS — G4709 Other insomnia: Secondary | ICD-10-CM

## 2016-01-31 DIAGNOSIS — R937 Abnormal findings on diagnostic imaging of other parts of musculoskeletal system: Secondary | ICD-10-CM | POA: Diagnosis not present

## 2016-01-31 HISTORY — DX: Pain in right knee: M25.561

## 2016-01-31 HISTORY — DX: Pain in left knee: M25.562

## 2016-01-31 MED ORDER — TEMAZEPAM 7.5 MG PO CAPS: 7.5000 mg | ORAL_CAPSULE | Freq: Every evening | ORAL | 3 refills | Status: DC | PRN

## 2016-01-31 MED ORDER — BUTALBITAL-APAP-CAFFEINE 50-325-40 MG PO TABS: ORAL_TABLET | ORAL | 1 refills | Status: DC

## 2016-01-31 MED FILL — BUTALBITAL/APAP/CAFFEINE TB: 50-325-40 | 15 days supply | Qty: 30 | Fill #0

## 2016-01-31 MED FILL — MONTELUKAST SOD 10 MG TAB: 10 | 90 days supply | Qty: 90 | Fill #1

## 2016-01-31 MED FILL — TEMAZEPAM 7.5 MG CAPSULE: 7.5 | 30 days supply | Qty: 30 | Fill #0

## 2016-01-31 NOTE — Assessment & Plan Note (Signed)
Will use as needed only,no episodes in 6 months, triggers known are caffeine and lack of sleep

## 2016-01-31 NOTE — Assessment & Plan Note (Signed)
unchanged Patient re-educated about  the importance of commitment to a  minimum of 150 minutes of exercise per week.  The importance of healthy food choices with portion control discussed. Encouraged to start a food diary, count calories and to consider  joining a support group. Sample diet sheets offered. Goals set by the patient for the next several months.   Weight /BMI 01/31/2016 09/06/2015 05/16/2015  WEIGHT 215 lb 217 lb 237 lb 8 oz  HEIGHT 5\' 3"  5\' 3"  5' 2.5"  BMI 38.09 kg/m2 38.45 kg/m2 42.72 kg/m2

## 2016-01-31 NOTE — Assessment & Plan Note (Addendum)
Sleeps for 4 hrs the 3 days she works, will practice good sleep hygiene and start restoril 7.5 mg on these days only

## 2016-01-31 NOTE — Progress Notes (Signed)
   Rolan BuccoJennifer M Tiedeman     MRN: [id number]      DOB: 17-Jan-1981   HPI [name] is here for follow up and re-evaluation of chronic medical conditions, medication management and review of any available recent lab and radiology data.  Preventive health is updated, specifically  Cancer screening and Immunization.   Intends to resume food diary to assist with continued weight loss The PT denies any adverse reactions to current medications since the last visit. Discontinued beta blocker, and has had no paliptations for over 6 ,months will keep for as needed use  C/o bilateral knee pain, and crepitus, right worse than left with difficulty climbing steps, worse in past 4 month  ROS Denies recent fever or chills. Denies sinus pressure, nasal congestion, ear pain or sore throat. Denies chest congestion, productive cough or wheezing. Denies chest pains, palpitations and leg swelling Denies abdominal pain, nausea, vomiting,diarrhea or constipation.   Denies dysuria, frequency, hesitancy or incontinence.  Migraines approx once in 4 to 6 months, responds to fioricet, triggered by sleep deprivation Denies depression, anxiety or insomnia. Denies skin break down or rash.   PE  BP 108/72   Pulse 71   Ht 5\' 3"  (1.6 m)   Wt 215 lb (97.5 kg)   LMP 01/10/2016   SpO2 99%   BMI 38.09 kg/m   Patient alert and oriented and in no cardiopulmonary distress.  HEENT: No facial asymmetry, EOMI,   oropharynx pink and moist.  Neck supple no JVD, no mass.  Chest: Clear to auscultation bilaterally.  CVS: S1, S2 no murmurs, no S3.Regular rate.  ABD: Soft non tender.   Ext: No edema  MS: Adequate ROM spine, shoulders, hips and knees.crepitusin both knees, no deformity or swelling, mild tenderness  Skin: Intact, no ulcerations or rash noted.  Psych: Good eye contact, normal affect. Memory intact not anxious or depressed appearing.  CNS: CN 2-12 intact, power,  normal throughout.no focal deficits  noted.   Assessment & Plan  Palpitations Will use as needed only,no episodes in 6 months, triggers known are caffeine and lack of sleep  Insomnia Sleeps for 4 hrs the 3 days she works, will practice good sleep hygiene and start restoril 7.5 mg on these days only  Morbid obesity unchanged Patient re-educated about  the importance of commitment to a  minimum of 150 minutes of exercise per week.  The importance of healthy food choices with portion control discussed. Encouraged to start a food diary, count calories and to consider  joining a support group. Sample diet sheets offered. Goals set by the patient for the next several months.   Weight /BMI 01/31/2016 09/06/2015 05/16/2015  WEIGHT 215 lb 217 lb 237 lb 8 oz  HEIGHT 5\' 3"  5\' 3"  5' 2.5"  BMI 38.09 kg/m2 38.45 kg/m2 42.72 kg/m2      Migraine headache Markedly reduced frequency, none in past 6 months, sleep deprivation the main trigger responds to fioricet  Knee pain, bilateral Increased in 4 months, difficulty climbing stairs, no real pain or instability, weight loss and thigh strengthening exercise advised, xrays of knees on day of visit  Allergic rhinitis Controlled, no change in medication , as needed medication only.

## 2016-01-31 NOTE — Patient Instructions (Addendum)
F/u in 4.5 months, call if you need me before  X rays of both knees today  Please read book recommended and try to change eating for overall health   CONGRATS on 30 pound weight loss in past 1 year, repeat performance in next 12 months   New  Is restoril for use when working, mini dose  Refills will be sent       Knee Pain Knee pain is a common problem. It can have many causes. The pain often goes away by following your doctor's home care instructions. Treatment for ongoing pain will depend on the cause of your pain. If your knee pain continues, more tests may be needed to diagnose your condition. Tests may include X-rays or other imaging studies of your knee. HOME CARE  Take medicines only as told by your doctor.  Rest your knee and keep it raised (elevated) while you are resting.  Do not do things that cause pain or make your pain worse.  Avoid activities where both feet leave the ground at the same time, such as running, jumping rope, or doing jumping jacks.  Apply ice to the knee area:  Put ice in a plastic bag.  Place a towel between your skin and the bag.  Leave the ice on for 20 minutes, 2-3 times a day.  Ask your doctor if you should wear an elastic knee support.  Sleep with a pillow under your knee.  Lose weight if you are overweight. Being overweight can make your knee hurt more.  Do not use any tobacco products, including cigarettes, chewing tobacco, or electronic cigarettes. If you need help quitting, ask your doctor. Smoking may slow the healing of any bone and joint problems that you may have. GET HELP IF:  Your knee pain does not stop, it changes, or it gets worse.  You have a fever along with knee pain.  Your knee gives out or locks up.  Your knee becomes more swollen. GET HELP RIGHT AWAY IF:   Your knee feels hot to the touch.  You have chest pain or trouble breathing.   This information is not intended to replace advice given to you by  your health care provider. Make sure you discuss any questions you have with your health care provider.   Document Released: 06/28/2008 Document Revised: 04/22/2014 Document Reviewed: 06/02/2013 Elsevier Interactive Patient Education [employer]

## 2016-02-01 ENCOUNTER — Encounter: Payer: Self-pay | Admitting: Family Medicine

## 2016-02-02 ENCOUNTER — Other Ambulatory Visit: Payer: Self-pay

## 2016-02-02 DIAGNOSIS — R002 Palpitations: Secondary | ICD-10-CM

## 2016-02-02 MED ORDER — METOPROLOL TARTRATE 50 MG PO TABS: ORAL_TABLET | ORAL | 5 refills | Status: DC

## 2016-02-02 MED ORDER — EPINEPHRINE 0.3 MG/0.3ML IJ SOAJ: 0.3000 mg | Freq: Once | INTRAMUSCULAR | 2 refills | Status: AC

## 2016-02-03 NOTE — Assessment & Plan Note (Signed)
Markedly reduced frequency, none in past 6 months, sleep deprivation the main trigger responds to fioricet

## 2016-02-03 NOTE — Assessment & Plan Note (Addendum)
Controlled, no change in medication , as needed medication only.

## 2016-02-03 NOTE — Assessment & Plan Note (Signed)
Increased in 4 months, difficulty climbing stairs, no real pain or instability, weight loss and thigh strengthening exercise advised, xrays of knees on day of visit

## 2016-02-15 MED FILL — METOPROLOL TARTRATE 50 MG T: 50 | 30 days supply | Qty: 60 | Fill #0

## 2016-02-15 MED FILL — EPINEPHRINE 0.3 MG AUTO-INJ: 0.3 | 30 days supply | Qty: 2 | Fill #0

## 2016-02-27 DIAGNOSIS — B399 Histoplasmosis, unspecified: Secondary | ICD-10-CM | POA: Diagnosis not present

## 2016-02-27 DIAGNOSIS — H5213 Myopia, bilateral: Secondary | ICD-10-CM | POA: Diagnosis not present

## 2016-02-27 DIAGNOSIS — H52221 Regular astigmatism, right eye: Secondary | ICD-10-CM | POA: Diagnosis not present

## 2016-03-12 ENCOUNTER — Ambulatory Visit (INDEPENDENT_AMBULATORY_CARE_PROVIDER_SITE_OTHER): Payer: 59 | Admitting: Adult Health

## 2016-03-12 ENCOUNTER — Encounter: Payer: Self-pay | Admitting: Adult Health

## 2016-03-12 VITALS — BP 116/80 | HR 72 | Ht 62.0 in | Wt 222.0 lb

## 2016-03-12 DIAGNOSIS — N750 Cyst of Bartholin's gland: Secondary | ICD-10-CM

## 2016-03-12 DIAGNOSIS — Z01419 Encounter for gynecological examination (general) (routine) without abnormal findings: Secondary | ICD-10-CM

## 2016-03-12 DIAGNOSIS — Z01411 Encounter for gynecological examination (general) (routine) with abnormal findings: Secondary | ICD-10-CM

## 2016-03-12 MED ORDER — SULFAMETHOXAZOLE-TRIMETHOPRIM 800-160 MG PO TABS: 1.0000 | ORAL_TABLET | Freq: Two times a day (BID) | ORAL | 0 refills | Status: DC

## 2016-03-12 NOTE — Patient Instructions (Signed)
Physical in 1 year Pap in 2019 Finish antibiotics

## 2016-03-12 NOTE — Progress Notes (Signed)
Patient ID: [name], female   DOB: 1980-05-05, 35 y.o.   MRN: [id number] History of Present Illness:  [name] is a 35 year old white female in for well woman gyn exam, had normal pap with negative HPV 11/04/22/14.She is working nights in N[facility] at [facility].  PCP is Dr Lodema HongSimpson.  Current Medications, Allergies, Past Medical History, Past Surgical History, Family History and Social History were reviewed in [facility] electronic medical record.     Review of Systems: Patient denies any headaches, hearing loss, fatigue, blurred vision, shortness of breath, chest pain, abdominal pain, problems with bowel movements, urination, or intercourse. No joint pain or mood swings.She says has bartholin cyst on left.    Physical Exam:BP 116/80 (BP Location: Left Arm, Patient Position: Sitting, Cuff Size: Large)   Pulse 72   Ht 5\' 2"  (1.575 m)   Wt 222 lb (100.7 kg)   LMP 03/02/2016 (Exact Date)   Breastfeeding? No   BMI 40.60 kg/m  General:  Well developed, well nourished, no acute distress Skin:  Warm and dry Neck:  Midline trachea, normal thyroid, good ROM, no lymphadenopathy Lungs; Clear to auscultation bilaterally Breast:  No dominant palpable mass, retraction, or nipple discharge Cardiovascular: Regular rate and rhythm Abdomen:  Soft, non tender, no hepatosplenomegaly Pelvic:  External genitalia is normal in appearance, no lesions.  The vagina is normal in appearance,except has bartholin cyst on left, mildly tender. Urethra has no lesions or masses. The cervix is smooth.  Uterus is felt to be normal size, shape, and contour.  No adnexal masses or tenderness noted.Bladder is non tender, no masses felt.Dr Despina HiddenEure for co exam, will give antibiotics for now and if does not resolve call for I&D. Extremities/musculoskeletal:  No swelling or varicosities noted, no clubbing or cyanosis Psych:  No mood changes, alert and cooperative,seems happy PHQ 2 score 0.  Impression: 1. Well woman exam with  routine gynecological exam   2. Bartholin cyst       Plan: Meds ordered this encounter  Medications  . sulfamethoxazole-trimethoprim (BACTRIM DS,SEPTRA DS) 800-160 MG tablet    Sig: Take 1 tablet by mouth 2 (two) times daily.    Dispense:  28 tablet    Refill:  0    Order Specific Question:   Supervising Provider    Answer:   Lazaro ArmsEURE, LUTHER H [[id number]]  Physical in 1 year, pap in 2019 She declines pain meds, will use motrin or tylenol Call if needs cyst I&D

## 2016-05-11 ENCOUNTER — Telehealth: Payer: 59 | Admitting: Nurse Practitioner

## 2016-05-11 DIAGNOSIS — N3000 Acute cystitis without hematuria: Secondary | ICD-10-CM | POA: Diagnosis not present

## 2016-05-11 MED ORDER — CEPHALEXIN 500 MG PO CAPS: 500.0000 mg | ORAL_CAPSULE | Freq: Two times a day (BID) | ORAL | 0 refills | Status: DC

## 2016-05-11 NOTE — Progress Notes (Signed)

## 2016-05-14 ENCOUNTER — Encounter: Payer: Self-pay | Admitting: Family Medicine

## 2016-05-15 ENCOUNTER — Telehealth: Payer: 59 | Admitting: Family

## 2016-05-15 DIAGNOSIS — B373 Candidiasis of vulva and vagina: Secondary | ICD-10-CM | POA: Diagnosis not present

## 2016-05-15 DIAGNOSIS — B3731 Acute candidiasis of vulva and vagina: Secondary | ICD-10-CM

## 2016-05-15 MED ORDER — FLUCONAZOLE 150 MG PO TABS: 150.0000 mg | ORAL_TABLET | ORAL | 0 refills | Status: DC | PRN

## 2016-05-15 NOTE — Progress Notes (Signed)

## 2016-05-23 ENCOUNTER — Other Ambulatory Visit: Payer: Self-pay | Admitting: Family Medicine

## 2016-05-23 ENCOUNTER — Encounter: Payer: Self-pay | Admitting: Family Medicine

## 2016-05-23 MED ORDER — OSELTAMIVIR PHOSPHATE 75 MG PO CAPS: 75.0000 mg | ORAL_CAPSULE | Freq: Two times a day (BID) | ORAL | 0 refills | Status: DC

## 2016-06-19 ENCOUNTER — Ambulatory Visit: Payer: 59 | Admitting: Family Medicine

## 2016-07-09 ENCOUNTER — Other Ambulatory Visit: Payer: Self-pay | Admitting: Family Medicine

## 2016-07-09 DIAGNOSIS — J301 Allergic rhinitis due to pollen: Secondary | ICD-10-CM

## 2016-07-09 MED FILL — TEMAZEPAM 7.5 MG CAPSULE: 7.5 | 30 days supply | Qty: 30 | Fill #1

## 2016-07-09 MED FILL — MONTELUKAST SOD 10 MG TAB: 10 | 90 days supply | Qty: 90 | Fill #0

## 2016-07-10 ENCOUNTER — Telehealth: Payer: 59 | Admitting: Family

## 2016-07-10 DIAGNOSIS — J029 Acute pharyngitis, unspecified: Secondary | ICD-10-CM

## 2016-07-10 MED ORDER — AMOXICILLIN-POT CLAVULANATE 875-125 MG PO TABS: 1.0000 | ORAL_TABLET | Freq: Two times a day (BID) | ORAL | 0 refills | Status: DC

## 2016-07-10 MED FILL — AMOX-CLAV 875-125 MG TABLET: 875-125 | 7 days supply | Qty: 14 | Fill #0

## 2016-07-10 NOTE — Progress Notes (Signed)

## 2016-09-10 ENCOUNTER — Encounter: Payer: Self-pay | Admitting: Adult Health

## 2016-09-12 ENCOUNTER — Other Ambulatory Visit: Payer: Self-pay | Admitting: Women's Health

## 2016-09-12 DIAGNOSIS — Z3201 Encounter for pregnancy test, result positive: Secondary | ICD-10-CM

## 2016-09-12 DIAGNOSIS — N96 Recurrent pregnancy loss: Secondary | ICD-10-CM

## 2016-09-13 DIAGNOSIS — Z3201 Encounter for pregnancy test, result positive: Secondary | ICD-10-CM | POA: Diagnosis not present

## 2016-09-13 DIAGNOSIS — N96 Recurrent pregnancy loss: Secondary | ICD-10-CM | POA: Diagnosis not present

## 2016-09-14 LAB — BETA HCG QUANT (REF LAB): hCG Quant: 11812 m[IU]/mL

## 2016-09-14 LAB — PROGESTERONE: Progesterone: 13.2 ng/mL

## 2016-09-16 ENCOUNTER — Other Ambulatory Visit: Payer: Self-pay | Admitting: Women's Health

## 2016-09-16 MED ORDER — PROGESTERONE MICRONIZED 200 MG PO CAPS: 200.0000 mg | ORAL_CAPSULE | Freq: Every day | ORAL | 2 refills | Status: DC

## 2016-09-16 MED FILL — PROGESTERONE 200 MG CAPSULE: 200 | 30 days supply | Qty: 30 | Fill #0

## 2016-09-16 NOTE — Progress Notes (Signed)
Pt sent message via MyChart last week reporting 3 +HPT, requesting progesterone and bhcg d/t h/o miscarriages. Progesterone slightly low @ 13.2, BHCG 11,812. Rx prometrium 200mg  pv q hs through 14wks. Sent message to call and get dating u/s scheduled.  Cheral MarkerKimberly R. Freeland Pracht, CNM, [facility] 09/16/2016 5:02 PM

## 2016-09-24 ENCOUNTER — Other Ambulatory Visit: Payer: Self-pay | Admitting: Obstetrics & Gynecology

## 2016-09-24 DIAGNOSIS — O3680X0 Pregnancy with inconclusive fetal viability, not applicable or unspecified: Secondary | ICD-10-CM

## 2016-09-25 ENCOUNTER — Ambulatory Visit (INDEPENDENT_AMBULATORY_CARE_PROVIDER_SITE_OTHER): Payer: 59

## 2016-09-25 DIAGNOSIS — O3680X0 Pregnancy with inconclusive fetal viability, not applicable or unspecified: Secondary | ICD-10-CM | POA: Diagnosis not present

## 2016-09-25 NOTE — Progress Notes (Signed)
US 7+3 wks,single IUP w/ys,pos fht 146 bpm,normal ovaries bilat,crl 12.59 mm,EDD 05/11/2017

## 2016-10-11 ENCOUNTER — Ambulatory Visit (INDEPENDENT_AMBULATORY_CARE_PROVIDER_SITE_OTHER): Payer: 59 | Admitting: Women's Health

## 2016-10-11 ENCOUNTER — Ambulatory Visit: Payer: 59 | Admitting: *Deleted

## 2016-10-11 ENCOUNTER — Encounter: Payer: Self-pay | Admitting: Women's Health

## 2016-10-11 VITALS — BP 118/80 | HR 108 | Wt 225.0 lb

## 2016-10-11 DIAGNOSIS — Z3A09 9 weeks gestation of pregnancy: Secondary | ICD-10-CM

## 2016-10-11 DIAGNOSIS — Z331 Pregnant state, incidental: Secondary | ICD-10-CM

## 2016-10-11 DIAGNOSIS — Z8759 Personal history of other complications of pregnancy, childbirth and the puerperium: Secondary | ICD-10-CM

## 2016-10-11 DIAGNOSIS — Z3481 Encounter for supervision of other normal pregnancy, first trimester: Secondary | ICD-10-CM

## 2016-10-11 DIAGNOSIS — O09299 Supervision of pregnancy with other poor reproductive or obstetric history, unspecified trimester: Secondary | ICD-10-CM | POA: Insufficient documentation

## 2016-10-11 DIAGNOSIS — Z1389 Encounter for screening for other disorder: Secondary | ICD-10-CM

## 2016-10-11 DIAGNOSIS — O09291 Supervision of pregnancy with other poor reproductive or obstetric history, first trimester: Secondary | ICD-10-CM

## 2016-10-11 DIAGNOSIS — O0990 Supervision of high risk pregnancy, unspecified, unspecified trimester: Secondary | ICD-10-CM | POA: Insufficient documentation

## 2016-10-11 HISTORY — DX: Supervision of pregnancy with other poor reproductive or obstetric history, unspecified trimester: O09.299

## 2016-10-11 HISTORY — DX: Personal history of other complications of pregnancy, childbirth and the puerperium: Z87.59

## 2016-10-11 LAB — POCT URINALYSIS DIPSTICK
Blood, UA: NEGATIVE
Glucose, UA: NEGATIVE
Ketones, UA: NEGATIVE
Leukocytes, UA: NEGATIVE
Nitrite, UA: NEGATIVE
Protein, UA: NEGATIVE

## 2016-10-11 NOTE — Progress Notes (Signed)
Subjective:  [name] is a 36 y.o. [id number] Caucasian female at [redacted]w[redacted]d by LMP c/w 7wk u/s, being seen today for her first obstetrical visit.  Her obstetrical history is significant for term SVB x 2, pre-e w/ both, mild shoulder dystocia w/ 2nd- baby 9lb2oz; progesterone 13.2 this pregnancy, taking prometrium q hs until 14wks; h/o sab x 3. H/o palpitations- has lopressor prn which helps. Pregnancy history fully reviewed.  Patient reports no complaints. Denies vb, cramping, uti s/s, abnormal/malodorous vag d/c, or vulvovaginal itching/irritation.  BP (!) 120/100   Pulse (!) 108   Wt 225 lb (102.1 kg)   LMP 08/04/2016 (Exact Date)   BMI 41.15 kg/m  BP recheck 118/80 Denies any h/o HTN outside of pregnancy, usually low  HISTORY: OB History  Gravida Para Term Preterm AB Living  6 2 2   3 2   SAB TAB Ectopic Multiple Live Births  3       2    # Outcome Date GA Lbr Len/2nd Weight Sex Delivery Anes PTL Lv  6 Current           5 SAB 2017          4 SAB 2010          3 Term 02/20/05 [redacted]w[redacted]d  9 lb 2 oz (4.139 kg) F Vag-Spont EPI N LIV     Complications: Shoulder Dystocia,PIH (pregnancy induced hypertension)  2 SAB 2005          1 Term 03/16/96 [redacted]w[redacted]d  8 lb 2 oz (3.685 kg) F Vag-Vacuum None N LIV     Complications: PIH (pregnancy induced hypertension)     Past Medical History:  Diagnosis Date  . Allergy   . Arthritis    BACK  . Back pain 09/05/2010  . Headache(784.0)    MIGRAINES  -- LAST ONE  09/2011  . Hyperlipidemia 07/23/2012  . Obesity (BMI 30-39.9) 10/09/2009  . Palpitations   . Pregnancy induced hypertension   . Pregnant 05/16/2015  . Vaginal spotting 05/16/2015   Past Surgical History:  Procedure Laterality Date  . CHOLECYSTECTOMY N/A 08/24/2012   Procedure: LAPAROSCOPIC CHOLECYSTECTOMY;  Surgeon: Dalia Heading, MD;  Location: [facility];  Service: General;  Laterality: N/A;  . DILATION AND CURETTAGE OF UTERUS    . LUMBAR LAMINECTOMY/DECOMPRESSION MICRODISCECTOMY  12/03/2011   Procedure: LUMBAR LAMINECTOMY/DECOMPRESSION MICRODISCECTOMY 1 LEVEL;  Surgeon: Maeola Harman, MD;  Location: [facility];  Service: Neurosurgery;  Laterality: Left;  LEFT Lumbar four-five microdiskectomy  . TONSILLECTOMY    . WISDOM TOOTH EXTRACTION     Family History  Problem Relation Age of Onset  . Arthritis Mother   . Diabetes Mother   . Diabetes Maternal Grandmother   . Heart disease Maternal Grandmother   . Diabetes Paternal Grandmother   . Heart disease Paternal Grandmother   . Heart attack Paternal Grandmother   . Heart disease Paternal Grandfather   . Diabetes Paternal Grandfather     Exam   System:     General: Well developed & nourished, no acute distress   Skin: Warm & dry, normal coloration and turgor, no rashes   Neurologic: Alert & oriented, normal mood   Cardiovascular: Regular rate & rhythm   Respiratory: Effort & rate normal, LCTAB, acyanotic   Abdomen: Soft, non tender   Extremities: normal strength, tone  Thin prep pap smear neg 02/2015  FHR: + via informal transabdominal u/s   Assessment:   Pregnancy: [id number] Patient Active Problem List  Diagnosis Date Noted  . Supervision of normal pregnancy 10/11/2016  . History of shoulder dystocia in prior pregnancy, currently pregnant 10/11/2016  . PIH (pregnancy induced hypertension)in previous pregnancies 10/11/2016  . Knee pain, bilateral 01/31/2016  . Migraine headache 08/25/2014  . Hyperlipidemia 07/23/2012  . Palpitations 07/22/2012  . GERD (gastroesophageal reflux disease) 07/22/2012  . Insomnia 09/05/2010  . Allergic rhinitis 03/12/2010  . Morbid obesity (HCC) 10/09/2009    4151w5d [id number] New OB visit H/O pre-e x 2 H/O mild shoulder dystocia Palpitations Low progesterone  Plan:  Initial labs obtained Continue prenatal vitamins Problem list reviewed and updated Reviewed n/v relief measures and warning s/s to report Reviewed recommended weight gain based on pre-gravid BMI Encouraged  well-balanced diet Genetic Screening discussed Integrated Screen/Quad/AFP: declined Cystic fibrosis screening discussed declined Ultrasound discussed; fetal survey: requested Follow up in 4 weeks for visit [facility] completed, not sent- not applying for preg [place] Begin taking a 81mg  baby aspirin daily at 12 weeks of pregnancy to decrease risk of preeclampsia during pregnancy  OK to take lopressor prn palpitations  [name], Kimberly Randall CNM, [facility]BC 10/11/2016 10:22 AM

## 2016-10-11 NOTE — Patient Instructions (Addendum)
Begin taking a 81mg  baby aspirin daily at 12 weeks of pregnancy (7/15) to decrease risk of preeclampsia during pregnancy   Nausea & Vomiting  Have saltine crackers or pretzels by your bed and eat a few bites before you raise your head out of bed in the morning  Eat small frequent meals throughout the day instead of large meals  Drink plenty of fluids throughout the day to stay hydrated, just don't drink a lot of fluids with your meals.  This can make your stomach fill up faster making you feel sick  Do not brush your teeth right after you eat  Products with real ginger are good for nausea, like ginger ale and ginger hard candy Make sure it says made with real ginger!  Sucking on sour candy like lemon heads is also good for nausea  If your prenatal vitamins make you nauseated, take them at night so you will sleep through the nausea  Sea Bands  If you feel like you need medicine for the nausea & vomiting please let us know  If you are unable to keep any fluids or food down please let us know   Constipation  Drink plenty of fluid, preferably water, throughout the day  Eat foods high in fiber such as fruits, vegetables, and grains  Exercise, such as walking, is a good way to keep your bowels regular  Drink warm fluids, especially warm prune juice, or decaf coffee  Eat a 1/2 cup of real oatmeal (not instant), 1/2 cup applesauce, and 1/2-1 cup warm prune juice every day  If needed, you may take Colace (docusate sodium) stool softener once or twice a day to help keep the stool soft. If you are pregnant, wait until you are out of your first trimester (12-14 weeks of pregnancy)  If you still are having problems with constipation, you may take Miralax once daily as needed to help keep your bowels regular.  If you are pregnant, wait until you are out of your first trimester (12-14 weeks of pregnancy)   First Trimester of Pregnancy The first trimester of pregnancy is from week 1 until  the end of week 12 (months 1 through 3). A week after a sperm fertilizes an egg, the egg will implant on the wall of the uterus. This embryo will begin to develop into a baby. Genes from you and your partner are forming the baby. The female genes determine whether the baby is a boy or a girl. At 6-8 weeks, the eyes and face are formed, and the heartbeat can be seen on ultrasound. At the end of 12 weeks, all the baby's organs are formed.  Now that you are pregnant, you will want to do everything you can to have a healthy baby. Two of the most important things are to get good prenatal care and to follow your health care provider's instructions. Prenatal care is all the medical care you receive before the baby's birth. This care will help prevent, find, and treat any problems during the pregnancy and childbirth. BODY CHANGES Your body goes through many changes during pregnancy. The changes vary from woman to woman.   You may gain or lose a couple of pounds at first.  You may feel sick to your stomach (nauseous) and throw up (vomit). If the vomiting is uncontrollable, call your health care provider.  You may tire easily.  You may develop headaches that can be relieved by medicines approved by your health care provider.  You may urinate  more often. Painful urination may mean you have a bladder infection.  You may develop heartburn as a result of your pregnancy.  You may develop constipation because certain hormones are causing the muscles that push waste through your intestines to slow down.  You may develop hemorrhoids or swollen, bulging veins (varicose veins).  Your breasts may begin to grow larger and become tender. Your nipples may stick out more, and the tissue that surrounds them (areola) may become darker.  Your gums may bleed and may be sensitive to brushing and flossing.  Dark spots or blotches (chloasma, mask of pregnancy) may develop on your face. This will likely fade after the baby is  born.  Your menstrual periods will stop.  You may have a loss of appetite.  You may develop cravings for certain kinds of food.  You may have changes in your emotions from day to day, such as being excited to be pregnant or being concerned that something may go wrong with the pregnancy and baby.  You may have more vivid and strange dreams.  You may have changes in your hair. These can include thickening of your hair, rapid growth, and changes in texture. Some women also have hair loss during or after pregnancy, or hair that feels dry or thin. Your hair will most likely return to normal after your baby is born. WHAT TO EXPECT AT YOUR PRENATAL VISITS During a routine prenatal visit:  You will be weighed to make sure you and the baby are growing normally.  Your blood pressure will be taken.  Your abdomen will be measured to track your baby's growth.  The fetal heartbeat will be listened to starting around week 10 or 12 of your pregnancy.  Test results from any previous visits will be discussed. Your health care provider may ask you:  How you are feeling.  If you are feeling the baby move.  If you have had any abnormal symptoms, such as leaking fluid, bleeding, severe headaches, or abdominal cramping.  If you have any questions. Other tests that may be performed during your first trimester include:  Blood tests to find your blood type and to check for the presence of any previous infections. They will also be used to check for low iron levels (anemia) and Rh antibodies. Later in the pregnancy, blood tests for diabetes will be done along with other tests if problems develop.  Urine tests to check for infections, diabetes, or protein in the urine.  An ultrasound to confirm the proper growth and development of the baby.  An amniocentesis to check for possible genetic problems.  Fetal screens for spina bifida and Down syndrome.  You may need other tests to make sure you and the  baby are doing well. HOME CARE INSTRUCTIONS  Medicines  Follow your health care provider's instructions regarding medicine use. Specific medicines may be either safe or unsafe to take during pregnancy.  Take your prenatal vitamins as directed.  If you develop constipation, try taking a stool softener if your health care provider approves. Diet  Eat regular, well-balanced meals. Choose a variety of foods, such as meat or vegetable-based protein, fish, milk and low-fat dairy products, vegetables, fruits, and whole grain breads and cereals. Your health care provider will help you determine the amount of weight gain that is right for you.  Avoid raw meat and uncooked cheese. These carry germs that can cause birth defects in the baby.  Eating four or five small meals rather than three   large meals a day may help relieve nausea and vomiting. If you start to feel nauseous, eating a few soda crackers can be helpful. Drinking liquids between meals instead of during meals also seems to help nausea and vomiting.  If you develop constipation, eat more high-fiber foods, such as fresh vegetables or fruit and whole grains. Drink enough fluids to keep your urine clear or pale yellow. Activity and Exercise  Exercise only as directed by your health care provider. Exercising will help you:  Control your weight.  Stay in shape.  Be prepared for labor and delivery.  Experiencing pain or cramping in the lower abdomen or low back is a good sign that you should stop exercising. Check with your health care provider before continuing normal exercises.  Try to avoid standing for long periods of time. Move your legs often if you must stand in one place for a long time.  Avoid heavy lifting.  Wear low-heeled shoes, and practice good posture.  You may continue to have sex unless your health care provider directs you otherwise. Relief of Pain or Discomfort  Wear a good support bra for breast tenderness.    Take warm sitz baths to soothe any pain or discomfort caused by hemorrhoids. Use hemorrhoid cream if your health care provider approves.   Rest with your legs elevated if you have leg cramps or low back pain.  If you develop varicose veins in your legs, wear support hose. Elevate your feet for 15 minutes, 3-4 times a day. Limit salt in your diet. Prenatal Care  Schedule your prenatal visits by the twelfth week of pregnancy. They are usually scheduled monthly at first, then more often in the last 2 months before delivery.  Write down your questions. Take them to your prenatal visits.  Keep all your prenatal visits as directed by your health care provider. Safety  Wear your seat belt at all times when driving.  Make a list of emergency phone numbers, including numbers for family, friends, the hospital, and police and fire departments. General Tips  Ask your health care provider for a referral to a local prenatal education class. Begin classes no later than at the beginning of month 6 of your pregnancy.  Ask for help if you have counseling or nutritional needs during pregnancy. Your health care provider can offer advice or refer you to specialists for help with various needs.  Do not use hot tubs, steam rooms, or saunas.  Do not douche or use tampons or scented sanitary pads.  Do not cross your legs for long periods of time.  Avoid cat litter boxes and soil used by cats. These carry germs that can cause birth defects in the baby and possibly loss of the fetus by miscarriage or stillbirth.  Avoid all smoking, herbs, alcohol, and medicines not prescribed by your health care provider. Chemicals in these affect the formation and growth of the baby.  Schedule a dentist appointment. At home, brush your teeth with a soft toothbrush and be gentle when you floss. SEEK MEDICAL CARE IF:   You have dizziness.  You have mild pelvic cramps, pelvic pressure, or nagging pain in the abdominal  area.  You have persistent nausea, vomiting, or diarrhea.  You have a bad smelling vaginal discharge.  You have pain with urination.  You notice increased swelling in your face, hands, legs, or ankles. SEEK IMMEDIATE MEDICAL CARE IF:   You have a fever.  You are leaking fluid from your vagina.  You   have spotting or bleeding from your vagina.  You have severe abdominal cramping or pain.  You have rapid weight gain or loss.  You vomit blood or material that looks like coffee grounds.  You are exposed to German measles and have never had them.  You are exposed to fifth disease or chickenpox.  You develop a severe headache.  You have shortness of breath.  You have any kind of trauma, such as from a fall or a car accident. Document Released: 03/26/2001 Document Revised: 08/16/2013 Document Reviewed: 02/09/2013 ExitCare Patient Information 2015 [employer]. This information is not intended to replace advice given to you by your health care provider. Make sure you discuss any questions you have with your health care provider.   

## 2016-10-12 LAB — MICROSCOPIC EXAMINATION: Casts: NONE SEEN /lpf

## 2016-10-12 LAB — ANTIBODY SCREEN: Antibody Screen: NEGATIVE

## 2016-10-12 LAB — URINALYSIS, ROUTINE W REFLEX MICROSCOPIC
Bilirubin, UA: NEGATIVE
Glucose, UA: NEGATIVE
Ketones, UA: NEGATIVE
Nitrite, UA: NEGATIVE
Protein, UA: NEGATIVE
RBC, UA: NEGATIVE
Specific Gravity, UA: 1.012 (ref 1.005–1.030)
Urobilinogen, Ur: 0.2 mg/dL (ref 0.2–1.0)
pH, UA: 6.5 (ref 5.0–7.5)

## 2016-10-12 LAB — RUBELLA SCREEN: Rubella Antibodies, IGG: 2.02 index (ref >0.99)

## 2016-10-12 LAB — CBC
Hematocrit: 42 % (ref 34.0–46.6)
Hemoglobin: 14.2 g/dL (ref 11.1–15.9)
MCH: 28.9 pg (ref 26.6–33.0)
MCHC: 33.8 g/dL (ref 31.5–35.7)
MCV: 85 fL (ref 79–97)
Platelets: 364 10*3/uL (ref 150–379)
RBC: 4.92 x10E6/uL (ref 3.77–5.28)
RDW: 15.1 % (ref 12.3–15.4)
WBC: 11.3 10*3/uL — ABNORMAL HIGH (ref 3.4–10.8)

## 2016-10-12 LAB — RPR: RPR Ser Ql: NONREACTIVE

## 2016-10-12 LAB — ABO/RH: Rh Factor: POSITIVE

## 2016-10-12 LAB — HIV ANTIBODY (ROUTINE TESTING W REFLEX): HIV Screen 4th Generation wRfx: NONREACTIVE

## 2016-10-12 LAB — VARICELLA ZOSTER ANTIBODY, IGG: Varicella zoster IgG: 703 index (ref >165)

## 2016-10-12 LAB — HEPATITIS B SURFACE ANTIGEN: Hepatitis B Surface Ag: NEGATIVE

## 2016-10-13 LAB — GC/CHLAMYDIA PROBE AMP
Chlamydia trachomatis, NAA: NEGATIVE
Neisseria gonorrhoeae by PCR: NEGATIVE

## 2016-10-13 LAB — URINE CULTURE

## 2016-10-17 MED FILL — PROGESTERONE 200 MG CAPSULE: 200 | 30 days supply | Qty: 30 | Fill #1

## 2016-11-08 ENCOUNTER — Encounter: Payer: Self-pay | Admitting: Obstetrics and Gynecology

## 2016-11-08 ENCOUNTER — Ambulatory Visit (INDEPENDENT_AMBULATORY_CARE_PROVIDER_SITE_OTHER): Payer: 59 | Admitting: Obstetrics and Gynecology

## 2016-11-08 VITALS — BP 124/82 | HR 86 | Wt 231.4 lb

## 2016-11-08 DIAGNOSIS — O09891 Supervision of other high risk pregnancies, first trimester: Secondary | ICD-10-CM

## 2016-11-08 DIAGNOSIS — Z1389 Encounter for screening for other disorder: Secondary | ICD-10-CM

## 2016-11-08 DIAGNOSIS — Z3A13 13 weeks gestation of pregnancy: Secondary | ICD-10-CM

## 2016-11-08 DIAGNOSIS — O99211 Obesity complicating pregnancy, first trimester: Secondary | ICD-10-CM

## 2016-11-08 DIAGNOSIS — Z331 Pregnant state, incidental: Secondary | ICD-10-CM

## 2016-11-08 DIAGNOSIS — Z3481 Encounter for supervision of other normal pregnancy, first trimester: Secondary | ICD-10-CM

## 2016-11-08 LAB — POCT URINALYSIS DIPSTICK
Blood, UA: NEGATIVE
Glucose, UA: NEGATIVE
Ketones, UA: NEGATIVE
Leukocytes, UA: NEGATIVE
Nitrite, UA: NEGATIVE
Protein, UA: NEGATIVE

## 2016-11-08 NOTE — Progress Notes (Signed)
[id number] 6639w5d Estimated Date of Delivery: 05/11/17 LROB  Patient reports good fetal movement, denies any bleeding and no rupture of membranes symptoms or regular contractions. Patient complaints: No complaints at this time. 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 Denies any other symptoms.   Blood pressure 124/82, pulse 86, weight 231 lb 6.4 oz (105 kg), last menstrual period 08/04/2016. refer to the ob flow sheet for FH and FHR, also BP, Wt, Urine results:notable for negative                          Physical Examination:  General appearance - alert, well appearing, and in no distress Abdomen                   -FHR: unable to obtain via doppler, fetal movement noted and +FT on bedside US. soft, nontender, nondistended, no masses or organomegaly                                            Questions were answered. Assessment: LROB [id number] @ 2539w5d  Plan:  Continued routine obstetrical care, 2nd IT, and US at next visit.  F/u in 4 weeks for routine LROB  By signing my name below, I, [name], attest that this documentation has been prepared under the direction and in the presence of Tilda BurrowFerguson, Jaydis Duchene V, MD. Electronically Signed: Soijett Andrews, ED Scribe. 11/08/16. 9:19 AM.  I personally performed the services described in this documentation, which was SCRIBED in my presence. The recorded information has been reviewed and considered accurate. It has been edited as necessary during review. Tilda BurrowFERGUSON,Karter Hellmer V, MD

## 2016-12-11 ENCOUNTER — Other Ambulatory Visit: Payer: Self-pay | Admitting: Obstetrics and Gynecology

## 2016-12-11 DIAGNOSIS — Z363 Encounter for antenatal screening for malformations: Secondary | ICD-10-CM

## 2016-12-13 ENCOUNTER — Encounter: Payer: Self-pay | Admitting: Obstetrics and Gynecology

## 2016-12-13 ENCOUNTER — Ambulatory Visit (INDEPENDENT_AMBULATORY_CARE_PROVIDER_SITE_OTHER): Payer: 59 | Admitting: Obstetrics and Gynecology

## 2016-12-13 ENCOUNTER — Ambulatory Visit (INDEPENDENT_AMBULATORY_CARE_PROVIDER_SITE_OTHER): Payer: 59

## 2016-12-13 VITALS — BP 120/72 | HR 70 | Wt 235.5 lb

## 2016-12-13 DIAGNOSIS — Z363 Encounter for antenatal screening for malformations: Secondary | ICD-10-CM

## 2016-12-13 DIAGNOSIS — Z3402 Encounter for supervision of normal first pregnancy, second trimester: Secondary | ICD-10-CM

## 2016-12-13 DIAGNOSIS — Z1389 Encounter for screening for other disorder: Secondary | ICD-10-CM

## 2016-12-13 DIAGNOSIS — Z3482 Encounter for supervision of other normal pregnancy, second trimester: Secondary | ICD-10-CM

## 2016-12-13 DIAGNOSIS — Z3A18 18 weeks gestation of pregnancy: Secondary | ICD-10-CM

## 2016-12-13 DIAGNOSIS — Z331 Pregnant state, incidental: Secondary | ICD-10-CM

## 2016-12-13 LAB — POCT URINALYSIS DIPSTICK
Blood, UA: NEGATIVE
Glucose, UA: NEGATIVE
Ketones, UA: NEGATIVE
Leukocytes, UA: NEGATIVE
Nitrite, UA: NEGATIVE
Protein, UA: NEGATIVE

## 2016-12-13 NOTE — Progress Notes (Signed)
US 18+5 wks,cephalic,cx 4.2 cm,post pl gr 0,normal ovaries bilat,svp of fluid 4.5 cm,fhr 146 bpm,efw 258 g,anatomy complete,no obvious abnormalities

## 2016-12-13 NOTE — Progress Notes (Signed)
Rolan BuccoJennifer M Eckrich is a 36 y.o. female [id number]  Estimated Date of Delivery: 05/11/17 LROB 5572w5d  Chief Complaint  Patient presents with  . Routine Prenatal Visit    US today  ____  Patient has no complaints. 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  Blood pressure 120/72, pulse 70, weight 235 lb 8 oz (106.8 kg), last menstrual period 08/04/2016.   Urine results: negative refer to the ob flow sheet for FH and FHR, ,                          Physical Examination: General appearance - alert, well appearing, and in no distress                                      Abdomen - FH not done                                                        -FHR 146                                                         soft, nontender, nondistended, no masses or organomegaly                                      Pelvic - not indicated                                            Questions were answered. Assessment: LROB [id number] @ 5272w5d Estimated Date of Delivery: 05/11/17  Plan:  Continued routine obstetrical care, LROB   F/u in 4 weeks for LROB    By signing my name below, I, Izna Ahmed, attest that this documentation has been prepared under the direction and in the presence of Tilda BurrowFerguson, Anasophia Pecor V, MD. Electronically Signed: Redge GainerIzna Ahmed, Medical Scribe. 12/13/16. 10:34 AM.  I personally performed the services described in this documentation, which was SCRIBED in my presence. The recorded information has been reviewed and considered accurate. It has been edited as necessary during review. Tilda BurrowFERGUSON,Makalyn Lennox V, MD

## 2017-01-13 ENCOUNTER — Ambulatory Visit (INDEPENDENT_AMBULATORY_CARE_PROVIDER_SITE_OTHER): Payer: 59 | Admitting: Obstetrics & Gynecology

## 2017-01-13 ENCOUNTER — Encounter: Payer: Self-pay | Admitting: Obstetrics & Gynecology

## 2017-01-13 VITALS — BP 122/82 | HR 107 | Wt 241.0 lb

## 2017-01-13 DIAGNOSIS — Z1389 Encounter for screening for other disorder: Secondary | ICD-10-CM

## 2017-01-13 DIAGNOSIS — Z331 Pregnant state, incidental: Secondary | ICD-10-CM

## 2017-01-13 DIAGNOSIS — Z3A23 23 weeks gestation of pregnancy: Secondary | ICD-10-CM

## 2017-01-13 DIAGNOSIS — Z3482 Encounter for supervision of other normal pregnancy, second trimester: Secondary | ICD-10-CM

## 2017-01-13 LAB — POCT URINALYSIS DIPSTICK
Blood, UA: NEGATIVE
Glucose, UA: NEGATIVE
Ketones, UA: NEGATIVE
Leukocytes, UA: NEGATIVE
Nitrite, UA: NEGATIVE
Protein, UA: NEGATIVE

## 2017-01-13 NOTE — Progress Notes (Signed)
[id number] [redacted]w[redacted]d Estimated Date of Delivery: 05/11/17  Blood pressure 122/82, pulse (!) 107, weight 241 lb (109.3 kg), last menstrual period 08/04/2016.   BP weight and urine results all reviewed and noted.  Please refer to the obstetrical flow sheet for the fundal height and fetal heart rate documentation:  Patient reports good fetal movement, denies any bleeding and no rupture of membranes symptoms or regular contractions. Patient is without complaints. All questions were answered.  Orders Placed This Encounter  Procedures  . POCT urinalysis dipstick    Plan:  Continued routine obstetrical care, PN2 next visit  Return in about 4 weeks (around 02/10/2017) for PN2, , LROB.

## 2017-01-13 NOTE — Addendum Note (Signed)
Addended by: EURE, LUTHER H on: 01/13/2017 09:42 AM   Modules accepted: Level of Service  

## 2017-02-11 ENCOUNTER — Ambulatory Visit (INDEPENDENT_AMBULATORY_CARE_PROVIDER_SITE_OTHER): Payer: 59 | Admitting: Women's Health

## 2017-02-11 ENCOUNTER — Encounter: Payer: Self-pay | Admitting: Women's Health

## 2017-02-11 ENCOUNTER — Other Ambulatory Visit: Payer: 59

## 2017-02-11 VITALS — BP 126/74 | HR 82 | Wt 246.0 lb

## 2017-02-11 DIAGNOSIS — Z3482 Encounter for supervision of other normal pregnancy, second trimester: Secondary | ICD-10-CM

## 2017-02-11 DIAGNOSIS — Z3A27 27 weeks gestation of pregnancy: Secondary | ICD-10-CM

## 2017-02-11 DIAGNOSIS — Z1389 Encounter for screening for other disorder: Secondary | ICD-10-CM

## 2017-02-11 DIAGNOSIS — Z331 Pregnant state, incidental: Secondary | ICD-10-CM

## 2017-02-11 LAB — POCT URINALYSIS DIPSTICK
Blood, UA: NEGATIVE
Glucose, UA: NEGATIVE
Ketones, UA: NEGATIVE
Leukocytes, UA: NEGATIVE
Nitrite, UA: NEGATIVE
Protein, UA: NEGATIVE

## 2017-02-11 MED ORDER — PANTOPRAZOLE SODIUM 20 MG PO TBEC: 20.0000 mg | DELAYED_RELEASE_TABLET | Freq: Every day | ORAL | 6 refills | Status: DC

## 2017-02-11 NOTE — Patient Instructions (Signed)
Wanda BevelsJennifer M Andrews, I greatly value your feedback.  If you receive a survey following your visit with us today, we appreciate you taking the time to fill it out.  Thanks, Wanda HaffKim Atley Andrews, CNM, WHNP-BC   Call the office [phone]) or go to [facility] if:  You begin to have strong, frequent contractions  Your water breaks.  Sometimes it is a big gush of fluid, sometimes it is just a trickle that keeps getting your panties wet or running down your legs  You have vaginal bleeding.  It is normal to have a small amount of spotting if your cervix was checked.   You don't feel your baby moving like normal.  If you don't, get you something to eat and drink and lay down and focus on feeling your baby move.  You should feel at least 10 movements in 2 hours.  If you don't, you should call the office or [facility] to [facility].    Tdap Vaccine  It is recommended that you get the Tdap vaccine during the third trimester of EACH pregnancy to help protect your baby from getting pertussis (whooping cough)  27-36 weeks is the BEST time to do this so that you can pass the protection on to your baby. During pregnancy is better than after pregnancy, but if you are unable to get it during pregnancy it will be offered at the hospital.   You can get this vaccine at the health department or your family doctor  Everyone who will be around your baby should also be up-to-date on their vaccines. Adults (who are not pregnant) only need 1 dose of Tdap during adulthood.   Third Trimester of Pregnancy The third trimester is from week 29 through week 42, months 7 through 9. The third trimester is a time when the fetus is growing rapidly. At the end of the ninth month, the fetus is about 20 inches in length and weighs 6-10 pounds.  BODY CHANGES Your body goes through many changes during pregnancy. The changes vary from woman to woman.   Your weight will continue to increase. You can expect to gain 25-35 pounds (11-16 kg) by  the end of the pregnancy.  You may begin to get stretch marks on your hips, abdomen, and breasts.  You may urinate more often because the fetus is moving lower into your pelvis and pressing on your bladder.  You may develop or continue to have heartburn as a result of your pregnancy.  You may develop constipation because certain hormones are causing the muscles that push waste through your intestines to slow down.  You may develop hemorrhoids or swollen, bulging veins (varicose veins).  You may have pelvic pain because of the weight gain and pregnancy hormones relaxing your joints between the bones in your pelvis. Backaches may result from overexertion of the muscles supporting your posture.  You may have changes in your hair. These can include thickening of your hair, rapid growth, and changes in texture. Some women also have hair loss during or after pregnancy, or hair that feels dry or thin. Your hair will most likely return to normal after your baby is born.  Your breasts will continue to grow and be tender. A yellow discharge may leak from your breasts called colostrum.  Your belly button may stick out.  You may feel short of breath because of your expanding uterus.  You may notice the fetus "dropping," or moving lower in your abdomen.  You may have a bloody  mucus discharge. This usually occurs a few days to a week before labor begins.  Your cervix becomes thin and soft (effaced) near your due date. WHAT TO EXPECT AT YOUR PRENATAL EXAMS  You will have prenatal exams every 2 weeks until week 36. Then, you will have weekly prenatal exams. During a routine prenatal visit:  You will be weighed to make sure you and the fetus are growing normally.  Your blood pressure is taken.  Your abdomen will be measured to track your baby's growth.  The fetal heartbeat will be listened to.  Any test results from the previous visit will be discussed.  You may have a cervical check near your  due date to see if you have effaced. At around 36 weeks, your caregiver will check your cervix. At the same time, your caregiver will also perform a test on the secretions of the vaginal tissue. This test is to determine if a type of bacteria, Group B streptococcus, is present. Your caregiver will explain this further. Your caregiver may ask you:  What your birth plan is.  How you are feeling.  If you are feeling the baby move.  If you have had any abnormal symptoms, such as leaking fluid, bleeding, severe headaches, or abdominal cramping.  If you have any questions. Other tests or screenings that may be performed during your third trimester include:  Blood tests that check for low iron levels (anemia).  Fetal testing to check the health, activity level, and growth of the fetus. Testing is done if you have certain medical conditions or if there are problems during the pregnancy. FALSE LABOR You may feel small, irregular contractions that eventually go away. These are called Braxton Hicks contractions, or false labor. Contractions may last for hours, days, or even weeks before true labor sets in. If contractions come at regular intervals, intensify, or become painful, it is best to be seen by your caregiver.  SIGNS OF LABOR   Menstrual-like cramps.  Contractions that are 5 minutes apart or less.  Contractions that start on the top of the uterus and spread down to the lower abdomen and back.  A sense of increased pelvic pressure or back pain.  A watery or bloody mucus discharge that comes from the vagina. If you have any of these signs before the 37th week of pregnancy, call your caregiver right away. You need to go to the hospital to get checked immediately. HOME CARE INSTRUCTIONS   Avoid all smoking, herbs, alcohol, and unprescribed drugs. These chemicals affect the formation and growth of the baby.  Follow your caregiver's instructions regarding medicine use. There are medicines  that are either safe or unsafe to take during pregnancy.  Exercise only as directed by your caregiver. Experiencing uterine cramps is a good sign to stop exercising.  Continue to eat regular, healthy meals.  Wear a good support bra for breast tenderness.  Do not use hot tubs, steam rooms, or saunas.  Wear your seat belt at all times when driving.  Avoid raw meat, uncooked cheese, cat litter boxes, and soil used by cats. These carry germs that can cause birth defects in the baby.  Take your prenatal vitamins.  Try taking a stool softener (if your caregiver approves) if you develop constipation. Eat more high-fiber foods, such as fresh vegetables or fruit and whole grains. Drink plenty of fluids to keep your urine clear or pale yellow.  Take warm sitz baths to soothe any pain or discomfort caused by hemorrhoids.  Use hemorrhoid cream if your caregiver approves.  If you develop varicose veins, wear support hose. Elevate your feet for 15 minutes, 3-4 times a day. Limit salt in your diet.  Avoid heavy lifting, wear low heal shoes, and practice good posture.  Rest a lot with your legs elevated if you have leg cramps or low back pain.  Visit your dentist if you have not gone during your pregnancy. Use a soft toothbrush to brush your teeth and be gentle when you floss.  A sexual relationship may be continued unless your caregiver directs you otherwise.  Do not travel far distances unless it is absolutely necessary and only with the approval of your caregiver.  Take prenatal classes to understand, practice, and ask questions about the labor and delivery.  Make a trial run to the hospital.  Pack your hospital bag.  Prepare the baby's nursery.  Continue to go to all your prenatal visits as directed by your caregiver. SEEK MEDICAL CARE IF:  You are unsure if you are in labor or if your water has broken.  You have dizziness.  You have mild pelvic cramps, pelvic pressure, or nagging  pain in your abdominal area.  You have persistent nausea, vomiting, or diarrhea.  You have a bad smelling vaginal discharge.  You have pain with urination. SEEK IMMEDIATE MEDICAL CARE IF:   You have a fever.  You are leaking fluid from your vagina.  You have spotting or bleeding from your vagina.  You have severe abdominal cramping or pain.  You have rapid weight loss or gain.  You have shortness of breath with chest pain.  You notice sudden or extreme swelling of your face, hands, ankles, feet, or legs.  You have not felt your baby move in over an hour.  You have severe headaches that do not go away with medicine.  You have vision changes. Document Released: 03/26/2001 Document Revised: 04/06/2013 Document Reviewed: 06/02/2012 [employer] Patient Information 2015 [employer]. This information is not intended to replace advice given to you by your health care provider. Make sure you discuss any questions you have with your health care provider.   

## 2017-02-11 NOTE — Addendum Note (Signed)
Addended by: Shawna ClampBOOKER, Elbony Mcclimans R on: 02/11/2017 01:43 PM   Modules accepted: Orders

## 2017-02-11 NOTE — Progress Notes (Signed)
   LOW-RISK PREGNANCY VISIT Patient name: [name] MRN [id number]  Date of birth: [birth year] Chief Complaint:   Routine Prenatal Visit  History of Present Illness:   Wanda BuccoJennifer M Andrews is a 36 y.o. [id number] female at 330w2d with an Estimated Date of Delivery: 05/11/17 being seen today for ongoing management of a low-risk pregnancy.  Today she reports no complaints.  . Vag. Bleeding: None.  Movement: Present. denies leaking of fluid. Review of Systems:   Pertinent items are noted in HPI Denies abnormal vaginal discharge w/ itching/odor/irritation, headaches, visual changes, shortness of breath, chest pain, abdominal pain, severe nausea/vomiting, or problems with urination or bowel movements unless otherwise stated above. Pertinent History Reviewed:  Reviewed past medical,surgical, social, obstetrical and family history.  Reviewed problem list, medications and allergies. Physical Assessment:   Vitals:   02/11/17 0911  BP: 126/74  Pulse: 82  Weight: 246 lb (111.6 kg)  Body mass index is 44.99 kg/m.        Physical Examination:   General appearance: Well appearing, and in no distress  Mental status: Alert, oriented to person, place, and time  Skin: Warm & dry  Cardiovascular: Normal heart rate noted  Respiratory: Normal respiratory effort, no distress  Abdomen: Soft, gravid, nontender  Pelvic: Cervical exam deferred         Extremities: Edema: None  Fetal Status: Fetal Heart Rate (bpm): 148 Fundal Height: 30 cm Movement: Present    Results for orders placed or performed in visit on 02/11/17 (from the past 24 hour(s))  POCT Urinalysis Dipstick   Collection Time: 02/11/17  9:15 AM  Result Value Ref Range   Color, UA     Clarity, UA     Glucose, UA negative    Bilirubin, UA     Ketones, UA negative    Spec Grav, UA  1.010 - 1.025   Blood, UA negative    pH, UA  5.0 - 8.0   Protein, UA negative    Urobilinogen, UA  0.2 or 1.0 E.U./dL   Nitrite, UA negative    Leukocytes,  UA Negative Negative    Assessment & Plan:  1) Low-risk pregnancy [id number] at 10030w2d with an Estimated Date of Delivery: 05/11/17    Labs/procedures today: pn2  Plan:  Continue routine obstetrical care   Reviewed: Preterm labor symptoms and general obstetric precautions including but not limited to vaginal bleeding, contractions, leaking of fluid and fetal movement were reviewed in detail with the patient.  Recommended Tdap at HD/PCP per CDC guidelines. All questions were answered  Follow-up: Return in about 3 weeks (around 03/04/2017) for LROB.  Orders Placed This Encounter  Procedures  . POCT Urinalysis Dipstick   [name], Iridian Reader Randall CNM, [facility] 02/11/2017 9:40 AM

## 2017-02-12 ENCOUNTER — Telehealth: Payer: Self-pay | Admitting: *Deleted

## 2017-02-12 DIAGNOSIS — O24410 Gestational diabetes mellitus in pregnancy, diet controlled: Secondary | ICD-10-CM

## 2017-02-12 LAB — HIV ANTIBODY (ROUTINE TESTING W REFLEX): HIV Screen 4th Generation wRfx: NONREACTIVE

## 2017-02-12 LAB — GLUCOSE TOLERANCE, 2 HOURS W/ 1HR
Glucose, 1 hour: 180 mg/dL — ABNORMAL HIGH (ref 65–179)
Glucose, 2 hour: 148 mg/dL (ref 65–152)
Glucose, Fasting: 77 mg/dL (ref 65–91)

## 2017-02-12 LAB — CBC
Hematocrit: 35.6 % (ref 34.0–46.6)
Hemoglobin: 11.9 g/dL (ref 11.1–15.9)
MCH: 28.6 pg (ref 26.6–33.0)
MCHC: 33.4 g/dL (ref 31.5–35.7)
MCV: 86 fL (ref 79–97)
Platelets: 323 10*3/uL (ref 150–379)
RBC: 4.16 x10E6/uL (ref 3.77–5.28)
RDW: 14.6 % (ref 12.3–15.4)
WBC: 11.7 10*3/uL — ABNORMAL HIGH (ref 3.4–10.8)

## 2017-02-12 LAB — ANTIBODY SCREEN: Antibody Screen: NEGATIVE

## 2017-02-12 LAB — RPR: RPR Ser Ql: NONREACTIVE

## 2017-02-12 NOTE — Telephone Encounter (Signed)
LMOVM that she barely failed her GTT so referral was sent to dietician. Advised she will be checking BS 4 times daily. To call if she has further questions.

## 2017-02-26 ENCOUNTER — Ambulatory Visit: Payer: 59

## 2017-02-27 ENCOUNTER — Encounter: Payer: Self-pay | Admitting: Registered"

## 2017-02-27 ENCOUNTER — Encounter: Payer: 59 | Attending: Obstetrics & Gynecology | Admitting: Registered"

## 2017-02-27 DIAGNOSIS — R7309 Other abnormal glucose: Secondary | ICD-10-CM

## 2017-02-27 DIAGNOSIS — O24410 Gestational diabetes mellitus in pregnancy, diet controlled: Secondary | ICD-10-CM | POA: Diagnosis not present

## 2017-02-27 DIAGNOSIS — Z713 Dietary counseling and surveillance: Secondary | ICD-10-CM | POA: Diagnosis not present

## 2017-02-27 NOTE — Progress Notes (Signed)
Patient was seen on 02/27/17 for Gestational Diabetes self-management class at the [facility]. The following learning objectives were met by the patient during this course:   States the definition of Gestational Diabetes  States why dietary management is important in controlling blood glucose  Describes the effects each nutrient has on blood glucose levels  Demonstrates ability to create a balanced meal plan  Demonstrates carbohydrate counting   States when to check blood glucose levels  Demonstrates proper blood glucose monitoring techniques  States the effect of stress and exercise on blood glucose levels  States the importance of limiting caffeine and abstaining from alcohol and smoking  Blood glucose monitor given: [employer] Lot # [id number] X Exp: 05/15/2018 Blood glucose reading: 79  Patient instructed to monitor glucose levels: FBS: 60 - <95 1 hour: <140 2 hour: <120  Patient received handouts:  Nutrition Diabetes and Pregnancy  Carbohydrate Counting List  Patient will be seen for follow-up as needed.

## 2017-02-28 ENCOUNTER — Encounter: Payer: Self-pay | Admitting: Women's Health

## 2017-02-28 MED ORDER — ONETOUCH LANCETS MISC: 99 refills | Status: DC

## 2017-02-28 MED ORDER — GLUCOSE BLOOD VI STRP: ORAL_STRIP | 99 refills | Status: DC

## 2017-02-28 MED FILL — FREESTYLE LITE TEST STRIP: 50 days supply | Qty: 200 | Fill #0

## 2017-02-28 MED FILL — FREESTYLE LITE METER: 30 days supply | Qty: 1 | Fill #0

## 2017-02-28 MED FILL — FREESTYLE LANCETS: 50 days supply | Qty: 200 | Fill #0

## 2017-03-04 ENCOUNTER — Ambulatory Visit (INDEPENDENT_AMBULATORY_CARE_PROVIDER_SITE_OTHER): Payer: 59 | Admitting: Advanced Practice Midwife

## 2017-03-04 ENCOUNTER — Encounter: Payer: Self-pay | Admitting: Advanced Practice Midwife

## 2017-03-04 VITALS — BP 120/80 | HR 98 | Wt 248.6 lb

## 2017-03-04 DIAGNOSIS — O0990 Supervision of high risk pregnancy, unspecified, unspecified trimester: Secondary | ICD-10-CM

## 2017-03-04 DIAGNOSIS — O24410 Gestational diabetes mellitus in pregnancy, diet controlled: Secondary | ICD-10-CM

## 2017-03-04 DIAGNOSIS — Z8759 Personal history of other complications of pregnancy, childbirth and the puerperium: Secondary | ICD-10-CM

## 2017-03-04 DIAGNOSIS — Z1389 Encounter for screening for other disorder: Secondary | ICD-10-CM

## 2017-03-04 DIAGNOSIS — Z3A30 30 weeks gestation of pregnancy: Secondary | ICD-10-CM

## 2017-03-04 DIAGNOSIS — O24419 Gestational diabetes mellitus in pregnancy, unspecified control: Secondary | ICD-10-CM | POA: Insufficient documentation

## 2017-03-04 LAB — POCT URINALYSIS DIPSTICK
Blood, UA: NEGATIVE
Glucose, UA: NEGATIVE
Leukocytes, UA: NEGATIVE
Nitrite, UA: NEGATIVE

## 2017-03-04 NOTE — Patient Instructions (Signed)
Wanda BevelsJennifer M Andrews, I greatly value your feedback.  If you receive a survey following your visit with us today, we appreciate you taking the time to fill it out.  Thanks, Cathie BeamsFran Cresenzo-Dishmon, CNM   Call the office [phone]) or go to [facility] if:  You begin to have strong, frequent contractions  Your water breaks.  Sometimes it is a big gush of fluid, sometimes it is just a trickle that keeps getting your panties wet or running down your legs  You have vaginal bleeding.  It is normal to have a small amount of spotting if your cervix was checked.   You don't feel your baby moving like normal.  If you don't, get you something to eat and drink and lay down and focus on feeling your baby move.  You should feel at least 10 movements in 2 hours.  If you don't, you should call the office or [facility] to [facility].    Tdap Vaccine  It is recommended that you get the Tdap vaccine during the third trimester of EACH pregnancy to help protect your baby from getting pertussis (whooping cough)  27-36 weeks is the BEST time to do this so that you can pass the protection on to your baby. During pregnancy is better than after pregnancy, but if you are unable to get it during pregnancy it will be offered at the hospital.   You can get this vaccine at the health department or your family doctor  Everyone who will be around your baby should also be up-to-date on their vaccines. Adults (who are not pregnant) only need 1 dose of Tdap during adulthood.   Third Trimester of Pregnancy The third trimester is from week 29 through week 42, months 7 through 9. The third trimester is a time when the fetus is growing rapidly. At the end of the ninth month, the fetus is about 20 inches in length and weighs 6-10 pounds.  BODY CHANGES Your body goes through many changes during pregnancy. The changes vary from woman to woman.   Your weight will continue to increase. You can expect to gain 25-35 pounds (11-16 kg)  by the end of the pregnancy.  You may begin to get stretch marks on your hips, abdomen, and breasts.  You may urinate more often because the fetus is moving lower into your pelvis and pressing on your bladder.  You may develop or continue to have heartburn as a result of your pregnancy.  You may develop constipation because certain hormones are causing the muscles that push waste through your intestines to slow down.  You may develop hemorrhoids or swollen, bulging veins (varicose veins).  You may have pelvic pain because of the weight gain and pregnancy hormones relaxing your joints between the bones in your pelvis. Backaches may result from overexertion of the muscles supporting your posture.  You may have changes in your hair. These can include thickening of your hair, rapid growth, and changes in texture. Some women also have hair loss during or after pregnancy, or hair that feels dry or thin. Your hair will most likely return to normal after your baby is born.  Your breasts will continue to grow and be tender. A yellow discharge may leak from your breasts called colostrum.  Your belly button may stick out.  You may feel short of breath because of your expanding uterus.  You may notice the fetus "dropping," or moving lower in your abdomen.  You may have a bloody mucus  discharge. This usually occurs a few days to a week before labor begins.  Your cervix becomes thin and soft (effaced) near your due date. WHAT TO EXPECT AT YOUR PRENATAL EXAMS  You will have prenatal exams every 2 weeks until week 36. Then, you will have weekly prenatal exams. During a routine prenatal visit:  You will be weighed to make sure you and the fetus are growing normally.  Your blood pressure is taken.  Your abdomen will be measured to track your baby's growth.  The fetal heartbeat will be listened to.  Any test results from the previous visit will be discussed.  You may have a cervical check near  your due date to see if you have effaced. At around 36 weeks, your caregiver will check your cervix. At the same time, your caregiver will also perform a test on the secretions of the vaginal tissue. This test is to determine if a type of bacteria, Group B streptococcus, is present. Your caregiver will explain this further. Your caregiver may ask you:  What your birth plan is.  How you are feeling.  If you are feeling the baby move.  If you have had any abnormal symptoms, such as leaking fluid, bleeding, severe headaches, or abdominal cramping.  If you have any questions. Other tests or screenings that may be performed during your third trimester include:  Blood tests that check for low iron levels (anemia).  Fetal testing to check the health, activity level, and growth of the fetus. Testing is done if you have certain medical conditions or if there are problems during the pregnancy. FALSE LABOR You may feel small, irregular contractions that eventually go away. These are called Braxton Hicks contractions, or false labor. Contractions may last for hours, days, or even weeks before true labor sets in. If contractions come at regular intervals, intensify, or become painful, it is best to be seen by your caregiver.  SIGNS OF LABOR   Menstrual-like cramps.  Contractions that are 5 minutes apart or less.  Contractions that start on the top of the uterus and spread down to the lower abdomen and back.  A sense of increased pelvic pressure or back pain.  A watery or bloody mucus discharge that comes from the vagina. If you have any of these signs before the 37th week of pregnancy, call your caregiver right away. You need to go to the hospital to get checked immediately. HOME CARE INSTRUCTIONS   Avoid all smoking, herbs, alcohol, and unprescribed drugs. These chemicals affect the formation and growth of the baby.  Follow your caregiver's instructions regarding medicine use. There are  medicines that are either safe or unsafe to take during pregnancy.  Exercise only as directed by your caregiver. Experiencing uterine cramps is a good sign to stop exercising.  Continue to eat regular, healthy meals.  Wear a good support bra for breast tenderness.  Do not use hot tubs, steam rooms, or saunas.  Wear your seat belt at all times when driving.  Avoid raw meat, uncooked cheese, cat litter boxes, and soil used by cats. These carry germs that can cause birth defects in the baby.  Take your prenatal vitamins.  Try taking a stool softener (if your caregiver approves) if you develop constipation. Eat more high-fiber foods, such as fresh vegetables or fruit and whole grains. Drink plenty of fluids to keep your urine clear or pale yellow.  Take warm sitz baths to soothe any pain or discomfort caused by hemorrhoids. Use  hemorrhoid cream if your caregiver approves.  If you develop varicose veins, wear support hose. Elevate your feet for 15 minutes, 3-4 times a day. Limit salt in your diet.  Avoid heavy lifting, wear low heal shoes, and practice good posture.  Rest a lot with your legs elevated if you have leg cramps or low back pain.  Visit your dentist if you have not gone during your pregnancy. Use a soft toothbrush to brush your teeth and be gentle when you floss.  A sexual relationship may be continued unless your caregiver directs you otherwise.  Do not travel far distances unless it is absolutely necessary and only with the approval of your caregiver.  Take prenatal classes to understand, practice, and ask questions about the labor and delivery.  Make a trial run to the hospital.  Pack your hospital bag.  Prepare the baby's nursery.  Continue to go to all your prenatal visits as directed by your caregiver. SEEK MEDICAL CARE IF:  You are unsure if you are in labor or if your water has broken.  You have dizziness.  You have mild pelvic cramps, pelvic pressure, or  nagging pain in your abdominal area.  You have persistent nausea, vomiting, or diarrhea.  You have a bad smelling vaginal discharge.  You have pain with urination. SEEK IMMEDIATE MEDICAL CARE IF:   You have a fever.  You are leaking fluid from your vagina.  You have spotting or bleeding from your vagina.  You have severe abdominal cramping or pain.  You have rapid weight loss or gain.  You have shortness of breath with chest pain.  You notice sudden or extreme swelling of your face, hands, ankles, feet, or legs.  You have not felt your baby move in over an hour.  You have severe headaches that do not go away with medicine.  You have vision changes. Document Released: 03/26/2001 Document Revised: 04/06/2013 Document Reviewed: 06/02/2012 [facility] Patient Information 2015 [place], [place]. This information is not intended to replace advice given to you by your health care provider. Make sure you discuss any questions you have with your health care provider.

## 2017-03-04 NOTE — Progress Notes (Signed)
HIGH-RISK PREGNANCY VISIT Patient name: [name] MRN [id number]  Date of birth: 1981-03-25 Chief Complaint:   Routine Prenatal Visit  History of Present Illness:   [name] is a 36 y.o. [id number] female at 3270w2d with an Estimated Date of Delivery: 05/11/17 being seen today for ongoing management of a high-risk pregnancy complicated by gestational DM.  Today she reports no complaints. Contractions: Not present.  .  Movement: Present. denies leaking of fluid.  Review of Systems:   Pertinent items are noted in HPI Denies abnormal vaginal discharge w/ itching/odor/irritation, headaches, visual changes, shortness of breath, chest pain, abdominal pain, severe nausea/vomiting, or problems with urination or bowel movements unless otherwise stated above.    Pertinent History Reviewed:  Medical & Surgical Hx:   Past Medical History:  Diagnosis Date  . Allergy   . Arthritis    BACK  . Back pain 09/05/2010  . Headache(784.0)    MIGRAINES  -- LAST ONE  09/2011  . Hyperlipidemia 07/23/2012  . Obesity (BMI 30-39.9) 10/09/2009  . Palpitations   . Pregnancy induced hypertension   . Pregnant 05/16/2015  . Vaginal spotting 05/16/2015   Past Surgical History:  Procedure Laterality Date  . DILATION AND CURETTAGE OF UTERUS    . LAPAROSCOPIC CHOLECYSTECTOMY N/A 08/24/2012   Performed by Dalia HeadingJenkins, Mark A, MD at [facility]  . LUMBAR LAMINECTOMY/DECOMPRESSION MICRODISCECTOMY 1 LEVEL Left 12/03/2011   [facility]manStern, Joseph[facility]  . TONSILLECTOMY    . WISDOM TOOTH EXTRACTION     Family History  Problem Relation Age of Onset  . Arthritis Mother   . Diabetes Mother   . Diabetes Maternal Grandmother   . Heart disease Maternal Grandmother   . Diabetes Paternal Grandmother   . Heart disease Paternal Grandmother   . Heart attack Paternal Grandmother   . Heart disease Paternal Grandfather   . Diabetes Paternal Grandfather     Current Outpatient Medications:  .  acetaminophen  (TYLENOL) 500 MG tablet, Take 1,000 mg by mouth as needed., Disp: , Rfl:  .  aspirin (ASPIRIN 81) 81 MG chewable tablet, Chew 81 mg by mouth daily., Disp: , Rfl:  .  butalbital-acetaminophen-caffeine (FIORICET) 50-325-40 MG tablet, One tablet twice daily as needed for migraine headache, Disp: 30 tablet, Rfl: 1 .  glucose blood test strip, Use as instructed, Disp: 100 each, Rfl: prn .  loratadine (CLARITIN) 10 MG tablet, Take 10 mg by mouth daily.  , Disp: , Rfl:  .  metoprolol (LOPRESSOR) 50 MG tablet, One tablet twice daily as needed, Disp: 60 tablet, Rfl: 5 .  montelukast (SINGULAIR) 10 MG tablet, TAKE 1 TABLET (10 MG TOTAL) BY MOUTH AT BEDTIME., Disp: 90 tablet, Rfl: 1 .  ONE TOUCH LANCETS MISC, Use as directed, Disp: 200 each, Rfl: prn .  pantoprazole (PROTONIX) 20 MG tablet, Take 1 tablet (20 mg total) by mouth daily., Disp: 30 tablet, Rfl: 6 .  Prenatal Vit-Fe Fumarate-FA (MULTIVITAMIN-PRENATAL) 27-0.8 MG TABS tablet, Take 1 tablet by mouth daily at 12 noon., Disp: , Rfl:  Social History: Reviewed -  reports that  has never smoked. she has never used smokeless tobacco.   Physical Assessment:   Vitals:   03/04/17 0851  BP: 120/80  Pulse: 98  Weight: 248 lb 9.6 oz (112.8 kg)  Body mass index is 45.47 kg/m.           Physical Examination:   General appearance: alert, well appearing, and in no distress  Mental status: alert, oriented to person, place, and time  Skin: warm & dry   Extremities: Edema: Trace    Cardiovascular: normal heart rate noted  Respiratory: normal respiratory effort, no distress  Abdomen: gravid, soft, non-tender  Pelvic: Cervical exam deferred         Fetal Status:     Movement: Present    Fetal Surveillance Testing today: doppler  Results for orders placed or performed in visit on 03/04/17 (from the past 24 hour(s))  POCT urinalysis dipstick   Collection Time: 03/04/17  8:54 AM  Result Value Ref Range   Color, UA     Clarity, UA     Glucose, UA neg     Bilirubin, UA     Ketones, UA small    Spec Grav, UA  1.010 - 1.025   Blood, UA neg    pH, UA  5.0 - 8.0   Protein, UA trace    Urobilinogen, UA  0.2 or 1.0 E.U./dL   Nitrite, UA neg    Leukocytes, UA Negative Negative    Assessment & Plan:  1) High-risk pregnancy [id number] at 5842w2d with an Estimated Date of Delivery: 05/11/17   2) A1DM, stable  3) hx preeclampsia,   Labs/procedures today: FBS <89 and 2 hr pp <110  Medications: ASA 81mg   Treatment Plan:  Continue QID blood sugar testing, EFW 36-38 weeks, IOL 40 weeks   Follow-up: Return in about 2 weeks (around 03/18/2017) for HROB.  Orders Placed This Encounter  Procedures  . POCT urinalysis dipstick   CRESENZO-DISHMAN,Soyla Bainter CNM 03/04/2017 9:09 AM

## 2017-03-18 ENCOUNTER — Ambulatory Visit: Payer: 59 | Admitting: Advanced Practice Midwife

## 2017-03-18 VITALS — BP 126/78 | HR 88 | Wt 252.0 lb

## 2017-03-18 DIAGNOSIS — O24410 Gestational diabetes mellitus in pregnancy, diet controlled: Secondary | ICD-10-CM

## 2017-03-18 DIAGNOSIS — Z1389 Encounter for screening for other disorder: Secondary | ICD-10-CM

## 2017-03-18 DIAGNOSIS — O0993 Supervision of high risk pregnancy, unspecified, third trimester: Secondary | ICD-10-CM

## 2017-03-18 DIAGNOSIS — Z3A32 32 weeks gestation of pregnancy: Secondary | ICD-10-CM

## 2017-03-18 DIAGNOSIS — Z331 Pregnant state, incidental: Secondary | ICD-10-CM

## 2017-03-18 LAB — POCT URINALYSIS DIPSTICK
Blood, UA: NEGATIVE
Glucose, UA: NEGATIVE
Ketones, UA: NEGATIVE
Leukocytes, UA: NEGATIVE
Nitrite, UA: NEGATIVE
Protein, UA: NEGATIVE

## 2017-03-18 NOTE — Progress Notes (Signed)
HIGH-RISK PREGNANCY VISIT Patient name: Wanda BuccoJennifer M Andrews MRN [id number]  Date of birth: [birth year] Chief Complaint:   Routine Prenatal Visit  History of Present Illness:   Wanda BuccoJennifer M Andrews is a 36 y.o. [id number] female at 8243w2d with an Estimated Date of Delivery: 05/11/17 being seen today for ongoing management of a high-risk pregnancy complicated by gestational DM, class  A1 DM.  Today she reports no complaints. Contractions: Not present. Vag. Bleeding: None.  Movement: Present. denies leaking of fluid.  Review of Systems:   Pertinent items are noted in HPI Denies abnormal vaginal discharge w/ itching/odor/irritation, headaches, visual changes, shortness of breath, chest pain, abdominal pain, severe nausea/vomiting, or problems with urination or bowel movements unless otherwise stated above.    Pertinent History Reviewed:  Medical & Surgical Hx:   Past Medical History:  Diagnosis Date  . Allergy   . Arthritis    BACK  . Back pain 09/05/2010  . Headache(784.0)    MIGRAINES  -- LAST ONE  09/2011  . Hyperlipidemia 07/23/2012  . Obesity (BMI 30-39.9) 10/09/2009  . Palpitations   . Pregnancy induced hypertension   . Pregnant 05/16/2015  . Vaginal spotting 05/16/2015   Past Surgical History:  Procedure Laterality Date  . CHOLECYSTECTOMY N/A 08/24/2012   Procedure: LAPAROSCOPIC CHOLECYSTECTOMY;  Surgeon: Dalia HeadingMark A Jenkins, MD;  Location: [facility];  Service: General;  Laterality: N/A;  . DILATION AND CURETTAGE OF UTERUS    . LUMBAR LAMINECTOMY/DECOMPRESSION MICRODISCECTOMY  12/03/2011   Procedure: LUMBAR LAMINECTOMY/DECOMPRESSION MICRODISCECTOMY 1 LEVEL;  Surgeon: Maeola HarmanJoseph Stern, MD;  Location: [facility];  Service: Neurosurgery;  Laterality: Left;  LEFT Lumbar four-five microdiskectomy  . TONSILLECTOMY    . WISDOM TOOTH EXTRACTION     Family History  Problem Relation Age of Onset  . Arthritis Mother   . Diabetes Mother   . Diabetes Maternal Grandmother   . Heart disease Maternal Grandmother   .  Diabetes Paternal Grandmother   . Heart disease Paternal Grandmother   . Heart attack Paternal Grandmother   . Heart disease Paternal Grandfather   . Diabetes Paternal Grandfather     Current Outpatient Medications:  .  acetaminophen (TYLENOL) 500 MG tablet, Take 1,000 mg by mouth as needed., Disp: , Rfl:  .  aspirin (ASPIRIN 81) 81 MG chewable tablet, Chew 81 mg by mouth daily., Disp: , Rfl:  .  butalbital-acetaminophen-caffeine (FIORICET) 50-325-40 MG tablet, One tablet twice daily as needed for migraine headache, Disp: 30 tablet, Rfl: 1 .  glucose blood test strip, Use as instructed, Disp: 100 each, Rfl: prn .  loratadine (CLARITIN) 10 MG tablet, Take 10 mg by mouth daily.  , Disp: , Rfl:  .  metoprolol (LOPRESSOR) 50 MG tablet, One tablet twice daily as needed, Disp: 60 tablet, Rfl: 5 .  montelukast (SINGULAIR) 10 MG tablet, TAKE 1 TABLET (10 MG TOTAL) BY MOUTH AT BEDTIME., Disp: 90 tablet, Rfl: 1 .  ONE TOUCH LANCETS MISC, Use as directed, Disp: 200 each, Rfl: prn .  pantoprazole (PROTONIX) 20 MG tablet, Take 1 tablet (20 mg total) by mouth daily., Disp: 30 tablet, Rfl: 6 .  Prenatal Vit-Fe Fumarate-FA (MULTIVITAMIN-PRENATAL) 27-0.8 MG TABS tablet, Take 1 tablet by mouth daily at 12 noon., Disp: , Rfl:  Social History: Reviewed -  reports that  has never smoked. she has never used smokeless tobacco.   Physical Assessment:   Vitals:   03/18/17 0947  BP: 126/78  Pulse: 88  Weight: 252 lb (114.3 kg)  Body mass  index is 46.09 kg/m.           Physical Examination:   General appearance: alert, well appearing, and in no distress  Mental status: alert, oriented to person, place, and time  Skin: warm & dry   Extremities: Edema: Trace    Cardiovascular: normal heart rate noted  Respiratory: normal respiratory effort, no distress  Abdomen: gravid, soft, non-tender  Pelvic: Cervical exam deferred         Fetal Status: Fetal Heart Rate (bpm): 147 Fundal Height: 34 cm Movement:  Present    Fetal Surveillance Testing today: doppler  Results for orders placed or performed in visit on 03/18/17 (from the past 24 hour(s))  POCT Urinalysis Dipstick   Collection Time: 03/18/17  9:48 AM  Result Value Ref Range   Color, UA     Clarity, UA     Glucose, UA neg    Bilirubin, UA     Ketones, UA neg    Spec Grav, UA  1.010 - 1.025   Blood, UA neg    pH, UA  5.0 - 8.0   Protein, UA neg    Urobilinogen, UA  0.2 or 1.0 E.U./dL   Nitrite, UA neg    Leukocytes, UA Negative Negative    Assessment & Plan:  1) High-risk pregnancy [id number] at 326w2d with an Estimated Date of Delivery: 05/11/17   2) A1DM, stable. No elevated FS, had a few 130's after Thanksgiving.   3) hx preeclampsia,   Labs/procedures today: none  Medications: ASA 81  Treatment Plan:  Continue QID blood sugar testing, EFW 36-38 weeks, IOL 40 weeks   Follow-up: Return in about 2 weeks (around 04/01/2017) for HROB.  Orders Placed This Encounter  Procedures  . POCT Urinalysis Dipstick   CRESENZO-DISHMAN,Brent Noto CNM 03/18/2017 10:09 AM

## 2017-04-01 ENCOUNTER — Ambulatory Visit (INDEPENDENT_AMBULATORY_CARE_PROVIDER_SITE_OTHER): Payer: 59 | Admitting: Advanced Practice Midwife

## 2017-04-01 VITALS — BP 130/82 | HR 89 | Wt 262.0 lb

## 2017-04-01 DIAGNOSIS — Z331 Pregnant state, incidental: Secondary | ICD-10-CM

## 2017-04-01 DIAGNOSIS — O0993 Supervision of high risk pregnancy, unspecified, third trimester: Secondary | ICD-10-CM

## 2017-04-01 DIAGNOSIS — Z3A34 34 weeks gestation of pregnancy: Secondary | ICD-10-CM

## 2017-04-01 DIAGNOSIS — O24410 Gestational diabetes mellitus in pregnancy, diet controlled: Secondary | ICD-10-CM | POA: Diagnosis not present

## 2017-04-01 DIAGNOSIS — Z1389 Encounter for screening for other disorder: Secondary | ICD-10-CM

## 2017-04-01 LAB — POCT URINALYSIS DIPSTICK
Blood, UA: NEGATIVE
Glucose, UA: NEGATIVE
Ketones, UA: NEGATIVE
Leukocytes, UA: NEGATIVE
Nitrite, UA: NEGATIVE
Protein, UA: NEGATIVE

## 2017-04-01 NOTE — Patient Instructions (Signed)

## 2017-04-01 NOTE — Progress Notes (Signed)
HIGH-RISK PREGNANCY VISIT Patient name: Wanda BuccoJennifer M Andrews MRN [id number]  Date of birth: [birth year] Chief Complaint:   Routine Prenatal Visit  History of Present Illness:   Wanda BuccoJennifer M Andrews is a 36 y.o. [id number] female at 6933w2d with an Estimated Date of Delivery: 05/11/17 being seen today for ongoing management of a high-risk pregnancy complicated by gestational DM, class  A1 DM.  Today she reports FBS <95 and 2 hr pp all but a few <120 (Asian food!)  Highest 160.Marland Kitchen. Contractions: Not present. Vag. Bleeding: None.  Movement: Present. denies leaking of fluid.  Review of Systems:   Pertinent items are noted in HPI Denies abnormal vaginal discharge w/ itching/odor/irritation, headaches, visual changes, shortness of breath, chest pain, abdominal pain, severe nausea/vomiting, or problems with urination or bowel movements unless otherwise stated above.    Pertinent History Reviewed:  Medical & Surgical Hx:   Past Medical History:  Diagnosis Date  . Allergy   . Arthritis    BACK  . Back pain 09/05/2010  . Headache(784.0)    MIGRAINES  -- LAST ONE  09/2011  . Hyperlipidemia 07/23/2012  . Obesity (BMI 30-39.9) 10/09/2009  . Palpitations   . Pregnancy induced hypertension   . Pregnant 05/16/2015  . Vaginal spotting 05/16/2015   Past Surgical History:  Procedure Laterality Date  . CHOLECYSTECTOMY N/A 08/24/2012   Procedure: LAPAROSCOPIC CHOLECYSTECTOMY;  Surgeon: Dalia HeadingMark A Jenkins, MD;  Location: [facility];  Service: General;  Laterality: N/A;  . DILATION AND CURETTAGE OF UTERUS    . LUMBAR LAMINECTOMY/DECOMPRESSION MICRODISCECTOMY  12/03/2011   Procedure: LUMBAR LAMINECTOMY/DECOMPRESSION MICRODISCECTOMY 1 LEVEL;  Surgeon: Maeola HarmanJoseph Stern, MD;  Location: [facility];  Service: Neurosurgery;  Laterality: Left;  LEFT Lumbar four-five microdiskectomy  . TONSILLECTOMY    . WISDOM TOOTH EXTRACTION     Family History  Problem Relation Age of Onset  . Arthritis Mother   . Diabetes Mother   . Diabetes Maternal  Grandmother   . Heart disease Maternal Grandmother   . Diabetes Paternal Grandmother   . Heart disease Paternal Grandmother   . Heart attack Paternal Grandmother   . Heart disease Paternal Grandfather   . Diabetes Paternal Grandfather     Current Outpatient Medications:  .  acetaminophen (TYLENOL) 500 MG tablet, Take 1,000 mg by mouth as needed., Disp: , Rfl:  .  aspirin (ASPIRIN 81) 81 MG chewable tablet, Chew 81 mg by mouth daily., Disp: , Rfl:  .  butalbital-acetaminophen-caffeine (FIORICET) 50-325-40 MG tablet, One tablet twice daily as needed for migraine headache, Disp: 30 tablet, Rfl: 1 .  glucose blood test strip, Use as instructed, Disp: 100 each, Rfl: prn .  loratadine (CLARITIN) 10 MG tablet, Take 10 mg by mouth daily.  , Disp: , Rfl:  .  metoprolol (LOPRESSOR) 50 MG tablet, One tablet twice daily as needed, Disp: 60 tablet, Rfl: 5 .  montelukast (SINGULAIR) 10 MG tablet, TAKE 1 TABLET (10 MG TOTAL) BY MOUTH AT BEDTIME., Disp: 90 tablet, Rfl: 1 .  ONE TOUCH LANCETS MISC, Use as directed, Disp: 200 each, Rfl: prn .  pantoprazole (PROTONIX) 20 MG tablet, Take 1 tablet (20 mg total) by mouth daily., Disp: 30 tablet, Rfl: 6 .  Prenatal Vit-Fe Fumarate-FA (MULTIVITAMIN-PRENATAL) 27-0.8 MG TABS tablet, Take 1 tablet by mouth daily at 12 noon., Disp: , Rfl:  Social History: Reviewed -  reports that  has never smoked. she has never used smokeless tobacco.   Physical Assessment:   Vitals:   04/01/17 0851  BP: 130/82  Pulse: 89  Weight: 262 lb (118.8 kg)  Body mass index is 47.92 kg/m.           Physical Examination:   General appearance: alert, well appearing, and in no distress  Mental status: alert, oriented to person, place, and time  Skin: warm & dry   Extremities: Edema: Trace    Cardiovascular: normal heart rate noted  Respiratory: normal respiratory effort, no distress  Abdomen: gravid, soft, non-tender  Pelvic: Cervical exam deferred         Fetal Status:      Movement: Present   ? PACs heard w/doppler.  EFM:  No artifact, PACs audible (dr Despina HiddenEure in to evaluate).  NST: FHR baseline 140 bpm, Variability: moderate, Accelerations:present, Decelerations:  Absent= Cat 1/Reactive  Fetal Surveillance Testing today: doppler  Results for orders placed or performed in visit on 04/01/17 (from the past 24 hour(s))  POCT Urinalysis Dipstick   Collection Time: 04/01/17  8:54 AM  Result Value Ref Range   Color, UA     Clarity, UA     Glucose, UA neg    Bilirubin, UA     Ketones, UA neg    Spec Grav, UA  1.010 - 1.025   Blood, UA neg    pH, UA  5.0 - 8.0   Protein, UA neg    Urobilinogen, UA  0.2 or 1.0 E.U./dL   Nitrite, UA neg    Leukocytes, UA Negative Negative   Appearance     Odor      Assessment & Plan:  1) High-risk pregnancy [id number] at 6360w2d with an Estimated Date of Delivery: 05/11/17   2) A1DM, stable  3) PACs, no f/u needed  Labs/procedures today: none  Medications: none  Treatment Plan:  Continue QID blood sugar testing, EFW 36-38 weeks, IOL 40 weeks   Follow-up: Return in about 1 week (around 04/08/2017) for HROB.  Orders Placed This Encounter  Procedures  . POCT Urinalysis Dipstick   CRESENZO-DISHMAN,Benedicta Sultan CNM 04/01/2017 9:59 AM

## 2017-04-06 ENCOUNTER — Encounter (HOSPITAL_COMMUNITY): Payer: Self-pay

## 2017-04-06 ENCOUNTER — Inpatient Hospital Stay (HOSPITAL_COMMUNITY)
Admission: AD | Admit: 2017-04-06 | Discharge: 2017-04-07 | Disposition: A | Discharge status: Home / Self Care | Payer: 59 | Source: Ambulatory Visit | Attending: Obstetrics & Gynecology | Admitting: Obstetrics & Gynecology

## 2017-04-06 DIAGNOSIS — Z3A35 35 weeks gestation of pregnancy: Secondary | ICD-10-CM | POA: Insufficient documentation

## 2017-04-06 DIAGNOSIS — O99283 Endocrine, nutritional and metabolic diseases complicating pregnancy, third trimester: Secondary | ICD-10-CM | POA: Diagnosis not present

## 2017-04-06 DIAGNOSIS — O99213 Obesity complicating pregnancy, third trimester: Secondary | ICD-10-CM | POA: Insufficient documentation

## 2017-04-06 DIAGNOSIS — O24410 Gestational diabetes mellitus in pregnancy, diet controlled: Secondary | ICD-10-CM | POA: Diagnosis not present

## 2017-04-06 DIAGNOSIS — O09893 Supervision of other high risk pregnancies, third trimester: Secondary | ICD-10-CM | POA: Insufficient documentation

## 2017-04-06 DIAGNOSIS — R51 Headache: Secondary | ICD-10-CM | POA: Diagnosis not present

## 2017-04-06 DIAGNOSIS — H538 Other visual disturbances: Secondary | ICD-10-CM | POA: Insufficient documentation

## 2017-04-06 DIAGNOSIS — Z7982 Long term (current) use of aspirin: Secondary | ICD-10-CM | POA: Insufficient documentation

## 2017-04-06 DIAGNOSIS — O133 Gestational [pregnancy-induced] hypertension without significant proteinuria, third trimester: Secondary | ICD-10-CM | POA: Insufficient documentation

## 2017-04-06 DIAGNOSIS — Z79899 Other long term (current) drug therapy: Secondary | ICD-10-CM | POA: Insufficient documentation

## 2017-04-06 DIAGNOSIS — O26893 Other specified pregnancy related conditions, third trimester: Secondary | ICD-10-CM | POA: Insufficient documentation

## 2017-04-06 DIAGNOSIS — O0990 Supervision of high risk pregnancy, unspecified, unspecified trimester: Secondary | ICD-10-CM

## 2017-04-06 LAB — COMPREHENSIVE METABOLIC PANEL
ALT: 11 U/L — ABNORMAL LOW (ref 14–54)
AST: 19 U/L (ref 15–41)
Albumin: 2.3 g/dL — ABNORMAL LOW (ref 3.5–5.0)
Alkaline Phosphatase: 187 U/L — ABNORMAL HIGH (ref 38–126)
Anion gap: 13 (ref 5–15)
BUN: 15 mg/dL (ref 6–20)
CO2: 17 mmol/L — ABNORMAL LOW (ref 22–32)
Calcium: 8.4 mg/dL — ABNORMAL LOW (ref 8.9–10.3)
Chloride: 105 mmol/L (ref 101–111)
Creatinine, Ser: 0.49 mg/dL (ref 0.44–1.00)
GFR calc Af Amer: >60 mL/min (ref >60)
GFR calc non Af Amer: >60 mL/min (ref >60)
Glucose, Bld: 95 mg/dL (ref 65–99)
Potassium: 4.4 mmol/L (ref 3.5–5.1)
Sodium: 135 mmol/L (ref 135–145)
Total Bilirubin: 0.6 mg/dL (ref 0.3–1.2)
Total Protein: 5.6 g/dL — ABNORMAL LOW (ref 6.5–8.1)

## 2017-04-06 LAB — URINALYSIS, DIPSTICK ONLY
Bilirubin Urine: NEGATIVE
Glucose, UA: NEGATIVE mg/dL
Hgb urine dipstick: NEGATIVE
Ketones, ur: NEGATIVE mg/dL
Nitrite: NEGATIVE
Protein, ur: 30 mg/dL — AB
Specific Gravity, Urine: 1.024 (ref 1.005–1.030)
pH: 5 (ref 5.0–8.0)

## 2017-04-06 LAB — CBC
HCT: 35 % — ABNORMAL LOW (ref 36.0–46.0)
Hemoglobin: 11.3 g/dL — ABNORMAL LOW (ref 12.0–15.0)
MCH: 27.3 pg (ref 26.0–34.0)
MCHC: 32.3 g/dL (ref 30.0–36.0)
MCV: 84.5 fL (ref 78.0–100.0)
Platelets: 305 10*3/uL (ref 150–400)
RBC: 4.14 MIL/uL (ref 3.87–5.11)
RDW: 14 % (ref 11.5–15.5)
WBC: 11.5 10*3/uL — ABNORMAL HIGH (ref 4.0–10.5)

## 2017-04-06 LAB — PROTEIN / CREATININE RATIO, URINE
Creatinine, Urine: 136 mg/dL
Protein Creatinine Ratio: 0.21 mg/mg{Cre} — ABNORMAL HIGH (ref 0.00–0.15)
Total Protein, Urine: 28 mg/dL

## 2017-04-06 NOTE — MAU Note (Signed)
Pt reporting headache since this afternoon. Blurry vision since yesterday. Took BP at work, was 157/102.  No LOF, no bleeding. + fetal movement.

## 2017-04-07 DIAGNOSIS — O24410 Gestational diabetes mellitus in pregnancy, diet controlled: Secondary | ICD-10-CM | POA: Diagnosis not present

## 2017-04-07 DIAGNOSIS — Z7982 Long term (current) use of aspirin: Secondary | ICD-10-CM | POA: Diagnosis not present

## 2017-04-07 DIAGNOSIS — R51 Headache: Secondary | ICD-10-CM | POA: Diagnosis not present

## 2017-04-07 DIAGNOSIS — O99283 Endocrine, nutritional and metabolic diseases complicating pregnancy, third trimester: Secondary | ICD-10-CM | POA: Diagnosis not present

## 2017-04-07 DIAGNOSIS — O99213 Obesity complicating pregnancy, third trimester: Secondary | ICD-10-CM | POA: Diagnosis not present

## 2017-04-07 DIAGNOSIS — O09893 Supervision of other high risk pregnancies, third trimester: Secondary | ICD-10-CM | POA: Diagnosis not present

## 2017-04-07 DIAGNOSIS — H538 Other visual disturbances: Secondary | ICD-10-CM | POA: Diagnosis not present

## 2017-04-07 DIAGNOSIS — Z3482 Encounter for supervision of other normal pregnancy, second trimester: Secondary | ICD-10-CM | POA: Diagnosis not present

## 2017-04-07 DIAGNOSIS — Z3A35 35 weeks gestation of pregnancy: Secondary | ICD-10-CM | POA: Diagnosis not present

## 2017-04-07 DIAGNOSIS — O133 Gestational [pregnancy-induced] hypertension without significant proteinuria, third trimester: Secondary | ICD-10-CM | POA: Diagnosis not present

## 2017-04-07 DIAGNOSIS — Z3483 Encounter for supervision of other normal pregnancy, third trimester: Secondary | ICD-10-CM | POA: Diagnosis not present

## 2017-04-07 DIAGNOSIS — O26893 Other specified pregnancy related conditions, third trimester: Secondary | ICD-10-CM | POA: Diagnosis not present

## 2017-04-07 MED ORDER — BETAMETHASONE SOD PHOS & ACET 6 (3-3) MG/ML IJ SUSP
12.0000 mg | INTRAMUSCULAR | Status: DC
  Administered 2017-04-07: 12 mg via INTRAMUSCULAR
  Filled 2017-04-07: qty 2

## 2017-04-07 MED ORDER — LABETALOL HCL 200 MG PO TABS: 200.0000 mg | ORAL_TABLET | Freq: Three times a day (TID) | ORAL | 0 refills | Status: DC

## 2017-04-07 MED ORDER — BUTALBITAL-APAP-CAFFEINE 50-325-40 MG PO TABS
2.0000 | ORAL_TABLET | Freq: Once | ORAL | Status: AC
  Administered 2017-04-07: 2 via ORAL
  Filled 2017-04-07: qty 2

## 2017-04-07 NOTE — MAU Provider Note (Signed)
History     CSN: [id number]  Arrival date and time: 04/06/17 2237   None     Chief Complaint  Patient presents with  . Headache  . Blurred Vision   HPI [id number] Estimated Date of Delivery: 05/11/17 4350w1d with complaint of headache which started at about 1800 which did not respond to tylenol.  Her BP has been up a bit over the past few days or weeks, in the 130/80-140/90 range.  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  I was called by the nursing supervisor and instructed her to come down to the MAU for evaluation.  Here her BP was in the sam range and her labs are normal with Pr/Cr 0.21  Reactive NST   Past Medical History:  Diagnosis Date  . Allergy   . Arthritis    BACK  . Back pain 09/05/2010  . Headache(784.0)    MIGRAINES  -- LAST ONE  09/2011  . Hyperlipidemia 07/23/2012  . Obesity (BMI 30-39.9) 10/09/2009  . Palpitations   . Pregnancy induced hypertension   . Pregnant 05/16/2015  . Vaginal spotting 05/16/2015    Past Surgical History:  Procedure Laterality Date  . CHOLECYSTECTOMY N/A 08/24/2012   Procedure: LAPAROSCOPIC CHOLECYSTECTOMY;  Surgeon: Dalia HeadingMark A Jenkins, MD;  Location: [facility];  Service: General;  Laterality: N/A;  . DILATION AND CURETTAGE OF UTERUS    . LUMBAR LAMINECTOMY/DECOMPRESSION MICRODISCECTOMY  12/03/2011   Procedure: LUMBAR LAMINECTOMY/DECOMPRESSION MICRODISCECTOMY 1 LEVEL;  Surgeon: Maeola HarmanJoseph Stern, MD;  Location: [facility];  Service: Neurosurgery;  Laterality: Left;  LEFT Lumbar four-five microdiskectomy  . TONSILLECTOMY    . WISDOM TOOTH EXTRACTION      Family History  Problem Relation Age of Onset  . Arthritis Mother   . Diabetes Mother   . Diabetes Maternal Grandmother   . Heart disease Maternal Grandmother   . Diabetes Paternal Grandmother   . Heart disease Paternal Grandmother   . Heart attack Paternal Grandmother   . Heart disease Paternal Grandfather   .  Diabetes Paternal Grandfather     Social History   Tobacco Use  . Smoking status: Never Smoker  . Smokeless tobacco: Never Used  Substance Use Topics  . Alcohol use: No  . Drug use: No    Allergies:  Allergies  Allergen Reactions  . Shellfish Allergy Anaphylaxis  . Cetirizine Hcl   . Chocolate Hives    Medications Prior to Admission  Medication Sig Dispense Refill Last Dose  . acetaminophen (TYLENOL) 500 MG tablet Take 1,000 mg by mouth as needed.   04/06/2017 at Unknown time  . aspirin (ASPIRIN 81) 81 MG chewable tablet Chew 81 mg by mouth daily.   04/06/2017 at Unknown time  . loratadine (CLARITIN) 10 MG tablet Take 10 mg by mouth daily.     04/06/2017 at Unknown time  . montelukast (SINGULAIR) 10 MG tablet TAKE 1 TABLET (10 MG TOTAL) BY MOUTH AT BEDTIME. 90 tablet 1 04/05/2017 at Unknown time  . pantoprazole (PROTONIX) 20 MG tablet Take 1 tablet (20 mg total) by mouth daily. 30 tablet 6 04/06/2017 at Unknown time  . Prenatal Vit-Fe Fumarate-FA (MULTIVITAMIN-PRENATAL) 27-0.8 MG TABS tablet Take 1 tablet by mouth daily at 12 noon.   04/06/2017 at Unknown time  . butalbital-acetaminophen-caffeine (FIORICET) 50-325-40 MG tablet One tablet twice daily as needed for migraine headache 30 tablet 1 More than a month at Unknown  time  . glucose blood test strip Use as instructed 100 each prn Taking  . metoprolol (LOPRESSOR) 50 MG tablet One tablet twice daily as needed 60 tablet 5 More than a month at Unknown time  . ONE TOUCH LANCETS MISC Use as directed 200 each prn Taking    Review of Systems  Per HPI Physical Exam   Blood pressure (!) 147/105, pulse 75, temperature 98.5 F (36.9 C), resp. rate 18, last menstrual period 08/04/2016.  Physical Exam Abdomen soft non tender Extremities 2+ edema DTR 2+ no clonus  Reactive NST  Results for orders placed or performed during the hospital encounter of 04/06/17 (from the past 168 hour(s))  Protein / creatinine ratio, urine    Collection Time: 04/06/17 10:35 PM  Result Value Ref Range   Creatinine, Urine 136.00 mg/dL   Total Protein, Urine 28 mg/dL   Protein Creatinine Ratio 0.21 (H) 0.00 - 0.15 mg/mg[Cre]  Urinalysis, dipstick only   Collection Time: 04/06/17 10:35 PM  Result Value Ref Range   Color, Urine YELLOW YELLOW   APPearance CLOUDY (A) CLEAR   Specific Gravity, Urine 1.024 1.005 - 1.030   pH 5.0 5.0 - 8.0   Glucose, UA NEGATIVE NEGATIVE mg/dL   Hgb urine dipstick NEGATIVE NEGATIVE   Bilirubin Urine NEGATIVE NEGATIVE   Ketones, ur NEGATIVE NEGATIVE mg/dL   Protein, ur 30 (A) NEGATIVE mg/dL   Nitrite NEGATIVE NEGATIVE   Leukocytes, UA LARGE (A) NEGATIVE  CBC   Collection Time: 04/06/17 11:05 PM  Result Value Ref Range   WBC 11.5 (H) 4.0 - 10.5 K/uL   RBC 4.14 3.87 - 5.11 MIL/uL   Hemoglobin 11.3 (L) 12.0 - 15.0 g/dL   HCT 16.1 (L) 09.6 - 04.5 %   MCV 84.5 78.0 - 100.0 fL   MCH 27.3 26.0 - 34.0 pg   MCHC 32.3 30.0 - 36.0 g/dL   RDW 40.9 81.1 - 91.4 %   Platelets 305 150 - 400 K/uL  Comprehensive metabolic panel   Collection Time: 04/06/17 11:05 PM  Result Value Ref Range   Sodium 135 135 - 145 mmol/L   Potassium 4.4 3.5 - 5.1 mmol/L   Chloride 105 101 - 111 mmol/L   CO2 17 (L) 22 - 32 mmol/L   Glucose, Bld 95 65 - 99 mg/dL   BUN 15 6 - 20 mg/dL   Creatinine, Ser 7.82 0.44 - 1.00 mg/dL   Calcium 8.4 (L) 8.9 - 10.3 mg/dL   Total Protein 5.6 (L) 6.5 - 8.1 g/dL   Albumin 2.3 (L) 3.5 - 5.0 g/dL   AST 19 15 - 41 U/L   ALT 11 (L) 14 - 54 U/L   Alkaline Phosphatase 187 (H) 38 - 126 U/L   Total Bilirubin 0.6 0.3 - 1.2 mg/dL   GFR calc non Af Amer >60 >60 mL/min   GFR calc Af Amer >60 >60 mL/min   Anion gap 13 5 - 15  Results for orders placed or performed in visit on 04/01/17 (from the past 168 hour(s))  POCT Urinalysis Dipstick   Collection Time: 04/01/17  8:54 AM  Result Value Ref Range   Color, UA     Clarity, UA     Glucose, UA neg    Bilirubin, UA     Ketones, UA neg    Spec  Grav, UA  1.010 - 1.025   Blood, UA neg    pH, UA  5.0 - 8.0   Protein, UA neg  Urobilinogen, UA  0.2 or 1.0 E.U./dL   Nitrite, UA neg    Leukocytes, UA Negative Negative   Appearance     Odor      MAU Course  Procedures  MDM   Assessment and Plan  6868w1d with gestational hypertension  Betamethasone 12 mg IM given Pt to check her BP 4 times daily at home Begin labetalol 200 TID Ob visit with NST on Wednesday  Lazaro ArmsLuther H Niquan Charnley 04/07/2017, 12:54 AM

## 2017-04-07 NOTE — Discharge Instructions (Signed)

## 2017-04-09 ENCOUNTER — Ambulatory Visit (INDEPENDENT_AMBULATORY_CARE_PROVIDER_SITE_OTHER): Payer: 59 | Admitting: Women's Health

## 2017-04-09 ENCOUNTER — Encounter: Payer: Self-pay | Admitting: Women's Health

## 2017-04-09 VITALS — BP 144/86 | HR 80 | Wt 263.0 lb

## 2017-04-09 DIAGNOSIS — O24410 Gestational diabetes mellitus in pregnancy, diet controlled: Secondary | ICD-10-CM

## 2017-04-09 DIAGNOSIS — Z3A35 35 weeks gestation of pregnancy: Secondary | ICD-10-CM

## 2017-04-09 DIAGNOSIS — O0990 Supervision of high risk pregnancy, unspecified, unspecified trimester: Secondary | ICD-10-CM

## 2017-04-09 DIAGNOSIS — O133 Gestational [pregnancy-induced] hypertension without significant proteinuria, third trimester: Secondary | ICD-10-CM | POA: Insufficient documentation

## 2017-04-09 DIAGNOSIS — Z331 Pregnant state, incidental: Secondary | ICD-10-CM

## 2017-04-09 DIAGNOSIS — Z8759 Personal history of other complications of pregnancy, childbirth and the puerperium: Secondary | ICD-10-CM | POA: Diagnosis not present

## 2017-04-09 DIAGNOSIS — O0993 Supervision of high risk pregnancy, unspecified, third trimester: Secondary | ICD-10-CM

## 2017-04-09 DIAGNOSIS — Z1389 Encounter for screening for other disorder: Secondary | ICD-10-CM

## 2017-04-09 LAB — POCT URINALYSIS DIPSTICK
Blood, UA: NEGATIVE
Glucose, UA: NEGATIVE
Ketones, UA: NEGATIVE
Nitrite, UA: NEGATIVE
Protein, UA: NEGATIVE

## 2017-04-09 MED ORDER — BETAMETHASONE SOD PHOS & ACET 6 (3-3) MG/ML IJ SUSP
12.0000 mg | Freq: Once | INTRAMUSCULAR | Status: AC
  Administered 2017-04-09: 12 mg via INTRAMUSCULAR

## 2017-04-09 NOTE — Patient Instructions (Signed)
[name] [name], I greatly value your feedback.  If you receive a survey following your visit with us today, we appreciate you taking the time to fill it out.  Thanks, [name], CNM, WHNP-BC   Call the office [phone]) or go to [facility] if:  You begin to have strong, frequent contractions  Your water breaks.  Sometimes it is a big gush of fluid, sometimes it is just a trickle that keeps getting your panties wet or running down your legs  You have vaginal bleeding.  It is normal to have a small amount of spotting if your cervix was checked.   You don't feel your baby moving like normal.  If you don't, get you something to eat and drink and lay down and focus on feeling your baby move.  You should feel at least 10 movements in 2 hours.  If you don't, you should call the office or go to [facility].    Call the office [phone]) or go to [facility] for these signs of pre-eclampsia:  Severe headache that does not go away with Tylenol  Visual changes- seeing spots, double, blurred vision  Pain under your right breast or upper abdomen that does not go away with Tums or heartburn medicine  Nausea and/or vomiting  Severe swelling in your hands, feet, and face      Preterm Labor and Birth Information The normal length of a pregnancy is 39-41 weeks. Preterm labor is when labor starts before 37 completed weeks of pregnancy. What are the risk factors for preterm labor? Preterm labor is more likely to occur in women who:  Have certain infections during pregnancy such as a bladder infection, sexually transmitted infection, or infection inside the uterus (chorioamnionitis).  Have a shorter-than-normal cervix.  Have gone into preterm labor before.  Have had surgery on their cervix.  Are younger than age 34 or older than age 34.  Are African American.  Are pregnant with twins or multiple babies (multiple gestation).  Take street drugs or smoke while  pregnant.  Do not gain enough weight while pregnant.  Became pregnant shortly after having been pregnant.  What are the symptoms of preterm labor? Symptoms of preterm labor include:  Cramps similar to those that can happen during a menstrual period. The cramps may happen with diarrhea.  Pain in the abdomen or lower back.  Regular uterine contractions that may feel like tightening of the abdomen.  A feeling of increased pressure in the pelvis.  Increased watery or bloody mucus discharge from the vagina.  Water breaking (ruptured amniotic sac).  Why is it important to recognize signs of preterm labor? It is important to recognize signs of preterm labor because babies who are born prematurely may not be fully developed. This can put them at an increased risk for:  Long-term (chronic) heart and lung problems.  Difficulty immediately after birth with regulating body systems, including blood sugar, body temperature, heart rate, and breathing rate.  Bleeding in the brain.  Cerebral palsy.  Learning difficulties.  Death.  These risks are highest for babies who are born before 34 weeks of pregnancy. How is preterm labor treated? Treatment depends on the length of your pregnancy, your condition, and the health of your baby. It may involve:  Having a stitch (suture) placed in your cervix to prevent your cervix from opening too early (cerclage).  Taking or being given medicines, such as: ? Hormone medicines. These may be given early in pregnancy to help  support the pregnancy. ? Medicine to stop contractions. ? Medicines to help mature the baby's lungs. These may be prescribed if the risk of delivery is high. ? Medicines to prevent your baby from developing cerebral palsy.  If the labor happens before 34 weeks of pregnancy, you may need to stay in the hospital. What should I do if I think I am in preterm labor? If you think that you are going into preterm labor, call your health  care provider right away. How can I prevent preterm labor in future pregnancies? To increase your chance of having a full-term pregnancy:  Do not use any tobacco products, such as cigarettes, chewing tobacco, and e-cigarettes. If you need help quitting, ask your health care provider.  Do not use street drugs or medicines that have not been prescribed to you during your pregnancy.  Talk with your health care provider before taking any herbal supplements, even if you have been taking them regularly.  Make sure you gain a healthy amount of weight during your pregnancy.  Watch for infection. If you think that you might have an infection, get it checked right away.  Make sure to tell your health care provider if you have gone into preterm labor before.  This information is not intended to replace advice given to you by your health care provider. Make sure you discuss any questions you have with your health care provider. Document Released: 06/22/2003 Document Revised: 09/12/2015 Document Reviewed: 08/23/2015 Elsevier Interactive Patient Education  2018 [employer].

## 2017-04-09 NOTE — Progress Notes (Signed)
HIGH-RISK PREGNANCY VISIT Patient name: Wanda BuccoJennifer M Andrews MRN [id number]  Date of birth: May 16, 1980 Chief Complaint:   Routine Prenatal Visit  History of Present Illness:   Wanda BuccoJennifer M Andrews is a 36 y.o. [id number] female at 6919w3d with an Estimated Date of Delivery: 05/11/17 being seen today for ongoing management of a high-risk pregnancy complicated by A1DM, GHTN on Labetalol 200mg  TID Today she reports went to [facility] 12/23 pm d/t elevated bp/ha, was dx w/ GHTN- given rx for Labetalol 200mg  TID, pre-e labs were normal, received BMZ x 1, was told to get 2nd BMZ today. 1 elevated FBS s/p BMZ, most 2hr pp <120. Reports headache yesterday on Christmas day, went away w/ APAP. Some blurred vision. Denies ruq/epigastric pain, n/v. Is out of work now [employer]t GHT[employer].  Contractions: Not present. Vag. Bleeding: None.  Movement: Present. denies leaking of fluid.  Review of Systems:   Pertinent items are noted in HPI Denies abnormal vaginal discharge w/ itching/odor/irritation, headaches, visual changes, shortness of breath, chest pain, abdominal pain, severe nausea/vomiting, or problems with urination or bowel movements unless otherwise stated above. Pertinent History Reviewed:  Reviewed past medical,surgical, social, obstetrical and family history.  Reviewed problem list, medications and allergies. Physical Assessment:   Vitals:   04/09/17 1018  BP: (!) 144/86  Pulse: 80  Weight: 263 lb (119.3 kg)  Body mass index is 48.1 kg/m.           Physical Examination:   General appearance: alert, well appearing, and in no distress  Mental status: alert, oriented to person, place, and time  Skin: warm & dry   Extremities: Edema: Trace to 1+, DTRs 2+, no clonus   Cardiovascular: normal heart rate noted  Respiratory: normal respiratory effort, no distress  Abdomen: gravid, soft, non-tender  Pelvic: Cervical exam deferred         Fetal Status: Fetal Heart Rate (bpm): 150 Fundal Height: 37 cm Movement: Present     Fetal Surveillance Testing today: NST: FHR baseline 150 bpm, Variability: moderate, Accelerations:present, Decelerations:  Absent= Cat 1/Reactive     Results for orders placed or performed in visit on 04/09/17 (from the past 24 hour(s))  POCT urinalysis dipstick   Collection Time: 04/09/17 10:31 AM  Result Value Ref Range   Color, UA     Clarity, UA     Glucose, UA neg    Bilirubin, UA     Ketones, UA neg    Spec Grav, UA  1.010 - 1.025   Blood, UA neg    pH, UA  5.0 - 8.0   Protein, UA neg    Urobilinogen, UA  0.2 or 1.0 E.U./dL   Nitrite, UA neg    Leukocytes, UA Moderate (2+) (A) Negative   Appearance     Odor      Assessment & Plan:  1) High-risk pregnancy [id number] at 1819w3d with an Estimated Date of Delivery: 05/11/17   2) A1DM, stable, will continue to monitor s/p BMZ  3) GHTN, stable, continue Labetalol 200mg  TID, 2nd BMZ today, IOL @ 37wks. Reviewed pre-e warning s/s to report/reasons to see care.   4) H/O shoulder dystocia> EFW next week  Labs/procedures today: nst, 2nd BMZ  Treatment Plan:  2x/wk testing nst alt w/ bpp/dopp, efw next week, IOL @ 37wks  Reviewed: Preterm labor symptoms and general obstetric precautions including but not limited to vaginal bleeding, contractions, leaking of fluid and fetal movement were reviewed in detail with the patient.  All questions were answered.  Follow-up: Return for Friday for HROB/NST, then Tues for HROB, bpp/dopp/efw u/s, then Friday for HROB/NST.  Orders Placed This Encounter  Procedures  . POCT urinalysis dipstick   [name], Kallie Depolo Randall CNM, [facility] 04/09/2017 11:23 AM

## 2017-04-11 ENCOUNTER — Ambulatory Visit (INDEPENDENT_AMBULATORY_CARE_PROVIDER_SITE_OTHER): Payer: 59 | Admitting: Obstetrics and Gynecology

## 2017-04-11 ENCOUNTER — Encounter: Payer: Self-pay | Admitting: Obstetrics and Gynecology

## 2017-04-11 ENCOUNTER — Telehealth (HOSPITAL_COMMUNITY): Payer: Self-pay | Admitting: *Deleted

## 2017-04-11 VITALS — BP 130/90 | HR 98 | Wt 263.0 lb

## 2017-04-11 DIAGNOSIS — O133 Gestational [pregnancy-induced] hypertension without significant proteinuria, third trimester: Secondary | ICD-10-CM | POA: Diagnosis not present

## 2017-04-11 DIAGNOSIS — Z331 Pregnant state, incidental: Secondary | ICD-10-CM

## 2017-04-11 DIAGNOSIS — Z1389 Encounter for screening for other disorder: Secondary | ICD-10-CM

## 2017-04-11 DIAGNOSIS — Z3A35 35 weeks gestation of pregnancy: Secondary | ICD-10-CM

## 2017-04-11 DIAGNOSIS — O24410 Gestational diabetes mellitus in pregnancy, diet controlled: Secondary | ICD-10-CM

## 2017-04-11 DIAGNOSIS — O0990 Supervision of high risk pregnancy, unspecified, unspecified trimester: Secondary | ICD-10-CM

## 2017-04-11 LAB — POCT URINALYSIS DIPSTICK
Blood, UA: NEGATIVE
Glucose, UA: NEGATIVE
Ketones, UA: NEGATIVE
Leukocytes, UA: NEGATIVE
Nitrite, UA: NEGATIVE
Protein, UA: NEGATIVE

## 2017-04-11 NOTE — Telephone Encounter (Signed)
Preadmission screen  

## 2017-04-11 NOTE — Progress Notes (Signed)
HIGH-RISK PREGNANCY VISIT Patient name: Wanda BuccoJennifer M Andrews MRN [id number]  Date of birth: May 31, 1980 Chief Complaint:   High Risk Gestation (NST/ room # 8)  History of Present Illness:   Wanda BuccoJennifer M Spain is a 36 y.o. [id number] female at 4773w5d with an Estimated Date of Delivery: 05/11/17 being seen today for ongoing management of a high-risk pregnancy complicated by gestational HTN, class  A1 DM.  Today she reports no complaints. Contractions: Not present.  .  Movement: Present. denies leaking of fluid, headaches, blurry visions Review of Systems:   Pertinent items are noted in HPI Denies abnormal vaginal discharge w/ itching/odor/irritation, headaches, visual changes, shortness of breath, chest pain, abdominal pain, severe nausea/vomiting, or problems with urination or bowel movements unless otherwise stated above. Pertinent History Reviewed:  Reviewed past medical,surgical, social, obstetrical and family history.  Reviewed problem list, medications and allergies. Physical Assessment:   Vitals:   04/11/17 0922  BP: 130/90  Pulse: 98  Weight: 263 lb (119.3 kg)  Body mass index is 48.1 kg/m.           Physical Examination:   General appearance: alert, well appearing, and in no distress  Mental status: alert, oriented to person, place, and time  Skin: warm & dry   Extremities: Edema: Trace    Cardiovascular: normal heart rate noted  Respiratory: normal respiratory effort, no distress  Abdomen: gravid, soft, non-tender  Pelvic: Cervical exam deferred         Fetal Status:     Movement: Present  FHR: 130 reactive to 155 bpm FH:31 cm  Fetal Surveillance Testing today: NST- reactive   Results for orders placed or performed in visit on 04/11/17 (from the past 24 hour(s))  POCT urinalysis dipstick   Collection Time: 04/11/17  9:36 AM  Result Value Ref Range   Color, UA     Clarity, UA     Glucose, UA neg    Bilirubin, UA     Ketones, UA neg    Spec Grav, UA  1.010 - 1.025   Blood,  UA neg    pH, UA  5.0 - 8.0   Protein, UA neg    Urobilinogen, UA  0.2 or 1.0 E.U./dL   Nitrite, UA neg    Leukocytes, UA Negative Negative   Appearance     Odor      Assessment & Plan:  1) High-risk pregnancy [id number] at 2773w5d with an Estimated Date of Delivery: 05/11/17   2) A1DM, stable  3) GHTN, stable IOL @ 37 wks POSTED to  04/20/17 at midnight Needs GBS 4) H/o shoulder dystocia >EFW next week  Labs/procedures today: NST reactive   Treatment Plan:  2x/wk testing nst alt w/ bpp/dopp, efw next week, IOL @ 37wks [facility] and [facility]   Reviewed: Preterm labor symptoms and general obstetric precautions including but not limited to vaginal bleeding, contractions, leaking of fluid and fetal movement were reviewed in detail with the patient.  All questions were answered.  Follow-up: Return in about 3 days (around 04/14/2017) for HROB, NST. bpp next thursday  Orders Placed This Encounter  Procedures   POCT urinalysis dipstick     By signing my name below, I, Izna Ahmed, attest that this documentation has been prepared under the direction and in the presence of Tilda BurrowFerguson, John V, MD. Electronically Signed: Redge GainerIzna Ahmed, Medical Scribe. 04/11/17. 10:29 AM.  I personally performed the services described in this documentation, which was SCRIBED in my presence. The  recorded information has been reviewed and considered accurate. It has been edited as necessary during review. Jonnie Kind, MD

## 2017-04-14 ENCOUNTER — Ambulatory Visit (INDEPENDENT_AMBULATORY_CARE_PROVIDER_SITE_OTHER): Payer: 59 | Admitting: Women's Health

## 2017-04-14 ENCOUNTER — Inpatient Hospital Stay (HOSPITAL_COMMUNITY)
Admission: AD | Admit: 2017-04-14 | Discharge: 2017-04-17 | DRG: 807 | Disposition: A | Discharge status: Home / Self Care | Payer: 59 | Source: Ambulatory Visit | Attending: Family Medicine | Admitting: Family Medicine

## 2017-04-14 ENCOUNTER — Inpatient Hospital Stay (HOSPITAL_COMMUNITY): Payer: 59

## 2017-04-14 ENCOUNTER — Other Ambulatory Visit: Payer: Self-pay

## 2017-04-14 ENCOUNTER — Encounter: Payer: Self-pay | Admitting: Women's Health

## 2017-04-14 ENCOUNTER — Encounter (HOSPITAL_COMMUNITY): Payer: Self-pay

## 2017-04-14 VITALS — BP 164/106 | HR 102 | Wt 259.4 lb

## 2017-04-14 DIAGNOSIS — Z331 Pregnant state, incidental: Secondary | ICD-10-CM

## 2017-04-14 DIAGNOSIS — O24410 Gestational diabetes mellitus in pregnancy, diet controlled: Secondary | ICD-10-CM

## 2017-04-14 DIAGNOSIS — O0993 Supervision of high risk pregnancy, unspecified, third trimester: Secondary | ICD-10-CM

## 2017-04-14 DIAGNOSIS — Z7982 Long term (current) use of aspirin: Secondary | ICD-10-CM | POA: Diagnosis not present

## 2017-04-14 DIAGNOSIS — O1413 Severe pre-eclampsia, third trimester: Secondary | ICD-10-CM | POA: Diagnosis not present

## 2017-04-14 DIAGNOSIS — O164 Unspecified maternal hypertension, complicating childbirth: Secondary | ICD-10-CM | POA: Diagnosis not present

## 2017-04-14 DIAGNOSIS — Z3A36 36 weeks gestation of pregnancy: Secondary | ICD-10-CM | POA: Diagnosis not present

## 2017-04-14 DIAGNOSIS — Z3483 Encounter for supervision of other normal pregnancy, third trimester: Secondary | ICD-10-CM | POA: Diagnosis not present

## 2017-04-14 DIAGNOSIS — O0990 Supervision of high risk pregnancy, unspecified, unspecified trimester: Secondary | ICD-10-CM

## 2017-04-14 DIAGNOSIS — O1410 Severe pre-eclampsia, unspecified trimester: Secondary | ICD-10-CM | POA: Diagnosis present

## 2017-04-14 DIAGNOSIS — O24419 Gestational diabetes mellitus in pregnancy, unspecified control: Secondary | ICD-10-CM

## 2017-04-14 DIAGNOSIS — Z3689 Encounter for other specified antenatal screening: Secondary | ICD-10-CM

## 2017-04-14 DIAGNOSIS — O10919 Unspecified pre-existing hypertension complicating pregnancy, unspecified trimester: Secondary | ICD-10-CM

## 2017-04-14 DIAGNOSIS — O99210 Obesity complicating pregnancy, unspecified trimester: Secondary | ICD-10-CM

## 2017-04-14 DIAGNOSIS — O133 Gestational [pregnancy-induced] hypertension without significant proteinuria, third trimester: Secondary | ICD-10-CM

## 2017-04-14 DIAGNOSIS — Z8759 Personal history of other complications of pregnancy, childbirth and the puerperium: Secondary | ICD-10-CM | POA: Diagnosis not present

## 2017-04-14 DIAGNOSIS — O24420 Gestational diabetes mellitus in childbirth, diet controlled: Secondary | ICD-10-CM | POA: Diagnosis present

## 2017-04-14 DIAGNOSIS — O165 Unspecified maternal hypertension, complicating the puerperium: Secondary | ICD-10-CM | POA: Diagnosis not present

## 2017-04-14 DIAGNOSIS — O1414 Severe pre-eclampsia complicating childbirth: Secondary | ICD-10-CM | POA: Diagnosis not present

## 2017-04-14 DIAGNOSIS — O99214 Obesity complicating childbirth: Secondary | ICD-10-CM | POA: Diagnosis present

## 2017-04-14 DIAGNOSIS — Z1389 Encounter for screening for other disorder: Secondary | ICD-10-CM

## 2017-04-14 LAB — GLUCOSE, CAPILLARY
Glucose-Capillary: 69 mg/dL (ref 65–99)
Glucose-Capillary: 103 mg/dL — ABNORMAL HIGH (ref 65–99)
Glucose-Capillary: 106 mg/dL — ABNORMAL HIGH (ref 65–99)

## 2017-04-14 LAB — CBC
HCT: 34.9 % — ABNORMAL LOW (ref 36.0–46.0)
Hemoglobin: 11.4 g/dL — ABNORMAL LOW (ref 12.0–15.0)
MCH: 27.5 pg (ref 26.0–34.0)
MCHC: 32.7 g/dL (ref 30.0–36.0)
MCV: 84.1 fL (ref 78.0–100.0)
Platelets: 289 10*3/uL (ref 150–400)
RBC: 4.15 MIL/uL (ref 3.87–5.11)
RDW: 14.3 % (ref 11.5–15.5)
WBC: 15.9 10*3/uL — ABNORMAL HIGH (ref 4.0–10.5)

## 2017-04-14 LAB — COMPREHENSIVE METABOLIC PANEL
ALT: 10 U/L — ABNORMAL LOW (ref 14–54)
AST: 20 U/L (ref 15–41)
Albumin: 2.5 g/dL — ABNORMAL LOW (ref 3.5–5.0)
Alkaline Phosphatase: 184 U/L — ABNORMAL HIGH (ref 38–126)
Anion gap: 11 (ref 5–15)
BUN: 13 mg/dL (ref 6–20)
CO2: 18 mmol/L — ABNORMAL LOW (ref 22–32)
Calcium: 8.6 mg/dL — ABNORMAL LOW (ref 8.9–10.3)
Chloride: 105 mmol/L (ref 101–111)
Creatinine, Ser: 0.5 mg/dL (ref 0.44–1.00)
GFR calc Af Amer: >60 mL/min (ref >60)
GFR calc non Af Amer: >60 mL/min (ref >60)
Glucose, Bld: 75 mg/dL (ref 65–99)
Potassium: 4.6 mmol/L (ref 3.5–5.1)
Sodium: 134 mmol/L — ABNORMAL LOW (ref 135–145)
Total Bilirubin: 0.6 mg/dL (ref 0.3–1.2)
Total Protein: 5.9 g/dL — ABNORMAL LOW (ref 6.5–8.1)

## 2017-04-14 LAB — POCT URINALYSIS DIPSTICK
Blood, UA: NEGATIVE
Glucose, UA: NEGATIVE
Ketones, UA: NEGATIVE
Leukocytes, UA: NEGATIVE
Nitrite, UA: NEGATIVE

## 2017-04-14 LAB — TYPE AND SCREEN
ABO/RH(D): A POS
Antibody Screen: NEGATIVE

## 2017-04-14 LAB — PROTEIN / CREATININE RATIO, URINE
Creatinine, Urine: 35 mg/dL
Protein Creatinine Ratio: 0.31 mg/mg{Cre} — ABNORMAL HIGH (ref 0.00–0.15)
Total Protein, Urine: 11 mg/dL

## 2017-04-14 LAB — ABO/RH: ABO/RH(D): A POS

## 2017-04-14 MED ORDER — MISOPROSTOL 50MCG HALF TABLET
50.0000 ug | ORAL_TABLET | ORAL | Status: DC | PRN
  Administered 2017-04-14 (×2): 50 ug via BUCCAL
  Filled 2017-04-14 (×3): qty 1

## 2017-04-14 MED ORDER — MAGNESIUM SULFATE 40 G IN LACTATED RINGERS - SIMPLE
2.0000 g/h | INTRAVENOUS | Status: DC
  Administered 2017-04-14 – 2017-04-15 (×2): 2 g/h via INTRAVENOUS
  Filled 2017-04-14 (×2): qty 40

## 2017-04-14 MED ORDER — OXYTOCIN 40 UNITS IN LACTATED RINGERS INFUSION - SIMPLE MED
1.0000 m[IU]/min | INTRAVENOUS | Status: DC
  Administered 2017-04-15: 2 m[IU]/min via INTRAVENOUS

## 2017-04-14 MED ORDER — ONDANSETRON HCL 4 MG/2ML IJ SOLN: 4.0000 mg | Freq: Four times a day (QID) | INTRAMUSCULAR | Status: DC | PRN

## 2017-04-14 MED ORDER — FENTANYL CITRATE (PF) 100 MCG/2ML IJ SOLN: 100.0000 ug | INTRAMUSCULAR | Status: DC | PRN

## 2017-04-14 MED ORDER — LIDOCAINE HCL (PF) 1 % IJ SOLN
30.0000 mL | INTRAMUSCULAR | Status: DC | PRN
Start: 2017-04-14 — End: 2017-04-15
  Administered 2017-04-15: 30 mL via SUBCUTANEOUS
  Filled 2017-04-14: qty 30

## 2017-04-14 MED ORDER — HYDRALAZINE HCL 20 MG/ML IJ SOLN
5.0000 mg | INTRAMUSCULAR | Status: DC | PRN
  Administered 2017-04-14: 5 mg via INTRAVENOUS
  Filled 2017-04-14: qty 1

## 2017-04-14 MED ORDER — TERBUTALINE SULFATE 1 MG/ML IJ SOLN: 0.2500 mg | Freq: Once | INTRAMUSCULAR | Status: DC | PRN

## 2017-04-14 MED ORDER — PENICILLIN G POTASSIUM 5000000 UNITS IJ SOLR
5.0000 10*6.[IU] | Freq: Once | INTRAVENOUS | Status: AC
  Administered 2017-04-14: 5 10*6.[IU] via INTRAVENOUS
  Filled 2017-04-14: qty 5

## 2017-04-14 MED ORDER — LACTATED RINGERS IV SOLN
INTRAVENOUS | Status: DC
  Administered 2017-04-14 – 2017-04-15 (×2): via INTRAVENOUS

## 2017-04-14 MED ORDER — OXYTOCIN 40 UNITS IN LACTATED RINGERS INFUSION - SIMPLE MED: 1.0000 m[IU]/min | INTRAVENOUS | Status: DC

## 2017-04-14 MED ORDER — OXYTOCIN BOLUS FROM INFUSION
500.0000 mL | Freq: Once | INTRAVENOUS | Status: AC
  Administered 2017-04-15: 500 mL via INTRAVENOUS

## 2017-04-14 MED ORDER — PENICILLIN G POT IN DEXTROSE 60000 UNIT/ML IV SOLN
3.0000 10*6.[IU] | INTRAVENOUS | Status: DC
  Administered 2017-04-14 – 2017-04-15 (×4): 3 10*6.[IU] via INTRAVENOUS
  Filled 2017-04-14 (×8): qty 50

## 2017-04-14 MED ORDER — ACETAMINOPHEN 325 MG PO TABS
650.0000 mg | ORAL_TABLET | ORAL | Status: DC | PRN
  Filled 2017-04-14: qty 2

## 2017-04-14 MED ORDER — OXYCODONE-ACETAMINOPHEN 5-325 MG PO TABS
2.0000 | ORAL_TABLET | ORAL | Status: DC | PRN
  Administered 2017-04-14 – 2017-04-15 (×2): 2 via ORAL
  Filled 2017-04-14 (×2): qty 2

## 2017-04-14 MED ORDER — OXYTOCIN 40 UNITS IN LACTATED RINGERS INFUSION - SIMPLE MED
2.5000 [IU]/h | INTRAVENOUS | Status: DC
  Filled 2017-04-14: qty 1000

## 2017-04-14 MED ORDER — MAGNESIUM SULFATE BOLUS VIA INFUSION
4.0000 g | Freq: Once | INTRAVENOUS | Status: AC
  Administered 2017-04-14: 4 g via INTRAVENOUS
  Filled 2017-04-14: qty 500

## 2017-04-14 MED ORDER — LABETALOL HCL 5 MG/ML IV SOLN
20.0000 mg | INTRAVENOUS | Status: AC | PRN
  Administered 2017-04-14: 40 mg via INTRAVENOUS
  Administered 2017-04-14: 20 mg via INTRAVENOUS
  Filled 2017-04-14: qty 4
  Filled 2017-04-14: qty 8

## 2017-04-14 MED ORDER — LACTATED RINGERS IV SOLN: 500.0000 mL | INTRAVENOUS | Status: DC | PRN

## 2017-04-14 MED ORDER — SOD CITRATE-CITRIC ACID 500-334 MG/5ML PO SOLN: 30.0000 mL | ORAL | Status: DC | PRN

## 2017-04-14 MED ORDER — OXYCODONE-ACETAMINOPHEN 5-325 MG PO TABS
1.0000 | ORAL_TABLET | ORAL | Status: DC | PRN
  Administered 2017-04-15: 1 via ORAL
  Filled 2017-04-14: qty 1

## 2017-04-14 NOTE — Progress Notes (Signed)

## 2017-04-14 NOTE — Progress Notes (Addendum)
Hypoglycemic Event  CBG: 69  Treatment: 15 GM carbohydrate snack  Symptoms: None  Follow-up CBG: Time:1435 CBG Result: 103  Possible Reasons for Event: Unknown  Comments/MD notified:Dr. Rachelle HoraMoss notified at 1418  Dr. Rachelle HoraMoss notified of follow up CBG    Jacinto ReapSarah K Ashleymarie Granderson

## 2017-04-14 NOTE — Progress Notes (Addendum)
Labor Progress Note [name] is a 36 y.o. [phone] at 5871w1d presented for IOL for preeclampsia with severe features (BP). S: No complaints  O:  BP (!) 145/96   Pulse 98   Temp 98.2 F (36.8 C) (Oral)   Resp 18   Ht 5\' 2"  (1.575 m)   Wt 257 lb 14.4 oz (117 kg)   LMP 08/04/2016 (Exact Date)   BMI 47.17 kg/m  EFM: 130 bpm/mod var/pos acels/no decels  CVE: Dilation: 1 Effacement (%): Thick Station: -3 Presentation: Vertex Exam by:: Dr. Rachelle HoraMoss   A&P: 36 y.o. [id number] 3871w1d here for IOL for preeclampsia with severe features. #Labor: start cytotec q4hr prn for cervical ripening. Unable to place foley bulb at this time. Attempt again in couple hours. #preeclampsia with severe features: continue magnesium infusion. Currently asymptomatic.blood pressure no longer in severe range after labetalol and hydralazine. #history of shoulder dystocia: EFW 5lb12oz by US today. Proceed with vaginal delivery.  Rolm BookbinderAmber Carsyn Taubman, DO 3:34 PM

## 2017-04-14 NOTE — Progress Notes (Signed)
Labor Progress Note Wanda BuccoJennifer M Degraaf is a 36 y.o. [id number] at 5266w1d presented for IOL for preeclampsia with severe features.  S:no complaints   O:  BP (!) 154/98   Pulse 93   Temp 97.7 F (36.5 C) (Oral)   Resp 16   Ht 5\' 2"  (1.575 m)   Wt 257 lb 14.4 oz (117 kg)   LMP 08/04/2016 (Exact Date)   BMI 47.17 kg/m  EFM: 130 bpm/mod var/pos acels/no decels  CVE: Dilation: 3 Effacement (%): 30 Station: -3 Presentation: Vertex Exam by:: Dr. Rachelle HoraMoss   A&P: 36 y.o. [id number] 6966w1d here for IOL for preeclampsia with severe features. #Labor: Cervix remains thick. 1 more dose of cytotec, then likely start pitocin in 4 hours. Foley bulb placed.  # preeclampsia: asymptomatic. Continue magnesium infusion. Blood pressure well controlled.  Rolm BookbinderAmber Redell Nazir, DO 7:53 PM

## 2017-04-14 NOTE — Progress Notes (Signed)
HIGH-RISK PREGNANCY VISIT Patient name: [name] MRN [id number]  Date of birth: [birth year] Chief Complaint:   High Risk Gestation (GBS, GC/CHL; NST)  History of Present Illness:   [name] is a 36 y.o. [id number] female at 4571w1d with an Estimated Date of Delivery: 05/11/17 being seen today for ongoing management of a high-risk pregnancy complicated by A1DM, GHTN on Labetalol 200mg  TID- has taken am dose .  Today she reports sugars all normal. Reports headache, took apap about 1hr ago, has helped some, but HA still there. Denies visual changes, RUQ/epigastric pain, N/V. Contractions: Not present. Vag. Bleeding: None.  Movement: Present. denies leaking of fluid.  Review of Systems:   Pertinent items are noted in HPI Denies abnormal vaginal discharge w/ itching/odor/irritation, headaches, visual changes, shortness of breath, chest pain, abdominal pain, severe nausea/vomiting, or problems with urination or bowel movements unless otherwise stated above. Pertinent History Reviewed:  Reviewed past medical,surgical, social, obstetrical and family history.  Reviewed problem list, medications and allergies. Physical Assessment:   Vitals:   04/14/17 1007  BP: (!) 164/106  Pulse: (!) 102  Weight: 259 lb 6.4 oz (117.7 kg)  Body mass index is 47.44 kg/m.           Physical Examination:   General appearance: alert, well appearing, and in no distress  Mental status: alert, oriented to person, place, and time  Skin: warm & dry   Extremities: Edema: Moderate pitting, indentation subsides rapidly    Cardiovascular: normal heart rate noted  Respiratory: normal respiratory effort, no distress  Abdomen: gravid, soft, non-tender  Pelvic: gbs, gc/ct collected, deferred SVE as she will be checked at hosp         Fetal Status: Fetal Heart Rate (bpm): 110   Movement: Present    Fetal Surveillance Testing today: NST: FHR baseline 110 bpm, Variability: moderate, Accelerations:present,  Decelerations:  Absent= Cat 1/Reactive     Results for orders placed or performed in visit on 04/14/17 (from the past 24 hour(s))  POCT urinalysis dipstick   Collection Time: 04/14/17 10:09 AM  Result Value Ref Range   Color, UA     Clarity, UA     Glucose, UA neg    Bilirubin, UA     Ketones, UA neg    Spec Grav, UA  1.010 - 1.025   Blood, UA neg    pH, UA  5.0 - 8.0   Protein, UA 1+    Urobilinogen, UA  0.2 or 1.0 E.U./dL   Nitrite, UA neg    Leukocytes, UA Negative Negative   Appearance     Odor      Assessment & Plan:  1) High-risk pregnancy [id number] at 971w1d with an Estimated Date of Delivery: 05/11/17   2) GHTN now w/ severe range BP, HA, & 1+proteinuria> despite Labetalol 200mg  TID, discussed w/ [facility], advises direct admit for IOL. Received BMZ 12/24 & 12/26. GBS, gc/ct done today. Notified L&D charge and Dr. Rachelle HoraMoss   3) A1DM, stable  4) H/O shoulder dystocia> had planned EFW next week, consider getting on admission today  Labs/procedures today: gbs, gc/ct  [facility]  [facility]  Reviewed: IOL, All questions were answered.  Follow-up: Return for cancel 1/3 appt, schedule for pp bp check in 1 wk please.  Orders Placed This Encounter  Procedures  . GC/Chlamydia Probe Amp  . Strep Gp B NAA  . POCT urinalysis dipstick   [name], Kimberly Randall CNM, [facility] 04/14/2017 10:59  AM  

## 2017-04-14 NOTE — Anesthesia Pain Management Evaluation Note (Signed)
  CRNA Pain Management Visit Note  Patient: [name], 36 y.o., female  "Hello I am a [facility]. We have an anesthesia team available at all times to provide care throughout the hospital, including epidural management and anesthesia for C-section. I don't know your plan for the delivery whether it a natural birth, water birth, IV sedation, nitrous supplementation, doula or epidural, but we want to meet your pain goals."   1.Was your pain managed to your expectations on prior hospitalizations?   Yes   2.What is your expectation for pain management during this hospitalization?     Epidural  3.How can we help you reach that goal? epidural  Record the patient's initial score and the patient's pain goal.   Pain: 0  Pain Goal: 6 [facility] wants you to be able to say your pain was always managed very well.  Donelle Hise 04/14/2017

## 2017-04-14 NOTE — H&P (Signed)
Obstetric History and Physical  Wanda BuccoJennifer M Andrews is a 36 y.o. [id number] with IUP at 3118w1d presenting for IOL for gHTN with superimposed preeclampsia with severe features. Blood pressure elevated to 164/106 in office today and patient complains of headache. Currently takes labetalol 200 mg TID. Also has A1GDM, no recent growth US.  Prenatal Course [facility] Care: [facility] Pregnancy complications or risks: Patient Active Problem List   Diagnosis Date Noted  . Preeclampsia, severe, third trimester 04/14/2017  . Gestational hypertension, third trimester 04/09/2017  . Gestational diabetes 03/04/2017  . Supervision of high risk pregnancy, antepartum 10/11/2016  . History of shoulder dystocia in prior pregnancy, currently pregnant 10/11/2016  . Hx of preeclampsia, prior pregnancy, currently pregnant 10/11/2016  . Knee pain, bilateral 01/31/2016  . Migraine headache 08/25/2014  . Hyperlipidemia 07/23/2012  . Palpitations 07/22/2012  . GERD (gastroesophageal reflux disease) 07/22/2012  . Insomnia 09/05/2010  . Allergic rhinitis 03/12/2010  . Morbid obesity (HCC) 10/09/2009   She plans to breastfeed She is undecided for contraception.  Prenatal labs and studies: ABO, Rh: A/Positive/-- (06/29 1018) Antibody: Negative (10/30 0854) Rubella: 2.02 (06/29 1018) RPR: Non Reactive (10/30 0854)  HBsAg: Negative (06/29 1018)  HIV: Non Reactive (10/30 0854)  GBS:  pending 1 hr Glucola  abnormal Genetic screening declined Anatomy US normal    [facility]  [facility] at  [facility]  FOB Tinnie GensJeffrey Swigert  Dating By LMP, 1st tri U/S 7wk  Pap 03/02/17 neg  GC/CT Initial:  -/-              36+wks:  Genetic Screen declined  CF screen declined  Anatomic US Normal female  Flu vaccine Sept 2018 @ work  Tdap Recommended ~ 28wks  Glucose Screen  2 hr 77/180/142 (abnormal)  GBS   Feed Preference breast  Contraception undecided  Circumcision n/a  Childbirth Classes declined  Pediatrician  [facility]    Prenatal Transfer Tool  Maternal Diabetes: Yes:  Diabetes Type:  Diet controlled Genetic Screening: Declined Maternal Ultrasounds/Referrals: Normal Fetal Ultrasounds or other Referrals:  None Maternal Substance Abuse:  No Significant Maternal Medications:  None Significant Maternal Lab Results: None  Past Medical History:  Diagnosis Date  . Allergy   . Arthritis    BACK  . Back pain 09/05/2010  . Headache(784.0)    MIGRAINES  -- LAST ONE  09/2011  . Hyperlipidemia 07/23/2012  . Obesity (BMI 30-39.9) 10/09/2009  . Palpitations   . Pregnancy induced hypertension   . Pregnant 05/16/2015  . Vaginal spotting 05/16/2015    Past Surgical History:  Procedure Laterality Date  . CHOLECYSTECTOMY N/A 08/24/2012   Procedure: LAPAROSCOPIC CHOLECYSTECTOMY;  Surgeon: Dalia HeadingMark A Jenkins, MD;  Location: [facility];  Service: General;  Laterality: N/A;  . DILATION AND CURETTAGE OF UTERUS    . LUMBAR LAMINECTOMY/DECOMPRESSION MICRODISCECTOMY  12/03/2011   Procedure: LUMBAR LAMINECTOMY/DECOMPRESSION MICRODISCECTOMY 1 LEVEL;  Surgeon: Maeola HarmanJoseph Stern, MD;  Location: [facility];  Service: Neurosurgery;  Laterality: Left;  LEFT Lumbar four-five microdiskectomy  . TONSILLECTOMY    . WISDOM TOOTH EXTRACTION      OB History  Gravida Para Term Preterm AB Living  6 2 2   3 2   SAB TAB Ectopic Multiple Live Births  3       2    # Outcome Date GA Lbr Len/2nd Weight Sex Delivery Anes PTL Lv  6 Current           5 SAB 2017  4 SAB 2010          3 Term 02/20/05 [redacted]w[redacted]d  9 lb 2 oz (4.139 kg) F Vag-Spont EPI N LIV     Complications: Shoulder Dystocia,PIH (pregnancy induced hypertension)  2 SAB 2005          1 Term 03/16/96 [redacted]w[redacted]d  8 lb 2 oz (3.685 kg) F Vag-Vacuum None N LIV     Complications: PIH (pregnancy induced hypertension)      Social History   Socioeconomic History  . Marital status: Married    Spouse name: Not on file  . Number of children: Not on file  . Years of  education: Not on file  . Highest education level: Not on file  Social Needs  . Financial resource strain: Not on file  . Food insecurity - worry: Not on file  . Food insecurity - inability: Not on file  . Transportation needs - medical: Not on file  . Transportation needs - non-medical: Not on file  Occupational History    Employer: [employer]  Tobacco Use  . Smoking status: Never Smoker  . Smokeless tobacco: Never Used  Substance and Sexual Activity  . Alcohol use: No  . Drug use: No  . Sexual activity: Yes    Birth control/protection: None    Comment: NOT USING ANY PROTECTION  Other Topics Concern  . Not on file  Social History Narrative  . Not on file    Family History  Problem Relation Age of Onset  . Arthritis Mother   . Diabetes Mother   . Diabetes Maternal Grandmother   . Heart disease Maternal Grandmother   . Diabetes Paternal Grandmother   . Heart disease Paternal Grandmother   . Heart attack Paternal Grandmother   . Heart disease Paternal Grandfather   . Diabetes Paternal Grandfather     Medications Prior to Admission  Medication Sig Dispense Refill Last Dose  . acetaminophen (TYLENOL) 500 MG tablet Take 1,000 mg by mouth as needed.   Taking  . aspirin (ASPIRIN 81) 81 MG chewable tablet Chew 81 mg by mouth daily.   Taking  . glucose blood test strip Use as instructed 100 each prn Taking  . labetalol (NORMODYNE) 200 MG tablet Take 1 tablet (200 mg total) by mouth 3 (three) times daily. 90 tablet 0 Taking  . labetalol (NORMODYNE) 200 MG tablet Take 1 tablet (200 mg total) by mouth 3 (three) times daily. 90 tablet 0 Taking  . loratadine (CLARITIN) 10 MG tablet Take 10 mg by mouth daily.     Taking  . montelukast (SINGULAIR) 10 MG tablet TAKE 1 TABLET (10 MG TOTAL) BY MOUTH AT BEDTIME. 90 tablet 1 Taking  . ONE TOUCH LANCETS MISC Use as directed 200 each prn Taking  . pantoprazole (PROTONIX) 20 MG tablet Take 1 tablet (20 mg total) by mouth daily. 30 tablet  6 Taking  . Prenatal Vit-Fe Fumarate-FA (MULTIVITAMIN-PRENATAL) 27-0.8 MG TABS tablet Take 1 tablet by mouth daily at 12 noon.   Taking    Allergies  Allergen Reactions  . Shellfish Allergy Anaphylaxis  . Cetirizine Hcl   . Chocolate Hives    Review of Systems: Negative except for what is mentioned in HPI.  Physical Exam: LMP 08/04/2016 (Exact Date)  CONSTITUTIONAL: Well-developed, well-nourished female in no acute distress.  HENT:  Normocephalic, atraumatic, External right and left ear normal. Oropharynx is clear and moist EYES: Conjunctivae and EOM are normal. Pupils are equal, round, and reactive to light. No  scleral icterus.  NECK: Normal range of motion, supple, no masses SKIN: Skin is warm and dry. No rash noted. Not diaphoretic. No erythema. No pallor. NEUROLOGIC: Alert and oriented to person, place, and time. Normal reflexes, muscle tone coordination. No cranial nerve deficit noted. PSYCHIATRIC: Normal mood and affect. Normal behavior. Normal judgment and thought content. CARDIOVASCULAR: Normal heart rate noted, regular rhythm RESPIRATORY: Effort and breath sounds normal, no problems with respiration noted ABDOMEN: Soft, nontender, nondistended, gravid. MUSCULOSKELETAL: Normal range of motion. No edema and no tenderness. 2+ distal pulses.  FHT:  Baseline rate 130 bpm   Variability moderate  Accelerations present   Decelerations none    Pertinent Labs/Studies:   Results for orders placed or performed in visit on 04/14/17 (from the past 24 hour(s))  POCT urinalysis dipstick     Status: None   Collection Time: 04/14/17 10:09 AM  Result Value Ref Range   Color, UA     Clarity, UA     Glucose, UA neg    Bilirubin, UA     Ketones, UA neg    Spec Grav, UA  1.010 - 1.025   Blood, UA neg    pH, UA  5.0 - 8.0   Protein, UA 1+    Urobilinogen, UA  0.2 or 1.0 E.U./dL   Nitrite, UA neg    Leukocytes, UA Negative Negative   Appearance     Odor      Assessment : Wanda BuccoJennifer  M Witters is a 36 y.o. [id number] at 2074w1d being admitted for induction of labor due to gHTN with superimposed preeclampsia with severe features.  Plan: Labor: Induction/Augmentation as ordered as per protocol. Start magnesium sulfate. Analgesia as needed. Ordered US for EFW due to GDM and history of shoulder dystocia. FWB: Reassuring fetal heart tracing.  GBS culture collected in office today and pending. Start prophylaxis due to preterm. Betamethasone given 12/24 and 12/26. Delivery plan: Hopeful for vaginal delivery MOF- breast MOC- undecided   Chubb Corporationmber Ahriyah Vannest, DO

## 2017-04-15 ENCOUNTER — Inpatient Hospital Stay (HOSPITAL_COMMUNITY): Payer: 59 | Admitting: Anesthesiology

## 2017-04-15 ENCOUNTER — Encounter (HOSPITAL_COMMUNITY): Payer: Self-pay | Admitting: *Deleted

## 2017-04-15 DIAGNOSIS — O24419 Gestational diabetes mellitus in pregnancy, unspecified control: Secondary | ICD-10-CM

## 2017-04-15 DIAGNOSIS — Z3A36 36 weeks gestation of pregnancy: Secondary | ICD-10-CM

## 2017-04-15 DIAGNOSIS — O1414 Severe pre-eclampsia complicating childbirth: Secondary | ICD-10-CM

## 2017-04-15 LAB — CBC
HCT: 35.5 % — ABNORMAL LOW (ref 36.0–46.0)
Hemoglobin: 11.5 g/dL — ABNORMAL LOW (ref 12.0–15.0)
MCH: 27.4 pg (ref 26.0–34.0)
MCHC: 32.4 g/dL (ref 30.0–36.0)
MCV: 84.5 fL (ref 78.0–100.0)
Platelets: 291 10*3/uL (ref 150–400)
RBC: 4.2 MIL/uL (ref 3.87–5.11)
RDW: 14.7 % (ref 11.5–15.5)
WBC: 17 10*3/uL — ABNORMAL HIGH (ref 4.0–10.5)

## 2017-04-15 LAB — RPR: RPR Ser Ql: NONREACTIVE

## 2017-04-15 MED ORDER — ACETAMINOPHEN 325 MG PO TABS: 650.0000 mg | ORAL_TABLET | ORAL | Status: DC | PRN

## 2017-04-15 MED ORDER — LABETALOL HCL 5 MG/ML IV SOLN
20.0000 mg | INTRAVENOUS | Status: DC | PRN
  Administered 2017-04-15: 20 mg via INTRAVENOUS

## 2017-04-15 MED ORDER — DIBUCAINE 1 % RE OINT: 1.0000 "application " | TOPICAL_OINTMENT | RECTAL | Status: DC | PRN

## 2017-04-15 MED ORDER — MAGNESIUM SULFATE 40 G IN LACTATED RINGERS - SIMPLE
2.0000 g/h | INTRAVENOUS | Status: DC
  Filled 2017-04-15 (×2): qty 500

## 2017-04-15 MED ORDER — TETANUS-DIPHTH-ACELL PERTUSSIS 5-2.5-18.5 LF-MCG/0.5 IM SUSP: 0.5000 mL | Freq: Once | INTRAMUSCULAR | Status: DC

## 2017-04-15 MED ORDER — HYDRALAZINE HCL 20 MG/ML IJ SOLN: 10.0000 mg | Freq: Once | INTRAMUSCULAR | Status: DC | PRN

## 2017-04-15 MED ORDER — PHENYLEPHRINE 40 MCG/ML (10ML) SYRINGE FOR IV PUSH (FOR BLOOD PRESSURE SUPPORT)
80.0000 ug | PREFILLED_SYRINGE | INTRAVENOUS | Status: DC | PRN
  Filled 2017-04-15: qty 5

## 2017-04-15 MED ORDER — OXYCODONE-ACETAMINOPHEN 5-325 MG PO TABS: 1.0000 | ORAL_TABLET | ORAL | Status: DC | PRN

## 2017-04-15 MED ORDER — BENZOCAINE-MENTHOL 20-0.5 % EX AERO
1.0000 "application " | INHALATION_SPRAY | CUTANEOUS | Status: DC | PRN
  Administered 2017-04-15: 1 via TOPICAL
  Filled 2017-04-15: qty 56

## 2017-04-15 MED ORDER — DIPHENHYDRAMINE HCL 50 MG/ML IJ SOLN: 12.5000 mg | INTRAMUSCULAR | Status: DC | PRN

## 2017-04-15 MED ORDER — PHENYLEPHRINE 40 MCG/ML (10ML) SYRINGE FOR IV PUSH (FOR BLOOD PRESSURE SUPPORT)
80.0000 ug | PREFILLED_SYRINGE | INTRAVENOUS | Status: DC | PRN
  Filled 2017-04-15: qty 5
  Filled 2017-04-15: qty 10

## 2017-04-15 MED ORDER — EPHEDRINE 5 MG/ML INJ
10.0000 mg | INTRAVENOUS | Status: DC | PRN
  Filled 2017-04-15: qty 2

## 2017-04-15 MED ORDER — DIPHENHYDRAMINE HCL 25 MG PO CAPS: 25.0000 mg | ORAL_CAPSULE | Freq: Four times a day (QID) | ORAL | Status: DC | PRN

## 2017-04-15 MED ORDER — ONDANSETRON HCL 4 MG/2ML IJ SOLN: 4.0000 mg | INTRAMUSCULAR | Status: DC | PRN

## 2017-04-15 MED ORDER — WITCH HAZEL-GLYCERIN EX PADS: 1.0000 "application " | MEDICATED_PAD | CUTANEOUS | Status: DC | PRN

## 2017-04-15 MED ORDER — LACTATED RINGERS IV SOLN
INTRAVENOUS | Status: DC
  Administered 2017-04-15 – 2017-04-16 (×2): via INTRAVENOUS

## 2017-04-15 MED ORDER — ZOLPIDEM TARTRATE 5 MG PO TABS: 5.0000 mg | ORAL_TABLET | Freq: Every evening | ORAL | Status: DC | PRN

## 2017-04-15 MED ORDER — ONDANSETRON HCL 4 MG PO TABS: 4.0000 mg | ORAL_TABLET | ORAL | Status: DC | PRN

## 2017-04-15 MED ORDER — PRENATAL MULTIVITAMIN CH
1.0000 | ORAL_TABLET | Freq: Every day | ORAL | Status: DC
  Administered 2017-04-15 – 2017-04-16 (×2): 1 via ORAL
  Filled 2017-04-15 (×3): qty 1

## 2017-04-15 MED ORDER — SENNOSIDES-DOCUSATE SODIUM 8.6-50 MG PO TABS
2.0000 | ORAL_TABLET | ORAL | Status: DC
  Administered 2017-04-15 – 2017-04-16 (×2): 2 via ORAL
  Filled 2017-04-15 (×2): qty 2

## 2017-04-15 MED ORDER — OXYCODONE-ACETAMINOPHEN 5-325 MG PO TABS: 2.0000 | ORAL_TABLET | ORAL | Status: DC | PRN

## 2017-04-15 MED ORDER — LACTATED RINGERS IV SOLN: 500.0000 mL | Freq: Once | INTRAVENOUS | Status: DC

## 2017-04-15 MED ORDER — IBUPROFEN 600 MG PO TABS
600.0000 mg | ORAL_TABLET | Freq: Four times a day (QID) | ORAL | Status: DC
  Administered 2017-04-15 – 2017-04-17 (×7): 600 mg via ORAL
  Filled 2017-04-15 (×8): qty 1

## 2017-04-15 MED ORDER — COCONUT OIL OIL: 1.0000 "application " | TOPICAL_OIL | Status: DC | PRN

## 2017-04-15 MED ORDER — LIDOCAINE HCL (PF) 1 % IJ SOLN
INTRAMUSCULAR | Status: DC | PRN
  Administered 2017-04-15: 13 mL via EPIDURAL

## 2017-04-15 MED ORDER — LABETALOL HCL 5 MG/ML IV SOLN
INTRAVENOUS | Status: AC
  Administered 2017-04-15: 20 mg via INTRAVENOUS
  Filled 2017-04-15: qty 4

## 2017-04-15 MED ORDER — SIMETHICONE 80 MG PO CHEW: 80.0000 mg | CHEWABLE_TABLET | ORAL | Status: DC | PRN

## 2017-04-15 MED ORDER — FENTANYL 2.5 MCG/ML BUPIVACAINE 1/10 % EPIDURAL INFUSION (WH - ANES)
14.0000 mL/h | INTRAMUSCULAR | Status: DC | PRN
  Administered 2017-04-15: 14 mL/h via EPIDURAL
  Filled 2017-04-15: qty 100

## 2017-04-15 NOTE — Anesthesia Postprocedure Evaluation (Signed)
Anesthesia Post Note  Patient: Wanda BevelsJennifer M Andrews  Procedure(s) Performed: AN AD HOC LABOR EPIDURAL     Patient location during evaluation: [facility] Anesthesia Type: Epidural Level of consciousness: awake and alert and oriented Pain management: pain level controlled Vital Signs Assessment: post-procedure vital signs reviewed and stable Respiratory status: spontaneous breathing and nonlabored ventilation Cardiovascular status: stable Postop Assessment: no headache, patient able to bend at knees, no backache, no apparent nausea or vomiting, epidural receding and adequate PO intake Anesthetic complications: no    Last Vitals:  Vitals:   04/15/17 1159 04/15/17 1300  BP: 140/88   Pulse: (!) 106   Resp: 20 20  Temp:    SpO2: 99%     Last Pain:  Vitals:   04/15/17 1200  TempSrc:   PainSc: 0-No pain   Pain Goal: Patients Stated Pain Goal: 3 (04/15/17 1100)               Sydnie Sigmund Hristova

## 2017-04-15 NOTE — Progress Notes (Signed)
Labor Progress Note Wanda BuccoJennifer M Andrews is a 37 y.o. [id number] at 3132w2d presented for IOL for preeclampsia with severe features. S: Patient complains of headache  O:  BP (!) 145/103   Pulse 92   Temp 97.7 F (36.5 C) (Oral)   Resp 16   Ht 5\' 2"  (1.575 m)   Wt 257 lb 14.4 oz (117 kg)   LMP 08/04/2016 (Exact Date)   BMI 47.17 kg/m  EFM: 140 bpm/mod var/pos acels  CVE: Dilation: 4.5 Effacement (%): 50 Station: -3 Presentation: Vertex Exam by:: Lorn Junes. Goodman, RN   A&P: 37 y.o. [id number] 7032w2d here for IOL for preeclampsia with severe features. #Labor: FB out. Continue pitocin #preeclampsia: patient complains of headache and percocet given. Blood pressure not in severe range.  Jetaime Pinnix, DO 1:49 AM

## 2017-04-15 NOTE — L&D Delivery Note (Signed)
Delivery Note  On arrival at [phone] patient with involuntary urge to push. Found to C/C/+2. One push, and at 9:11 AM a viable female was delivered via Vaginal, Spontaneous (Presentation:OA).  APGAR: 8, 9; weight pending .   Placenta status:shultz , complete/intact.  Cord: triple nuchal  with the following complications: none  .  Cord pH: NA  Anesthesia:  Epidural, local  Episiotomy: None Lacerations: 2nd degree;Perineal Suture Repair: 3.0 monocryl  Est. Blood Loss (mL):  400cc  Mom to postpartum.  Baby to Couplet care / Skin to Skin.  Thressa ShellerHeather Arlee Bossard 04/15/2017, 9:39 AM

## 2017-04-15 NOTE — Anesthesia Postprocedure Evaluation (Signed)
Anesthesia Post Note  Patient: Wanda BevelsJennifer M Andrews  Procedure(s) Performed: AN AD HOC LABOR EPIDURAL     Patient location during evaluation: [facility] Unit Anesthesia Type: Epidural Level of consciousness: awake Pain management: pain level controlled Vital Signs Assessment: post-procedure vital signs reviewed and stable Respiratory status: spontaneous breathing Cardiovascular status: stable Postop Assessment: no headache, no backache, epidural receding, no apparent nausea or vomiting, patient able to bend at knees and adequate PO intake Anesthetic complications: no    Last Vitals:  Vitals:   04/15/17 1159 04/15/17 1300  BP: 140/88   Pulse: (!) 106   Resp: 20 20  Temp:    SpO2: 99%     Last Pain:  Vitals:   04/15/17 1200  TempSrc:   PainSc: 0-No pain   Pain Goal: Patients Stated Pain Goal: 3 (04/15/17 1100)               Omah Dewalt

## 2017-04-15 NOTE — Progress Notes (Signed)
Pt. Passed golf ball sized clot when up to the bathroom, after foley bulb came out.  Dr. Earlene Plateravis in department at the time; notified of clot.

## 2017-04-15 NOTE — Progress Notes (Signed)
00:28-00:48 difficulty tracing FHR. RN x 2 at bedside adjusting and assessing

## 2017-04-15 NOTE — Anesthesia Procedure Notes (Signed)
Epidural Patient location during procedure: OB Start time: 04/15/2017 6:02 AM End time: 04/15/2017 6:22 AM  Staffing Anesthesiologist: Lowella CurbMiller, Kaveri Perras Ray, MD Performed: anesthesiologist   Preanesthetic Checklist Completed: patient identified, site marked, surgical consent, pre-op evaluation, timeout performed, IV checked, risks and benefits discussed and monitors and equipment checked  Epidural Patient position: sitting Prep: ChloraPrep Patient monitoring: heart rate, cardiac monitor, continuous pulse ox and blood pressure Approach: midline Location: L2-L3 Injection technique: LOR saline  Needle:  Needle type: Tuohy  Needle gauge: 17 G Needle length: 9 cm Needle insertion depth: 7 cm Catheter type: closed end flexible Catheter size: 20 Guage Catheter at skin depth: 11 cm Test dose: negative  Assessment Events: blood not aspirated, injection not painful, no injection resistance, negative IV test and no paresthesia  Additional Notes Reason for block:procedure for pain

## 2017-04-15 NOTE — Anesthesia Preprocedure Evaluation (Signed)
Anesthesia Evaluation  Patient identified by MRN, date of birth, ID band Patient awake    Reviewed: Allergy & Precautions, H&P , NPO status , Patient's Chart, lab work & pertinent test results  History of Anesthesia Complications Negative for: history of anesthetic complications  Airway Mallampati: II  TM Distance: >3 FB Neck ROM: Full    Dental  (+) Teeth Intact   Pulmonary neg pulmonary ROS,    breath sounds clear to auscultation       Cardiovascular hypertension, negative cardio ROS   Rhythm:Regular Rate:Normal     Neuro/Psych  Headaches,    GI/Hepatic GERD  Medicated and Controlled,  Endo/Other  Morbid obesity  Renal/GU      Musculoskeletal   Abdominal   Peds  Hematology   Anesthesia Other Findings   Reproductive/Obstetrics (+) Pregnancy                             Anesthesia Physical  Anesthesia Plan  ASA: II  Anesthesia Plan: Epidural   Post-op Pain Management:    Induction:   PONV Risk Score and Plan: Treatment may vary due to age or medical condition  Airway Management Planned: Natural Airway  Additional Equipment:   Intra-op Plan:   Post-operative Plan:   Informed Consent: I have reviewed the patients History and Physical, chart, labs and discussed the procedure including the risks, benefits and alternatives for the proposed anesthesia with the patient or authorized representative who has indicated his/her understanding and acceptance.     Plan Discussed with:   Anesthesia Plan Comments:         Anesthesia Quick Evaluation

## 2017-04-16 LAB — STREP GP B NAA: Strep Gp B NAA: NEGATIVE

## 2017-04-16 LAB — GC/CHLAMYDIA PROBE AMP
Chlamydia trachomatis, NAA: NEGATIVE
Neisseria gonorrhoeae by PCR: NEGATIVE

## 2017-04-16 MED ORDER — MAGNESIUM SULFATE 40 G IN LACTATED RINGERS - SIMPLE: 2.0000 g/h | INTRAVENOUS | Status: DC

## 2017-04-16 NOTE — Progress Notes (Signed)
Post Partum Day 1 Subjective: no complaints  Objective: Blood pressure 140/87, pulse 96, temperature 97.6 F (36.4 C), temperature source Oral, resp. rate 20, height 5\' 2"  (1.575 m), weight 117 kg (257 lb 14.4 oz), last menstrual period 08/04/2016, SpO2 99 %, unknown if currently breastfeeding.  Physical Exam:  General: alert, cooperative and no distress Lochia: appropriate Uterine Fundus: firm Incision: n/a DVT Evaluation: No evidence of DVT seen on physical exam.  Recent Labs    04/14/17 1315 04/15/17 0122  HGB 11.4* 11.5*  HCT 34.9* 35.5*    Assessment/Plan: Breastfeeding and Contraception not discussed   LOS: 2 days   Scheryl DarterJames Arnold 04/16/2017, 11:01 AM

## 2017-04-16 NOTE — Lactation Note (Signed)
This note was copied from a baby's chart. Lactation Consultation Note Baby 17 hrs old. Wt. 4.12 lbs. Mom is BF and supplementing w/22 cal. Similac w/slow flow nipples. Mom was BF in cradle position when LC entered rm. Mom denied painful latch.  Mom has 2 older children oldest 35 yrs old. BF her 35 yr old for 15 months.  LPI information sheet given. reviewed cluster feeding, I&O, supply and demand. Encouraged to BF STS or hold baby STS at times. Mom has DEBP and has pumped collecting a few drops of colostrum. Encouraged to hand express after pumping. 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 Has no questions or concerns at this time. Encouraged to call for assistance if needed. WH/LC brochure given w/resources, support groups and LC services.  Patient Name: Wanda Kathe MarinerJennifer Flink ZHYQM'[id number]'s Date: 04/16/2017 Reason for consult: Initial assessment;Late-preterm 34-36.6wks;Infant < 6lbs   Maternal Data Has patient been taught Hand Expression?: Yes Does the patient have breastfeeding experience prior to this delivery?: Yes  Feeding Feeding Type: Breast Fed Length of feed: 11 min  LATCH Score Latch: Grasps breast easily, tongue down, lips flanged, rhythmical sucking.  Audible Swallowing: A few with stimulation  Type of Nipple: Everted at rest and after stimulation  Comfort (Breast/Nipple): Soft / non-tender  Hold (Positioning): No assistance needed to correctly position infant at breast.  LATCH Score: 9  Interventions Interventions: Breast feeding basics reviewed  Lactation Tools Discussed/Used Tools: Pump Breast pump type: Double-Electric Breast Pump WIC Program: No Pump Review: Setup, frequency, and cleaning;Milk Storage Initiated by:: RN Date initiated:: 04/15/16   Consult Status Consult Status: Follow-up Date: 04/16/16 Follow-up type: In-patient    [name], Diamond NickelLAURA G 04/16/2017, 3:05 AM

## 2017-04-17 ENCOUNTER — Other Ambulatory Visit: Payer: 59

## 2017-04-17 ENCOUNTER — Encounter: Payer: 59 | Admitting: Obstetrics & Gynecology

## 2017-04-17 MED ORDER — IBUPROFEN 600 MG PO TABS: 600.0000 mg | ORAL_TABLET | Freq: Four times a day (QID) | ORAL | 0 refills | Status: DC

## 2017-04-17 MED FILL — IBUPROFEN 600 MG TABLET: 600 | 8 days supply | Qty: 30 | Fill #0

## 2017-04-17 NOTE — Discharge Summary (Signed)
OB Discharge Summary     Patient Name: [name] DOB: [birth year] MRN: [id number]  Date of admission: 04/14/2017 Delivering MD: Thressa ShellerHOGAN, HEATHER D   Date of discharge: 04/17/2017  Admitting diagnosis: INDUCTION Intrauterine pregnancy: 3638w2d     Secondary diagnosis:  Active Problems:   Preeclampsia, severe, third trimester   Preeclampsia, severe  Additional problems: gestational diabetes     Discharge diagnosis: Preterm Pregnancy Delivered                                                                                                Post partum procedures:none  Augmentation: Pitocin, Cytotec and Foley Balloon  Complications: None  Hospital course:  Induction of Labor With Vaginal Delivery   37 y.o. yo [phone] at 6538w2d was admitted to the hospital 04/14/2017 for induction of labor.  Indication for induction: Preeclampsia and A1 DM.  Patient had an uncomplicated labor course as follows: Membrane Rupture Time/Date: 7:30 AM ,04/15/2017   Intrapartum Procedures: Episiotomy: None [1]                                         Lacerations:  2nd degree [3];Perineal [11]  Patient had delivery of a Viable infant.  Information for the patient's newborn:  [name] [[id number]][[id number]]  Delivery Method: Vag-Spont   04/15/2017  Details of delivery can be found in separate delivery note.  Patient had a routine postpartum course. Patient is discharged home 04/17/17.  Physical exam  Vitals:   04/16/17 2337 04/17/17 0538 04/17/17 0540 04/17/17 0753  BP: 114/86 138/83  132/79  Pulse: 89 80  75  Resp: 18 18  18   Temp: 98.4 F (36.9 C) 98.4 F (36.9 C)  98.2 F (36.8 C)  TempSrc: Oral Oral  Oral  SpO2: 99% 99%  96%  Weight:   117.1 kg (258 lb 4 oz)   Height:       General: alert, cooperative and no distress Lochia: appropriate Uterine Fundus: firm Incision: N/A DVT Evaluation: No evidence of DVT seen on physical exam. Labs: Lab Results  Component Value Date   WBC 17.0 (H)  04/15/2017   HGB 11.5 (L) 04/15/2017   HCT 35.5 (L) 04/15/2017   MCV 84.5 04/15/2017   PLT 291 04/15/2017   CMP Latest Ref Rng & Units 04/14/2017  Glucose 65 - 99 mg/dL 75  BUN 6 - 20 mg/dL 13  Creatinine 1.020.44 - 7.251.00 mg/dL 3.660.50  Sodium 440135 - 347145 mmol/L 134(L)  Potassium 3.5 - 5.1 mmol/L 4.6  Chloride 101 - 111 mmol/L 105  CO2 22 - 32 mmol/L 18(L)  Calcium 8.9 - 10.3 mg/dL 4.2(V8.6(L)  Total Protein 6.5 - 8.1 g/dL 5.9(L)  Total Bilirubin 0.3 - 1.2 mg/dL 0.6  Alkaline Phos 38 - 126 U/L 184(H)  AST 15 - 41 U/L 20  ALT 14 - 54 U/L 10(L)    Discharge instruction: per After Visit Summary and "Baby and Me Booklet".  After visit meds:  Allergies as of 04/17/2017  Reactions   Shellfish Allergy Anaphylaxis   Cetirizine Hcl Swelling   Chocolate Hives      Medication List    STOP taking these medications   labetalol 200 MG tablet Commonly known as:  NORMODYNE   ONE TOUCH LANCETS Misc     TAKE these medications   acetaminophen 500 MG tablet Commonly known as:  TYLENOL Take 1,000 mg by mouth as needed.   ASPIRIN 81 81 MG chewable tablet Generic drug:  aspirin Chew 81 mg by mouth daily.   glucose blood test strip Use as instructed   ibuprofen 600 MG tablet Commonly known as:  ADVIL,MOTRIN Take 1 tablet (600 mg total) by mouth every 6 (six) hours.   loratadine 10 MG tablet Commonly known as:  CLARITIN Take 10 mg by mouth daily.   montelukast 10 MG tablet Commonly known as:  SINGULAIR TAKE 1 TABLET (10 MG TOTAL) BY MOUTH AT BEDTIME.   pantoprazole 20 MG tablet Commonly known as:  PROTONIX Take 1 tablet (20 mg total) by mouth daily.   prenatal multivitamin Tabs tablet Take 1 tablet by mouth daily at 12 noon.       Diet: carb modified diet  Activity: Advance as tolerated. Pelvic rest for 6 weeks.   Outpatient follow up:BP check next week at [facility] Follow up Appt: Future Appointments  Date Time Provider Department Center  04/21/2017  9:30 AM Cheral Marker,  CNM [facility]  04/29/2017  1:00 PM Kerri Perches, MD [facility]   Follow up Visit:No Follow-up on file.  Postpartum contraception: Not Discussed  Newborn Data: Live born female  Birth Weight: 4 lb 13.3 oz (2190 g) APGAR: 8, 9  Newborn Delivery   Birth date/time:  04/15/2017 09:11:00 Delivery type:  Vaginal, Spontaneous     Baby Feeding: Breast Disposition:home with mother   04/17/2017 Scheryl Darter, MD

## 2017-04-17 NOTE — Progress Notes (Signed)
Mom and Baby discharge instructions reviewed.  Postpartum signs and symptoms of hypertension, medication changes, newborn care and postpartum care discussed. Paperwork signed.

## 2017-04-20 ENCOUNTER — Inpatient Hospital Stay (HOSPITAL_COMMUNITY): Payer: 59

## 2017-04-21 ENCOUNTER — Ambulatory Visit (INDEPENDENT_AMBULATORY_CARE_PROVIDER_SITE_OTHER): Payer: 59 | Admitting: Women's Health

## 2017-04-21 ENCOUNTER — Encounter: Payer: Self-pay | Admitting: Women's Health

## 2017-04-21 VITALS — BP 140/100 | HR 98 | Wt 242.0 lb

## 2017-04-21 DIAGNOSIS — O165 Unspecified maternal hypertension, complicating the puerperium: Secondary | ICD-10-CM

## 2017-04-21 DIAGNOSIS — Z0130 Encounter for examination of blood pressure without abnormal findings: Secondary | ICD-10-CM

## 2017-04-21 MED ORDER — AMLODIPINE BESYLATE 5 MG PO TABS: 5.0000 mg | ORAL_TABLET | Freq: Every day | ORAL | 1 refills | Status: DC

## 2017-04-21 MED FILL — AMLODIPINE BESYLATE 5 MG TA: 5 | 30 days supply | Qty: 30 | Fill #0

## 2017-04-21 NOTE — Progress Notes (Signed)
   GYN VISIT Patient name: [name] MRN [id number]  Date of birth: April 27, 1980 Chief Complaint:   Blood Pressure Check  History of Present Illness:   [name] is a 37 y.o. [id number] Caucasian female 6d s/p SVB after IOL for severe pre-e at 36.2wks being seen today for bp check. Received intrapartum and 24hr pp mag. PP BP's were normal in hospital, was not d/c'd on meds. Denies ha, visual changes, ruq/epigastric pain, n/v.  Breastfeeding. Still undecided about contraception     Patient's last menstrual period was 08/04/2016 (exact date). The current method of family planning is abstinence. Last pap 03/02/17. Results were:  normal Review of Systems:   Pertinent items are noted in HPI Denies fever/chills, dizziness, headaches, visual disturbances, fatigue, shortness of breath, chest pain, abdominal pain, vomiting, abnormal vaginal discharge/itching/odor/irritation, problems with periods, bowel movements, urination, or intercourse unless otherwise stated above.  Pertinent History Reviewed:  Reviewed past medical,surgical, social, obstetrical and family history.  Reviewed problem list, medications and allergies. Physical Assessment:   Vitals:   04/21/17 0935  BP: (!) 140/100  Pulse: 98  Weight: 242 lb (109.8 kg)  Body mass index is 44.26 kg/m.       Physical Examination:   General appearance: alert, well appearing, and in no distress  Mental status: alert, oriented to person, place, and time  Skin: warm & dry   Cardiovascular: normal heart rate noted  Respiratory: normal respiratory effort, no distress  Abdomen: soft, non-tender   Pelvic: examination not indicated  Extremities: no edema   No results found for this or any previous visit (from the past 24 hour(s)).  Assessment & Plan:  1) 6d s/p SVB after IOL for severe pre-e @ 36.2wks  2) Breastfeeding  3) PP HTN> rx norvasc 5mg   Meds ordered this encounter  Medications  . amLODipine (NORVASC) 5 MG tablet    Sig:  Take 1 tablet (5 mg total) by mouth daily.    Dispense:  30 tablet    Refill:  1    Order Specific Question:   Supervising Provider    Answer:   Despina HiddenEURE, LUTHER H [[id number]]    No orders of the defined types were placed in this encounter.   Return in about 2 weeks (around 05/05/2017) for F/U, bp check, then 5wks from now for pp visit.  [name], Antavious Spanos Randall CNM, [facility] 04/21/2017 10:04 AM

## 2017-04-21 NOTE — Patient Instructions (Signed)
Call the office ([phone]) [facility] for these signs of pre-ecl[facility]:  Severe headache that does not go away with Tylenol  Visual changes- seeing spots, double, blurred vision  Pain under your right breast or upper abdomen that does not go away with Tums or heartburn medicine  Nausea and/or vomiting  Severe swelling in your hands, feet, and face       

## 2017-04-22 ENCOUNTER — Other Ambulatory Visit: Payer: Self-pay | Admitting: *Deleted

## 2017-04-22 NOTE — Patient Outreach (Signed)
[facility] Care Management  04/22/2017  [name] 03-30-1981 [id number]   Subjective: Telephone call to patient's home  / mobile number, no answer, left HIPAA compliant voicemail message, and requested call back.     Objective: Per KPN (Knowledge Performance Now, point of care tool) and chart review, patient hospitalized 04/14/17 -04/17/17 for Preeclampsia and Induction of Labor With Vaginal Delivery.   Patient also has a history of gestational hypertension and gestational diabetes.    Assessment: Received [facility] Transition of care referral on 04/18/17.  Transition of care follow up pending patient contact.      Plan: RNCM will call patient for 2nd telephone outreach attempt, transition of care follow up, within 10 business days if no return call.      Aury Scollard H. Gardiner Barefootooper RN, BSN, CCM [facility] Care Management [facility] Telephonic CM Phone: [phone] Fax: [phone]

## 2017-04-24 ENCOUNTER — Encounter: Payer: Self-pay | Admitting: *Deleted

## 2017-04-24 ENCOUNTER — Ambulatory Visit: Payer: Self-pay | Admitting: *Deleted

## 2017-04-24 ENCOUNTER — Other Ambulatory Visit: Payer: Self-pay | Admitting: *Deleted

## 2017-04-24 NOTE — Patient Outreach (Signed)
[facility] Care Management  04/24/2017  Alessandra BevelsJennifer M [facility] 1980/11/22 [id number]   Subjective: Telephone call to patient's home / mobile number, spoke with patient, and HIPAA verified.  [facility] Transition of care follow up, patient voiced understanding, and is in agreement to follow up.   Patient states she is doing well, baby doing well, blood pressure elevated at OB/ GYN follow up visit on 04/21/17, blood pressure medication started, has blood pressure cuff for home monitoring, and will have a follow up with MD in 2 weeks.  States gestational diabetes has resolved.    Has follow up appointment with primary MD on 04/29/17.  Patient states she is able to manage self care and has assistance as needed. Patient voices understanding of medical diagnosis and treatment plan.   States she is accessing the following [facility] benefits: outpatient pharmacy, hospital indemnity (not a chosen benefit), and has family medical leave act Engineer, maintenance (IT)(FMLA) in place. Patient states she does not have any education material, transition of care, care coordination, disease management, disease monitoring, transportation, community resource, or pharmacy needs at this time.   States she is very appreciative of the follow up and is in agreement to receive [facility] information.     Objective: Per KPN (Knowledge Performance Now, point of care tool) and chart review, patient hospitalized 04/14/17 -04/17/17 for Preeclampsia and Induction of Labor With Vaginal Delivery.   Patient also has a history of gestational hypertension and gestational diabetes.    Assessment: Received [facility]MR Transition of care referral on 04/18/17.  Transition of care follow up completed, no care management needs, and will proceed with case closure.     Plan: RNCM will send patient successful outreach letter, [facility] pamphlet, and magnet. RNCM will send case closure due to follow up completed / no care management needs  request to Iverson AlaminLaura Greeson at [facility].      Cason Luffman H. Gardiner Barefootooper RN, BSN, CCM [facility] Care [facility] Telephonic CM Phone: [phone] Fax: [phone]

## 2017-04-29 ENCOUNTER — Ambulatory Visit: Payer: Self-pay | Admitting: Family Medicine

## 2017-05-04 ENCOUNTER — Other Ambulatory Visit: Payer: Self-pay | Admitting: Obstetrics & Gynecology

## 2017-05-05 ENCOUNTER — Ambulatory Visit (INDEPENDENT_AMBULATORY_CARE_PROVIDER_SITE_OTHER): Payer: 59 | Admitting: Women's Health

## 2017-05-05 ENCOUNTER — Encounter: Payer: Self-pay | Admitting: Women's Health

## 2017-05-05 VITALS — BP 122/98 | HR 106 | Ht 62.0 in | Wt 241.0 lb

## 2017-05-05 DIAGNOSIS — O165 Unspecified maternal hypertension, complicating the puerperium: Secondary | ICD-10-CM

## 2017-05-05 DIAGNOSIS — Z0130 Encounter for examination of blood pressure without abnormal findings: Secondary | ICD-10-CM

## 2017-05-05 NOTE — Progress Notes (Signed)
   GYN VISIT Patient name: Wanda BuccoJennifer M Andrews MRN [id number]  Date of birth: 07/12/1980 Chief Complaint:   Follow-up (BP check)  History of Present Illness:   Wanda BuccoJennifer M Andrews is a 37 y.o. [id number] Caucasian female 20d s/p SVB after IOL for severe pre-e, being seen today for bp check.  She was started on norvasc 5mg  daily at 6d pp in office. States she just took this am dose about 30mins ago. Took bp at home yesterday and was 118/74. Has actually felt at times it may be low, gets a little dizzy. Was taking metoprolol 50mg  prn prior to pregnancy for palpitations. States she had a 'bad day' the other day with the palpations, but didn't want to double up on bp meds and drop bp too low.   Patient's last menstrual period was 08/04/2016 (exact date). The current method of family planning is abstinence. Review of Systems:   Pertinent items are noted in HPI Denies fever/chills, dizziness, headaches, visual disturbances, fatigue, shortness of breath, chest pain, abdominal pain, vomiting, abnormal vaginal discharge/itching/odor/irritation, problems with periods, bowel movements, urination, or intercourse unless otherwise stated above.  Pertinent History Reviewed:  Reviewed past medical,surgical, social, obstetrical and family history.  Reviewed problem list, medications and allergies. Physical Assessment:   Vitals:   05/05/17 1051 05/05/17 1054  BP: (!) 132/96 (!) 122/98  Pulse: (!) 106   Weight: 241 lb (109.3 kg)   Height: 5\' 2"  (1.575 m)   Body mass index is 44.08 kg/m.       Physical Examination:   General appearance: alert, well appearing, and in no distress  Mental status: alert, oriented to person, place, and time  Skin: warm & dry   Cardiovascular: normal heart rate noted  Respiratory: normal respiratory effort, no distress  Abdomen: soft, non-tender   Pelvic: examination not indicated  Extremities: no edema   No results found for this or any previous visit (from the past 24 hour(s)).    Assessment & Plan:  1) 20d s/p SVB after IOL for severe pre-e  2) BP check> bp slightly elevated today, just took norvasc 5mg  ~7530mins ago. Feels bp is low at home. To check bp QID x 3d at home, if all normal or if some are low, trial stopping norvasc, and can resume metoprolol prn for palpitations. Continue checking bp's- if creep back up, resume norvasc 5mg  daily and stop 2d prior to scheduled pp visit  Meds: No orders of the defined types were placed in this encounter.   No orders of the defined types were placed in this encounter.   Return for As scheduled. 2/11 for pp visit  [name], Jaymes Hang Randall CNM, [facility]NP-BC 05/05/2017 11:20 AM

## 2017-05-05 NOTE — Patient Instructions (Signed)
Take bp's QID x 3 days, if all normal (or if some are low) come off norvasc, then take metoprolol prn for palpitations

## 2017-05-16 DIAGNOSIS — H5213 Myopia, bilateral: Secondary | ICD-10-CM | POA: Diagnosis not present

## 2017-05-16 DIAGNOSIS — H52221 Regular astigmatism, right eye: Secondary | ICD-10-CM | POA: Diagnosis not present

## 2017-05-19 MED FILL — MONTELUKAST SOD 10 MG TAB: 10 | 90 days supply | Qty: 90 | Fill #1

## 2017-05-26 ENCOUNTER — Ambulatory Visit: Payer: 59 | Admitting: Women's Health

## 2017-05-29 ENCOUNTER — Encounter: Payer: Self-pay | Admitting: Women's Health

## 2017-05-29 ENCOUNTER — Ambulatory Visit (INDEPENDENT_AMBULATORY_CARE_PROVIDER_SITE_OTHER): Payer: 59 | Admitting: Women's Health

## 2017-05-29 DIAGNOSIS — I10 Essential (primary) hypertension: Secondary | ICD-10-CM | POA: Insufficient documentation

## 2017-05-29 DIAGNOSIS — N750 Cyst of Bartholin's gland: Secondary | ICD-10-CM | POA: Insufficient documentation

## 2017-05-29 MED ORDER — NORETHINDRONE 0.35 MG PO TABS: 1.0000 | ORAL_TABLET | Freq: Every day | ORAL | 11 refills | Status: DC

## 2017-05-29 MED FILL — NORETHINDRONE 0.35 MG TAB: 0.35 | 28 days supply | Qty: 28 | Fill #0

## 2017-05-29 NOTE — Patient Instructions (Signed)
Bartholin Cyst or Abscess A Bartholin cyst is a fluid-filled sac that forms on a Bartholin gland. Bartholin glands are small glands that are located within the folds of skin (labia) along the sides of the lower opening of the vagina. These glands produce a fluid to moisten the outside of the vagina during sexual intercourse. A Bartholin cyst causes a bulge on the side of the vagina. A cyst that is not large or infected may not cause symptoms or problems. However, if the fluid within the cyst becomes infected, the cyst can turn into an abscess. An abscess may cause discomfort or pain. What are the causes? A Bartholin cyst may develop when the duct of the gland becomes blocked. In many cases, the cause of this is not known. Various kinds of bacteria can cause the cyst to become infected and develop into an abscess. What increases the risk? You may be at an increased risk of developing a Bartholin cyst or abscess if:  You are a woman of reproductive age.  You have a history of previous Bartholin cysts or abscesses.  You have diabetes.  You have a sexually transmitted disease (STD).  What are the signs or symptoms? The severity of symptoms varies depending on the size of the cyst and whether it is infected. Symptoms may include:  A bulge or swelling near the lower opening of your vagina.  Discomfort or pain.  Redness.  Pain during sexual intercourse.  Pain when walking.  Fluid draining from the area.  How is this diagnosed? Your health care provider may make a diagnosis based on your symptoms and a physical exam. He or she will look for swelling in your vaginal area. Blood tests may be done to check for infections. A sample of fluid from the cyst or abscess may also be taken to be tested in a lab. How is this treated? Small cysts that are not infected may not require any treatment. These often go away on their own. Yourhealth care provider will recommend hot baths and the use of warm  compresses. These may also be part of the treatment for an abscess. Treatment options for a large cyst or abscess may include:  Antibiotic medicine.  A surgical procedure to drain the abscess. One of the following procedures may be done: ? Incision and drainage. An incision is made in the cyst or abscess so that the fluid drains out. A catheter may be placed inside the cyst so that it does not close and fill up with fluid again. The catheter will be removed after you have a follow-up visit with a specialist (gynecologist). ? Marsupialization. The cyst or abscess is opened and kept open by stitching the edges of the skin to the walls of the cyst or abscess. This allows it to continue to drain and not fill up with fluid again.  If you have cysts or abscesses that keep returning and have required incision and drainage multiple times, your health care provider may talk to you about surgery to remove the Bartholin gland. Follow these instructions at home:  Take medicines only as directed by your health care provider.  If you were prescribed an antibiotic medicine, finish it all even if you start to feel better.  Apply warm, wet compresses to the area or take warm, shallow baths that cover your pelvic region (sitz baths) several times a day or as directed by your health care provider.  Do not squeeze the cyst or apply heavy pressure to it.    Do not have sexual intercourse until the cyst has gone away.  If your cyst or abscess was opened, a small piece of gauze or a drain may have been placed in the area to allow drainage. Do not remove the gauze or the drain until directed by your health care provider.  Wear feminine pads-not tampons-as needed for any drainage or bleeding.  Keep all follow-up visits as directed by your health care provider. This is important. How is this prevented? Take these steps to help prevent a Bartholin cyst from returning:  Practice good hygiene.  Clean your vaginal  area with mild soap and a soft cloth when you bathe.  Practice safe sex to prevent STDs.  Contact a health care provider if:  You have increased pain, swelling, or redness in the area of the cyst.  Puslike drainage is coming from the cyst.  You have a fever. This information is not intended to replace advice given to you by your health care provider. Make sure you discuss any questions you have with your health care provider. Document Released: 04/01/2005 Document Revised: 09/07/2015 Document Reviewed: 11/15/2013 Elsevier Interactive Patient Education  2018 Elsevier Inc.  

## 2017-05-29 NOTE — Progress Notes (Signed)
POSTPARTUM VISIT Patient name: [name] MRN [id number]  Date of birth: 12/08/1980 Chief Complaint:   postpartum visit (discuss birth control options)  History of Present Illness:   [name] is a 37 y.o. [phone] Caucasian female being seen today for a postpartum visit. She is 6 weeks postpartum following a spontaneous vaginal delivery at 36.2 gestational weeks after IOL for severe pre-e. Anesthesia: epidural. I have fully reviewed the prenatal and intrapartum course. [facility] leading to severe pre-e. Postpartum course has been complicated by PP HTN requiring norvasc 5mg , she stopped taking app 2-3wks after starting d/t bp's being too low. Has been checking bp's at home, hasn't had any elevated since off meds, highest diastolic was 85. Bleeding no bleeding. Bowel function is normal. Bladder function is normal.  Patient is not sexually active. Last sexual activity: prior to birth of baby.  Contraception method is wants Pharmacist, hospitalmicronor.  [place] Postpartum Depression Screening: negative. Score 7.   Last pap 03/02/17.  Results were normal .  Patient's last menstrual period was 08/04/2016 (exact date).  Baby's course has been uncomplicated. Baby is feeding by breast.  Review of Systems:   Pertinent items are noted in HPI Denies Abnormal vaginal discharge w/ itching/odor/irritation, headaches, visual changes, shortness of breath, chest pain, abdominal pain, severe nausea/vomiting, or problems with urination or bowel movements. Pertinent History Reviewed:  Reviewed past medical,surgical, obstetrical and family history.  Reviewed problem list, medications and allergies. OB History  Gravida Para Term Preterm AB Living  6 3 2 1 3 3   SAB TAB Ectopic Multiple Live Births  3     0 3    # Outcome Date GA Lbr Len/2nd Weight Sex Delivery Anes PTL Lv  6 Preterm 04/15/17 4926w2d 05:08 / 00:03 4 lb 13.3 oz (2.19 kg) F Vag-Spont EPI  LIV  5 SAB 2017          4 SAB 2010           3 Term 02/20/05 1497w5d  9 lb 2 oz (4.139 kg) F Vag-Spont EPI N LIV     Complications: Shoulder Dystocia,PIH (pregnancy induced hypertension)  2 SAB 2005          1 Term 03/16/96 4118w5d  8 lb 2 oz (3.685 kg) F Vag-Vacuum None N LIV     Complications: PIH (pregnancy induced hypertension)     Physical Assessment:   Vitals:   05/29/17 1119  BP: 120/90  Pulse: 94  Weight: 245 lb (111.1 kg)  Height: 5\' 2"  (1.575 m)  Body mass index is 44.81 kg/m.       Physical Examination:   General appearance: alert, well appearing, and in no distress  Mental status: alert, oriented to person, place, and time  Skin: warm & dry   Cardiovascular: normal heart rate noted   Respiratory: normal respiratory effort, no distress   Breasts: deferred, no complaints   Abdomen: soft, non-tender   Pelvic: VULVA: normal appearing vulva with no masses, tenderness or lesions, VAGINA: ~4cm fluctuant Lt Bartholin's cyst- not tender, co-exam w/ LHE, can I&D or not, pt reports does occ bother her- wants I&D's UTERUS: uterus is normal size, shape, consistency and nontender  Rectal: no hemorrhoids  Extremities: no edema  PROCEDURE:  Lt Bartholin's cyst cleansed w/ alcohol, 2cc of 1%lidocaine injected to produce skin wheel just distal to hymenal ring. Small stab incision w/ 11blade scalpel made, cyst began draining large amt thin brown nonodorous drainage immediately, word catheter placed and  inflated w/ 3cc lidocaine, and end placed in vagina. To remain in place x 4 weeks.  Pt tolerated procedure well       No results found for this or any previous visit (from the past 24 hour(s)).  Assessment & Plan:  1) Postpartum exam 2) 6 wks s/p SVB at 36wks after IOL for severe pre-e 3) Breastfeeding 4) Depression screening 5) Contraception counseling, pt prefers oral progesterone-only contraceptive, Rx micronor w/ 11RF, understands has to take at exact same time daily to be effective, if late taking use condom as back-up  6) Lt  Bartholin's cyst, I&D'd and word catheter placed, to remain in place x 4wks 7) BP slightly elevated today, likely 'White coat syndrome', all home bp's have been normal, pt to continue checking home bp's, if elevated let us know  Meds:  Meds ordered this encounter  Medications  . norethindrone (MICRONOR,CAMILA,ERRIN) 0.35 MG tablet    Sig: Take 1 tablet (0.35 mg total) by mouth daily.    Dispense:  1 Package    Refill:  11    Order Specific Question:   Supervising Provider    Answer:   Lazaro Arms [[id number]]   Follow-up: Return in about 4 weeks (around 06/26/2017) for F/U.   No orders of the defined types were placed in this encounter.   Cheral Marker CNM, [facility] 05/29/2017 1:44 PM

## 2017-06-19 ENCOUNTER — Ambulatory Visit: Payer: Self-pay | Admitting: Family Medicine

## 2017-06-19 ENCOUNTER — Encounter: Payer: Self-pay | Admitting: Family Medicine

## 2017-06-19 ENCOUNTER — Ambulatory Visit: Payer: 59 | Admitting: Family Medicine

## 2017-06-19 VITALS — BP 140/94 | HR 78 | Resp 16 | Ht 62.0 in | Wt 245.0 lb

## 2017-06-19 DIAGNOSIS — E785 Hyperlipidemia, unspecified: Secondary | ICD-10-CM | POA: Diagnosis not present

## 2017-06-19 DIAGNOSIS — I10 Essential (primary) hypertension: Secondary | ICD-10-CM

## 2017-06-19 DIAGNOSIS — J029 Acute pharyngitis, unspecified: Secondary | ICD-10-CM | POA: Diagnosis not present

## 2017-06-19 DIAGNOSIS — E559 Vitamin D deficiency, unspecified: Secondary | ICD-10-CM | POA: Diagnosis not present

## 2017-06-19 LAB — POCT RAPID STREP A (OFFICE): Rapid Strep A Screen: NEGATIVE

## 2017-06-19 MED ORDER — AMLODIPINE BESYLATE 5 MG PO TABS: 5.0000 mg | ORAL_TABLET | Freq: Every day | ORAL | 1 refills | Status: DC

## 2017-06-19 NOTE — Patient Instructions (Addendum)
F/U in 3rd week in April, call if  You need me before  Please resume amlodipine 15mdaily  DASH diet and commitment to daily physical activity for a minimum of 40 minutes discussed and encouraged, as a part of hypertension management. The importance of attaining a healthy weight is also discussed.  Labs today lipid, cmp and EGFr, tSH HBa1C , tSH  And vit D and will be sent on My chart  CONGRATS on Violet!!!

## 2017-06-19 NOTE — Assessment & Plan Note (Signed)
Start amlodipine 5 mg daily DASH diet and commitment to daily physical activity for a minimum of 30 minutes discussed and encouraged, as a part of hypertension management. The importance of attaining a healthy weight is also discussed.  BP/Weight 06/19/2017 05/29/2017 05/05/2017 04/21/2017 04/17/2017 04/14/2017 04/14/2017  Systolic BP - 120 122 140 132 [id number] -  Diastolic BP - 90 98 100 79 106 -  Wt. (Lbs) 245 245 241 242 258.25 259.4 -  BMI 44.81 44.81 44.08 44.26 - 47.44 47.23

## 2017-06-21 ENCOUNTER — Telehealth: Payer: 59 | Admitting: Family

## 2017-06-21 DIAGNOSIS — J028 Acute pharyngitis due to other specified organisms: Secondary | ICD-10-CM

## 2017-06-21 DIAGNOSIS — B9689 Other specified bacterial agents as the cause of diseases classified elsewhere: Secondary | ICD-10-CM

## 2017-06-21 DIAGNOSIS — H109 Unspecified conjunctivitis: Secondary | ICD-10-CM

## 2017-06-21 MED ORDER — POLYMYXIN B-TRIMETHOPRIM 10000-0.1 UNIT/ML-% OP SOLN: 2.0000 [drp] | Freq: Four times a day (QID) | OPHTHALMIC | 0 refills | Status: DC

## 2017-06-21 MED ORDER — AZITHROMYCIN 250 MG PO TABS: ORAL_TABLET | ORAL | 0 refills | Status: DC

## 2017-06-21 NOTE — Progress Notes (Signed)
Thank you for the details you included in the comment boxes. Those details are very helpful in determining the best course of treatment for you and help us to provide the best care. Thank you for the details in the phone call. The drops are for both eyes in case it spreads on the pillow while sleeping I hope you feel better soon!  We are sorry that you are not feeling well.  Here is how we plan to help!  Based on what you have shared with me it looks like you have conjunctivitis.  Conjunctivitis is a common inflammatory or infectious condition of the eye that is often referred to as "pink eye".  In most cases it is contagious (viral or bacterial). However, not all conjunctivitis requires antibiotics (ex. Allergic).  We have made appropriate suggestions for you based upon your presentation.  I have prescribed Polytrim Ophthalmic drops 2 drops 4 times a day times 5 days  Pink eye can be highly contagious.  It is typically spread through direct contact with secretions, or contaminated objects or surfaces that one may have touched.  Strict handwashing is suggested with soap and water is urged.  If not available, use alcohol based had sanitizer.  Avoid unnecessary touching of the eye.  If you wear contact lenses, you will need to refrain from wearing them until you see no white discharge from the eye for at least 24 hours after being on medication.  You should see symptom improvement in 1-2 days after starting the medication regimen.  Call us if symptoms are not improved in 1-2 days.  Home Care:  Wash your hands often!  Do not wear your contacts until you complete your treatment plan.  Avoid sharing towels, bed linen, personal items with a person who has pink eye.  See attention for anyone in your home with similar symptoms.  Get Help Right Away If:  Your symptoms do not improve.  You develop blurred or loss of vision.  Your symptoms worsen (increased discharge, pain or redness)  Your e-visit  answers were reviewed by a board certified advanced clinical practitioner to complete your personal care plan.  Depending on the condition, your plan could have included both over the counter or prescription medications.  If there is a problem please reply  once you have received a response from your provider.  Your safety is important to us.  If you have drug allergies check your prescription carefully.    You can use MyChart to ask questions about today's visit, request a non-urgent call back, or ask for a work or school excuse for 24 hours related to this e-Visit. If it has been greater than 24 hours you will need to follow up with your provider, or enter a new e-Visit to address those concerns.   You will get an e-mail in the next two days asking about your experience.  I hope that your e-visit has been valuable and will speed your recovery. Thank you for using e-visits.

## 2017-06-21 NOTE — Progress Notes (Signed)
Thank you for the details you included in the comment boxes. Those details are very helpful in determining the best course of treatment for you and help us to provide the best care.  We are sorry that you are not feeling well.  Here is how we plan to help!  Based on your presentation I believe you most likely have A cough due to bacteria.  When patients have a fever and a productive cough with a change in color or increased sputum production, we are concerned about bacterial bronchitis.  If left untreated it can progress to pneumonia.  If your symptoms do not improve with your treatment plan it is important that you contact your provider.   I have prescribed Azithromyin 250 mg: two tablets now and then one tablet daily for 4 additonal days    In addition you may use Delsym Cough Syrup (over the counter) as you are breastfeeding.    From your responses in the eVisit questionnaire you describe inflammation in the upper respiratory tract which is causing a significant cough.  This is commonly called Bronchitis and has four common causes:    Allergies  Viral Infections  Acid Reflux  Bacterial Infection Allergies, viruses and acid reflux are treated by controlling symptoms or eliminating the cause. An example might be a cough caused by taking certain blood pressure medications. You stop the cough by changing the medication. Another example might be a cough caused by acid reflux. Controlling the reflux helps control the cough.  USE OF BRONCHODILATOR ("RESCUE") INHALERS: There is a risk from using your bronchodilator too frequently.  The risk is that over-reliance on a medication which only relaxes the muscles surrounding the breathing tubes can reduce the effectiveness of medications prescribed to reduce swelling and congestion of the tubes themselves.  Although you feel brief relief from the bronchodilator inhaler, your asthma may actually be worsening with the tubes becoming more swollen and filled  with mucus.  This can delay other crucial treatments, such as oral steroid medications. If you need to use a bronchodilator inhaler daily, several times per day, you should discuss this with your provider.  There are probably better treatments that could be used to keep your asthma under control.     HOME CARE . Only take medications as instructed by your medical team. . Complete the entire course of an antibiotic. . Drink plenty of fluids and get plenty of rest. . Avoid close contacts especially the very young and the elderly . Cover your mouth if you cough or cough into your sleeve. . Always remember to wash your hands . A steam or ultrasonic humidifier can help congestion.   GET HELP RIGHT AWAY IF: . You develop worsening fever. . You become short of breath . You cough up blood. . Your symptoms persist after you have completed your treatment plan MAKE SURE YOU   Understand these instructions.  Will watch your condition.  Will get help right away if you are not doing well or get worse.  Your e-visit answers were reviewed by a board certified advanced clinical practitioner to complete your personal care plan.  Depending on the condition, your plan could have included both over the counter or prescription medications. If there is a problem please reply  once you have received a response from your provider. Your safety is important to us.  If you have drug allergies check your prescription carefully.    You can use MyChart to ask questions about today's visit,  request a non-urgent call back, or ask for a work or school excuse for 24 hours related to this e-Visit. If it has been greater than 24 hours you will need to follow up with your provider, or enter a new e-Visit to address those concerns. You will get an e-mail in the next two days asking about your experience.  I hope that your e-visit has been valuable and will speed your recovery. Thank you for using e-visits.

## 2017-06-22 ENCOUNTER — Encounter: Payer: Self-pay | Admitting: Family Medicine

## 2017-06-22 DIAGNOSIS — J029 Acute pharyngitis, unspecified: Secondary | ICD-10-CM | POA: Insufficient documentation

## 2017-06-22 NOTE — Progress Notes (Signed)
   Wanda BuccoJennifer M Andrews     MRN: [id number]      DOB: [birth year]   HPI [name] is here for follow up she is approximally 10 weeks post partum and her pregnancy was complicated by pre eclampsia. She was treated for hypertension late in her pregnancy and was subsequently weaned off of medication, her at home values are normal. Generally doing wel C/o sore throat x 2 days , denies fever , chills or sputum production ROS Denies recent fever or chills. Denies sinus pressure, nasal congestion, ear pain  Denies chest congestion, productive cough or wheezing. Denies chest pains, palpitations and leg swelling Denies abdominal pain, nausea, vomiting,diarrhea or constipation.   Denies dysuria, frequency, hesitancy or incontinence. Denies joint pain, swelling and limitation in mobility. Denies headaches, seizures, numbness, or tingling. Denies depression, anxiety or insomnia. Denies skin break down or rash.   PE  BP (!) 140/94   Pulse 78   Resp 16   Ht 5\' 2"  (1.575 m)   Wt 245 lb (111.1 kg)   SpO2 98%   BMI 44.81 kg/m   Patient alert and oriented and in no cardiopulmonary distress.  HEENT: No facial asymmetry, EOMI,   oropharynx pink and moist.  No erythema or exudate Neck supple no JVD, no adenopathy no mass.  Chest: Clear to auscultation bilaterally.  CVS: S1, S2 no murmurs, no S3.Regular rate.  ABD: Soft non tender.   Ext: No edema  MS: Adequate ROM spine, shoulders, hips and knees.  Skin: Intact, no ulcerations or rash noted.  Psych: Good eye contact, normal affect. Memory intact not anxious or depressed appearing.  CNS: CN 2-12 intact, power,  normal throughout.no focal deficits noted.   Assessment & Plan  Essential hypertension Start amlodipine 5 mg daily DASH diet and commitment to daily physical activity for a minimum of 30 minutes discussed and encouraged, as a part of hypertension management. The importance of attaining a healthy weight is also  discussed.  BP/Weight 06/19/2017 05/29/2017 05/05/2017 04/21/2017 04/17/2017 04/14/2017 04/14/2017  Systolic BP - 120 122 140 132 [id number] -  Diastolic BP - 90 98 100 79 106 -  Wt. (Lbs) 245 245 241 242 258.25 259.4 -  BMI 44.81 44.81 44.08 44.26 - 47.44 47.23       Sore throat Symptomatic x 1 day, and exam is normal. Pt has young premature infant  Rapid strep test is negative No antibiotic prescribed  Morbid obesity (HCC) Stable post partum, will work on lifestyle change for weight loss Patient re-educated about  the importance of commitment to a  minimum of 150 minutes of exercise per week.  The importance of healthy food choices with portion control discussed. Encouraged to start a food diary, count calories and to consider  joining a support group. Sample diet sheets offered. Goals set by the patient for the next several months.   Weight /BMI 06/19/2017 05/29/2017 05/05/2017  WEIGHT 245 lb 245 lb 241 lb  HEIGHT 5\' 2"  5\' 2"  5\' 2"   BMI 44.81 kg/m2 44.81 kg/m2 44.08 kg/m2      Hyperlipidemia Updated lab needed

## 2017-06-22 NOTE — Assessment & Plan Note (Signed)
Updated lab needed.  

## 2017-06-22 NOTE — Assessment & Plan Note (Signed)
Stable post partum, will work on lifestyle change for weight loss Patient re-educated about  the importance of commitment to a  minimum of 150 minutes of exercise per week.  The importance of healthy food choices with portion control discussed. Encouraged to start a food diary, count calories and to consider  joining a support group. Sample diet sheets offered. Goals set by the patient for the next several months.   Weight /BMI 06/19/2017 05/29/2017 05/05/2017  WEIGHT 245 lb 245 lb 241 lb  HEIGHT 5\' 2"  5\' 2"  5\' 2"   BMI 44.81 kg/m2 44.81 kg/m2 44.08 kg/m2

## 2017-06-22 NOTE — Assessment & Plan Note (Signed)
Symptomatic x 1 day, and exam is normal. Pt has young premature infant  Rapid strep test is negative No antibiotic prescribed

## 2017-06-26 ENCOUNTER — Ambulatory Visit (INDEPENDENT_AMBULATORY_CARE_PROVIDER_SITE_OTHER): Payer: 59 | Admitting: Women's Health

## 2017-06-26 ENCOUNTER — Encounter: Payer: Self-pay | Admitting: Women's Health

## 2017-06-26 VITALS — BP 130/90 | HR 94 | Wt 246.8 lb

## 2017-06-26 DIAGNOSIS — N750 Cyst of Bartholin's gland: Secondary | ICD-10-CM

## 2017-06-26 DIAGNOSIS — Z3009 Encounter for other general counseling and advice on contraception: Secondary | ICD-10-CM

## 2017-06-26 NOTE — Patient Instructions (Signed)
Tips To Increase Milk Supply  Lots of water! Enough so that your urine is clear  Plenty of calories, if you're not getting enough calories, your milk supply can decrease  Breastfeed/pump often, every 2-3 hours x 20-30mins  Fenugreek 3 pills 3 times a day, this may make your urine smell like maple syrup  Mother's Milk Tea  Lactation cookies, google for the recipe  Real oatmeal   

## 2017-06-26 NOTE — Progress Notes (Signed)
   GYN VISIT Patient name: Wanda BuccoJennifer M Padron MRN [id number]  Date of birth: Feb 24, 1981 Chief Complaint:   Follow-up (I& D bartholin cyst)  History of Present Illness:   [name] is a 37 y.o. [phone] Caucasian female being seen today for f/u of I&D and Word catheter placement of Lt Bartholin's cyst on 05/29/17.  No problems since insertion.  BP elevated again today, has still been normal at home, went to PCP recently and was elevated there- she wants her to restart norvasc- pt is hesitant d/t normal and sometimes low bp's at home.   No LMP recorded. The current method of family planning is micronor, but keeps forgetting to take-wants IUD. No sex yet since birth of baby Last pap 03/02/17. Results were:  normal Review of Systems:   Pertinent items are noted in HPI Denies fever/chills, dizziness, headaches, visual disturbances, fatigue, shortness of breath, chest pain, abdominal pain, vomiting, abnormal vaginal discharge/itching/odor/irritation, problems with periods, bowel movements, urination, or intercourse unless otherwise stated above.  Pertinent History Reviewed:  Reviewed past medical,surgical, social, obstetrical and family history.  Reviewed problem list, medications and allergies. Physical Assessment:   Vitals:   06/26/17 0849  BP: 130/90  Pulse: 94  Weight: 246 lb 12.8 oz (111.9 kg)  Body mass index is 45.14 kg/m.       Physical Examination:   General appearance: alert, well appearing, and in no distress  Mental status: alert, oriented to person, place, and time  Skin: warm & dry   Cardiovascular: normal heart rate noted  Respiratory: normal respiratory effort, no distress  Abdomen: soft, non-tender   Pelvic: fluid aspirated from Word catheter and catheter removed w/o difficulty, site looks well healed  Extremities: no edema   No results found for this or any previous visit (from the past 24 hour(s)).  Assessment & Plan:  1) Word catheter removal, s/p I&D of  Bartholin's cyst 2/14> site well healed  2) Contraception counseling> wants IUD, abstinence until after insertion  3) Mildy elevated bp> now being managed by PCP  Meds: No orders of the defined types were placed in this encounter.   No orders of the defined types were placed in this encounter.   Return for 1st available for Liletta IUD insertion.  Cheral MarkerKimberly R Cleo Villamizar CNM, [facility] 06/26/2017 1:13 PM

## 2017-07-04 ENCOUNTER — Encounter: Payer: Self-pay | Admitting: Women's Health

## 2017-07-04 ENCOUNTER — Ambulatory Visit: Payer: 59 | Admitting: Women's Health

## 2017-07-04 VITALS — BP 110/80 | HR 98 | Ht 62.0 in | Wt 245.0 lb

## 2017-07-04 DIAGNOSIS — Z30430 Encounter for insertion of intrauterine contraceptive device: Secondary | ICD-10-CM

## 2017-07-04 DIAGNOSIS — Z3202 Encounter for pregnancy test, result negative: Secondary | ICD-10-CM | POA: Diagnosis not present

## 2017-07-04 LAB — POCT URINE PREGNANCY: Preg Test, Ur: NEGATIVE

## 2017-07-04 MED ORDER — LEVONORGESTREL 19.5 MCG/DAY IU IUD
INTRAUTERINE_SYSTEM | Freq: Once | INTRAUTERINE | Status: AC
  Administered 2017-07-04: 10:00:00 via INTRAUTERINE

## 2017-07-04 NOTE — Progress Notes (Signed)
   IUD INSERTION Patient name: [name] MRN [id number]  Date of birth: 19-May-1980 Subjective Findings:   [name] is a 37 y.o. [phone] Caucasian female being seen today for insertion of a Liletta IUD.   No LMP recorded. Last sexual intercourse was prior to birth of baby Last pap11/18/18. Results were:  normal  The risks and benefits of the method and placement have been thouroughly reviewed with the patient and all questions were answered.  Specifically the patient is aware of failure rate of 04/998, expulsion of the IUD and of possible perforation.  The patient is aware of irregular bleeding due to the method and understands the incidence of irregular bleeding diminishes with time.  Signed copy of informed consent in chart.  Pertinent History Reviewed:   Reviewed past medical,surgical, social, obstetrical and family history.  Reviewed problem list, medications and allergies. Objective Findings & Procedure:   Vitals:   07/04/17 0852  BP: 110/80  Pulse: 98  Weight: 245 lb (111.1 kg)  Height: 5\' 2"  (1.575 m)  Body mass index is 44.81 kg/m.  Results for orders placed or performed in visit on 07/04/17 (from the past 24 hour(s))  POCT urine pregnancy   Collection Time: 07/04/17  8:57 AM  Result Value Ref Range   Preg Test, Ur Negative Negative     Time out was performed.  A graves speculum was placed in the vagina.  The cervix was visualized, prepped using Betadine, and grasped with a single tooth tenaculum. The uterus was found to be neutral and it sounded to 8 cm.  Liletta IUD placed per manufacturer's recommendations. The strings were trimmed to approximately 3 cm. The patient tolerated the procedure well.   Informal transvaginal sonogram was performed and the proper placement of the IUD was verified. Assessment & Plan:   1) Liletta IUD insertion The patient was given post procedure instructions, including signs and symptoms of infection and to check for the  strings after each menses or each month, and refraining from intercourse or anything in the vagina for 3 days. She was given a Liletta care card with date IUD placed, and date IUD to be removed. She is scheduled for a f/u appointment in 4 weeks.  Orders Placed This Encounter  Procedures  . POCT urine pregnancy    Return in about 1 month (around 08/01/2017) for F/U.  Cheral MarkerKimberly R Giovanie Lefebre CNM, [facility] 07/04/2017 9:39 AM

## 2017-07-04 NOTE — Patient Instructions (Signed)
 Nothing in vagina for 3 days (no sex, douching, tampons, etc...)  Check your strings once a month to make sure you can feel them, if you are not able to please let us know  If you develop a fever of 100.4 or more in the next few weeks, or if you develop severe abdominal pain, please let us know  Use a backup method of birth control, such as condoms, for 2 weeks    Intrauterine Device Insertion, Care After This sheet gives you information about how to care for yourself after your procedure. Your health care provider may also give you more specific instructions. If you have problems or questions, contact your health care provider. What can I expect after the procedure? After the procedure, it is common to have:  Cramps and pain in the abdomen.  Light bleeding (spotting) or heavier bleeding that is like your menstrual period. This may last for up to a few days.  Lower back pain.  Dizziness.  Headaches.  Nausea.  Follow these instructions at home:  Before resuming sexual activity, check to make sure that you can feel the IUD string(s). You should be able to feel the end of the string(s) below the opening of your cervix. If your IUD string is in place, you may resume sexual activity. ? If you had a hormonal IUD inserted more than 7 days after your most recent period started, you will need to use a backup method of birth control for 7 days after IUD insertion. Ask your health care provider whether this applies to you.  Continue to check that the IUD is still in place by feeling for the string(s) after every menstrual period, or once a month.  Take over-the-counter and prescription medicines only as told by your health care provider.  Do not drive or use heavy machinery while taking prescription pain medicine.  Keep all follow-up visits as told by your health care provider. This is important. Contact a health care provider if:  You have bleeding that is heavier or lasts longer than  a normal menstrual cycle.  You have a fever.  You have cramps or abdominal pain that get worse or do not get better with medicine.  You develop abdominal pain that is new or is not in the same area of earlier cramping and pain.  You feel lightheaded or weak.  You have abnormal or bad-smelling discharge from your vagina.  You have pain during sexual activity.  You have any of the following problems with your IUD string(s): ? The string bothers or hurts you or your sexual partner. ? You cannot feel the string. ? The string has gotten longer.  You can feel the IUD in your vagina.  You think you may be pregnant, or you miss your menstrual period.  You think you may have an STI (sexually transmitted infection). Get help right away if:  You have flu-like symptoms.  You have a fever and chills.  You can feel that your IUD has slipped out of place. Summary  After the procedure, it is common to have cramps and pain in the abdomen. It is also common to have light bleeding (spotting) or heavier bleeding that is like your menstrual period.  Continue to check that the IUD is still in place by feeling for the string(s) after every menstrual period, or once a month.  Keep all follow-up visits as told by your health care provider. This is important.  Contact your health care provider if   you have problems with your IUD string(s), such as the string getting longer or bothering you or your sexual partner. This information is not intended to replace advice given to you by your health care provider. Make sure you discuss any questions you have with your health care provider. Document Released: 11/28/2010 Document Revised: 02/21/2016 Document Reviewed: 02/21/2016 Elsevier Interactive Patient Education  2017 [employer]  Levonorgestrel intrauterine device (IUD) What is this medicine? LEVONORGESTREL IUD (LEE voe nor jes trel) is a contraceptive (birth control) device. The device is placed  inside the uterus by a healthcare professional. It is used to prevent pregnancy. This device can also be used to treat heavy bleeding that occurs during your period. This medicine may be used for other purposes; ask your health care provider or pharmacist if you have questions. COMMON BRAND NAME(S): Kyleena, LILETTA, Mirena, Skyla What should I tell my health care provider before I take this medicine? They need to know if you have any of these conditions: -abnormal Pap smear -cancer of the breast, uterus, or cervix -diabetes -endometritis -genital or pelvic infection now or in the past -have more than one sexual partner or your partner has more than one partner -heart disease -history of an ectopic or tubal pregnancy -immune system problems -IUD in place -liver disease or tumor -problems with blood clots or take blood-thinners -seizures -use intravenous drugs -uterus of unusual shape -vaginal bleeding that has not been explained -an unusual or allergic reaction to levonorgestrel, other hormones, silicone, or polyethylene, medicines, foods, dyes, or preservatives -pregnant or trying to get pregnant -breast-feeding How should I use this medicine? This device is placed inside the uterus by a health care professional. Talk to your pediatrician regarding the use of this medicine in children. Special care may be needed. Overdosage: If you think you have taken too much of this medicine contact a poison control center or emergency room at once. NOTE: This medicine is only for you. Do not share this medicine with others. What if I miss a dose? This does not apply. Depending on the brand of device you have inserted, the device will need to be replaced every 3 to 5 years if you wish to continue using this type of birth control. What may interact with this medicine? Do not take this medicine with any of the following medications: -amprenavir -bosentan -fosamprenavir This medicine may also  interact with the following medications: -aprepitant -armodafinil -barbiturate medicines for inducing sleep or treating seizures -bexarotene -boceprevir -griseofulvin -medicines to treat seizures like carbamazepine, ethotoin, felbamate, oxcarbazepine, phenytoin, topiramate -modafinil -pioglitazone -rifabutin -rifampin -rifapentine -some medicines to treat HIV infection like atazanavir, efavirenz, indinavir, lopinavir, nelfinavir, tipranavir, ritonavir -St. John's wort -warfarin This list may not describe all possible interactions. Give your health care provider a list of all the medicines, herbs, non-prescription drugs, or dietary supplements you use. Also tell them if you smoke, drink alcohol, or use illegal drugs. Some items may interact with your medicine. What should I watch for while using this medicine? Visit your doctor or health care professional for regular check ups. See your doctor if you or your partner has sexual contact with others, becomes HIV positive, or gets a sexual transmitted disease. This product does not protect you against HIV infection (AIDS) or other sexually transmitted diseases. You can check the placement of the IUD yourself by reaching up to the top of your vagina with clean fingers to feel the threads. Do not pull on the threads. It is a good habit   to check placement after each menstrual period. Call your doctor right away if you feel more of the IUD than just the threads or if you cannot feel the threads at all. The IUD may come out by itself. You may become pregnant if the device comes out. If you notice that the IUD has come out use a backup birth control method like condoms and call your health care provider. Using tampons will not change the position of the IUD and are okay to use during your period. This IUD can be safely scanned with magnetic resonance imaging (MRI) only under specific conditions. Before you have an MRI, tell your healthcare provider that  you have an IUD in place, and which type of IUD you have in place. What side effects may I notice from receiving this medicine? Side effects that you should report to your doctor or health care professional as soon as possible: -allergic reactions like skin rash, itching or hives, swelling of the face, lips, or tongue -fever, flu-like symptoms -genital sores -high blood pressure -no menstrual period for 6 weeks during use -pain, swelling, warmth in the leg -pelvic pain or tenderness -severe or sudden headache -signs of pregnancy -stomach cramping -sudden shortness of breath -trouble with balance, talking, or walking -unusual vaginal bleeding, discharge -yellowing of the eyes or skin Side effects that usually do not require medical attention (report to your doctor or health care professional if they continue or are bothersome): -acne -breast pain -change in sex drive or performance -changes in weight -cramping, dizziness, or faintness while the device is being inserted -headache -irregular menstrual bleeding within first 3 to 6 months of use -nausea This list may not describe all possible side effects. Call your doctor for medical advice about side effects. You may report side effects to FDA at 1-800-FDA-1088. Where should I keep my medicine? This does not apply. NOTE: This sheet is a summary. It may not cover all possible information. If you have questions about this medicine, talk to your doctor, pharmacist, or health care provider.  2018 Elsevier/Gold Standard (2016-01-12 14:14:56)   

## 2017-07-04 NOTE — Addendum Note (Signed)
Addended by: Federico FlakeNES, PEGGY A on: 07/04/2017 10:05 AM   Modules accepted: Orders

## 2017-07-09 DIAGNOSIS — Z3483 Encounter for supervision of other normal pregnancy, third trimester: Secondary | ICD-10-CM | POA: Diagnosis not present

## 2017-07-09 DIAGNOSIS — Z3482 Encounter for supervision of other normal pregnancy, second trimester: Secondary | ICD-10-CM | POA: Diagnosis not present

## 2017-07-22 MED FILL — AMLODIPINE BESYLATE 5 MG TA: 5 | 30 days supply | Qty: 30 | Fill #1

## 2017-07-31 ENCOUNTER — Ambulatory Visit: Payer: 59 | Admitting: Women's Health

## 2017-07-31 ENCOUNTER — Encounter: Payer: Self-pay | Admitting: Women's Health

## 2017-07-31 VITALS — BP 130/78 | HR 84 | Ht 62.0 in | Wt 246.0 lb

## 2017-07-31 DIAGNOSIS — Z30431 Encounter for routine checking of intrauterine contraceptive device: Secondary | ICD-10-CM

## 2017-07-31 NOTE — Progress Notes (Signed)
   GYN VISIT Patient name: Wanda BuccoJennifer M Bridgers MRN [id number]  Date of birth: 1980/09/03 Chief Complaint:   Follow-up (IUD Check)  History of Present Illness:   [name] is a 37 y.o. [id number] Caucasian female being seen today for IUD check. Mirena inserted 07/04/17. Able to feel strings, no bleeding, no pain w/ sex.   No LMP recorded. (Menstrual status: IUD). The current method of family planning is IUD. Last pap 03/02/17. Results were:  normal Review of Systems:   Pertinent items are noted in HPI Denies fever/chills, dizziness, headaches, visual disturbances, fatigue, shortness of breath, chest pain, abdominal pain, vomiting, abnormal vaginal discharge/itching/odor/irritation, problems with periods, bowel movements, urination, or intercourse unless otherwise stated above.  Pertinent History Reviewed:  Reviewed past medical,surgical, social, obstetrical and family history.  Reviewed problem list, medications and allergies. Physical Assessment:   Vitals:   07/31/17 0918  BP: 130/78  Pulse: 84  Weight: 246 lb (111.6 kg)  Height: 5\' 2"  (1.575 m)  Body mass index is 44.99 kg/m.       Physical Examination:   General appearance: alert, well appearing, and in no distress  Mental status: alert, oriented to person, place, and time  Skin: warm & dry   Cardiovascular: normal heart rate noted  Respiratory: normal respiratory effort, no distress  Abdomen: soft, non-tender   Pelvic: VULVA: normal appearing vulva with no masses, tenderness or lesions, VAGINA: normal appearing vagina with normal color and discharge, no lesions, CERVIX: normal appearing cervix without discharge or lesions, strings visible, appropriate length  Extremities: no edema   No results found for this or any previous visit (from the past 24 hour(s)).  Assessment & Plan:  1) IUD check> in position  Meds: No orders of the defined types were placed in this encounter.   No orders of the defined types were placed in  this encounter.   Return in about 1 year (around 08/01/2018) for Physical.  Cheral MarkerKimberly R Ezana Hubbert CNM, [facility] 07/31/2017 9:37 AM

## 2017-08-07 ENCOUNTER — Ambulatory Visit: Payer: 59 | Admitting: Family Medicine

## 2017-08-28 ENCOUNTER — Other Ambulatory Visit: Payer: Self-pay | Admitting: Women's Health

## 2017-08-28 MED ORDER — VALACYCLOVIR HCL 1 G PO TABS: 2000.0000 mg | ORAL_TABLET | Freq: Two times a day (BID) | ORAL | 6 refills | Status: DC

## 2017-08-28 MED FILL — valACYclovir HCL 1 GM TABS: 1 | 1 days supply | Qty: 4 | Fill #0

## 2017-08-28 NOTE — Progress Notes (Signed)
Pt called, has fever blister, rx valtrex 2gm bid x 1d w/ 6RF.  Cheral Marker, CNM, [facility] 08/28/2017 10:15 AM

## 2017-09-04 DIAGNOSIS — Z3482 Encounter for supervision of other normal pregnancy, second trimester: Secondary | ICD-10-CM | POA: Diagnosis not present

## 2017-09-04 DIAGNOSIS — Z3483 Encounter for supervision of other normal pregnancy, third trimester: Secondary | ICD-10-CM | POA: Diagnosis not present

## 2017-12-24 ENCOUNTER — Ambulatory Visit: Payer: 59 | Admitting: Family Medicine

## 2018-02-13 ENCOUNTER — Telehealth: Payer: 59 | Admitting: Family

## 2018-02-13 ENCOUNTER — Other Ambulatory Visit: Payer: Self-pay | Admitting: Women's Health

## 2018-02-13 DIAGNOSIS — R21 Rash and other nonspecific skin eruption: Secondary | ICD-10-CM | POA: Diagnosis not present

## 2018-02-13 MED ORDER — FLUCONAZOLE 150 MG PO TABS: 150.0000 mg | ORAL_TABLET | Freq: Once | ORAL | 0 refills | Status: AC

## 2018-02-13 MED ORDER — GERHARDT'S BUTT CREAM: 1.0000 "application " | TOPICAL_CREAM | Freq: Two times a day (BID) | CUTANEOUS | 2 refills | Status: DC

## 2018-02-13 NOTE — Progress Notes (Signed)
Thank you for the details you included in the comment boxes. Those details are very helpful in determining the best course of treatment for you and help [place] to provide the best care.As below, you want to let the cream soak in for about 2 hours, then wipe it off before nursing. Also, you should AIM to get some nystatin mouthwash for your infant as reinfection may continue to occur. I presume you are already doing this.   E Visit for Rash  We are sorry that you are not feeling well. Here is how we plan to help!    Based upon your presentation it appears you have a fungal infection.  I have prescribed: and Nystatin cream apply to the affected area twice daily   HOME CARE:   Take cool showers and avoid direct sunlight.  Apply cool compress or wet dressings.  Take a bath in an oatmeal bath.  Sprinkle content of one Aveeno packet under running faucet with comfortably warm water.  Bathe for 15-20 minutes, 1-2 times daily.  Pat dry with a towel. Do not rub the rash.  Use hydrocortisone cream.  Take an antihistamine like Benadryl for widespread rashes that itch.  The adult dose of Benadryl is 25-50 mg by mouth 4 times daily.  Caution:  This type of medication may cause sleepiness.  Do not drink alcohol, drive, or operate dangerous machinery while taking antihistamines.  Do not take these medications if you have prostate enlargement.  Read package instructions thoroughly on all medications that you take.  GET HELP RIGHT AWAY IF:   Symptoms don't go away after treatment.  Severe itching that persists.  If you rash spreads or swells.  If you rash begins to smell.  If it blisters and opens or develops a yellow-brown crust.  You develop a fever.  You have a sore throat.  You become short of breath.  MAKE SURE YOU:  Understand these instructions. Will watch your condition. Will get help right away if you are not doing well or get worse.  Thank you for choosing an e-visit. Your  e-visit answers were reviewed by a board certified advanced clinical practitioner to complete your personal care plan. Depending upon the condition, your plan could have included both over the counter or prescription medications. Please review your pharmacy choice. Be sure that the pharmacy you have chosen is open so that you can pick up your prescription now.  If there is a problem you may message your provider in MyChart to have the prescription routed to another pharmacy. Your safety is important to [place]. If you have drug allergies check your prescription carefully.  For the next 24 hours, you can use MyChart to ask questions about today's visit, request a non-urgent call back, or ask for a work or school excuse from your e-visit provider. You will get an email in the next two days asking about your experience. I hope that your e-visit has been valuable and will speed your recovery.

## 2018-02-13 NOTE — Progress Notes (Signed)
Called pt 4 times and no answer. Left message so pt would call back and we could educate pt on how to care for this. Epic defaulted Nystatin cream to "Gerhardt's butt cream" or something else. Discontinued and called pharmacy to verbally give Nystatin 100k units/ml and BID application. +2 refills. Pharmacy notified that Joellyn Haff called in Diflucan tablets, which have limited data regarding safety profiles during breastfeeding, although prescribed often as the neonatal lactation transmission is typically below the neonatal PO dosage. In any case, a conversation with the patient was not possible due to not return email or phone call. Continued original plan. Joellyn Haff listed an "orders only" note which was likely due to pt contacting her rather than our team.  Constance Haw, DNP, FNP-BC [facility] E-Visit Team

## 2018-05-22 ENCOUNTER — Encounter: Payer: Self-pay | Admitting: Family Medicine

## 2018-05-25 ENCOUNTER — Other Ambulatory Visit: Payer: Self-pay | Admitting: Family Medicine

## 2018-05-25 MED ORDER — OSELTAMIVIR PHOSPHATE 75 MG PO CAPS: 75.0000 mg | ORAL_CAPSULE | Freq: Two times a day (BID) | ORAL | 0 refills | Status: DC

## 2018-05-25 NOTE — Progress Notes (Signed)
tamiflu

## 2018-05-26 ENCOUNTER — Ambulatory Visit (INDEPENDENT_AMBULATORY_CARE_PROVIDER_SITE_OTHER): Payer: 59 | Admitting: Family Medicine

## 2018-05-26 ENCOUNTER — Encounter: Payer: Self-pay | Admitting: Family Medicine

## 2018-05-26 VITALS — BP 120/88 | HR 88 | Resp 12 | Ht 62.0 in | Wt 240.0 lb

## 2018-05-26 DIAGNOSIS — E785 Hyperlipidemia, unspecified: Secondary | ICD-10-CM | POA: Diagnosis not present

## 2018-05-26 DIAGNOSIS — J309 Allergic rhinitis, unspecified: Secondary | ICD-10-CM | POA: Diagnosis not present

## 2018-05-26 DIAGNOSIS — R7301 Impaired fasting glucose: Secondary | ICD-10-CM

## 2018-05-26 DIAGNOSIS — Z13220 Encounter for screening for lipoid disorders: Secondary | ICD-10-CM

## 2018-05-26 DIAGNOSIS — E559 Vitamin D deficiency, unspecified: Secondary | ICD-10-CM | POA: Diagnosis not present

## 2018-05-26 MED ORDER — ALBUTEROL SULFATE HFA 108 (90 BASE) MCG/ACT IN AERS: 2.0000 | INHALATION_SPRAY | Freq: Four times a day (QID) | RESPIRATORY_TRACT | 0 refills | Status: DC | PRN

## 2018-05-26 NOTE — Patient Instructions (Addendum)
F/U in 6 months, call if you need me sooner  CBC, lipid, cmp and EGFr, HBA1C, TSH and vit D asap  Albuterol is prescribed  It is important that you exercise regularly at least 30 minutes 5 times a week. If you develop chest pain, have severe difficulty breathing, or feel very tired, stop exercising immediately and seek medical attention   Mindful eating Think about what you will eat, plan ahead. Choose " clean, green, fresh or frozen" over canned, processed or packaged foods which are more sugary, salty and fatty. 70 to 75% of food eaten should be vegetables and fruit. Three meals at set times with snacks allowed between meals, but they must be fruit or vegetables. Aim to eat over a 12 hour period , example 7 am to 7 pm, and STOP after  your last meal of the day. Drink water,generally about 64 ounces per day, no other drink is as healthy. Fruit juice is best enjoyed in a healthy way, by EATING the fruit.   pls call for pap  Congrats on your 3 miracles!  Thank you  for choosing [facility]. We consider it a privelige to serve you.  Delivering excellent health care in a caring and  compassionate way is our goal.  Partnering with you,  so that together we can achieve this goal is our strategy.

## 2018-05-29 ENCOUNTER — Encounter: Payer: Self-pay | Admitting: Family Medicine

## 2018-05-29 DIAGNOSIS — E559 Vitamin D deficiency, unspecified: Secondary | ICD-10-CM | POA: Insufficient documentation

## 2018-05-29 DIAGNOSIS — J45909 Unspecified asthma, uncomplicated: Secondary | ICD-10-CM | POA: Insufficient documentation

## 2018-05-29 NOTE — Assessment & Plan Note (Signed)
Updated lab needed at/ before next visit.   

## 2018-05-29 NOTE — Assessment & Plan Note (Signed)
No current flare, will use OTC medication if needed, currently breast feeding

## 2018-05-29 NOTE — Progress Notes (Signed)
   [name]     MRN: [id number]      DOB: [birth year]   HPI [name] is here for follow up and re-evaluation of chronic medical conditions, medication management and review of any available recent lab and radiology data.  Preventive health is updated, specifically  Cancer screening and Immunization.   Questions or concerns regarding consultations or procedures which the PT has had in the interim are  addressed. The PT no longer takes any antihypertensive medication There are no new concerns.  There are no specific complaints Intends to work on weight loss   ROS Denies recent fever or chills. Denies sinus pressure, nasal congestion, ear pain or sore throat. Denies chest congestion, productive cough or wheezing. Denies chest pains, palpitations and leg swelling Denies abdominal pain, nausea, vomiting,diarrhea or constipation.   Denies dysuria, frequency, hesitancy or incontinence. Denies joint pain, swelling and limitation in mobility. Denies headaches, seizures, numbness, or tingling. Denies depression, anxiety or insomnia. Denies skin break down or rash.   PE  BP 120/88   Pulse 88   Resp 12   Ht 5\' 2"  (1.575 m)   Wt 240 lb (108.9 kg)   SpO2 99% Comment: room air  BMI 43.90 kg/m   Patient alert and oriented and in no cardiopulmonary distress.  HEENT: No facial asymmetry, EOMI,   oropharynx pink and moist.  Neck supple no JVD, no mass.  Chest: Clear to auscultation bilaterally.  CVS: S1, S2 no murmurs, no S3.Regular rate.  ABD: Soft non tender.   Ext: No edema  MS: Adequate ROM spine, shoulders, hips and knees.  Skin: Intact, no ulcerations or rash noted.  Psych: Good eye contact, normal affect. Memory intact not anxious or depressed appearing.  CNS: CN 2-12 intact, power,  normal throughout.no focal deficits noted.   Assessment & Plan  Hyperlipidemia Hyperlipidemia:Low fat diet discussed and encouraged.   Lipid Panel  Lab Results  Component Value  Date   CHOL 183 09/06/2015   HDL 40 (L) 09/06/2015   LDLCALC 108 09/06/2015   TRIG 177 (H) 09/06/2015   CHOLHDL 4.6 09/06/2015   Updated lab needed at/ before next visit.     Allergic rhinitis No current flare, will use OTC medication if needed, currently breast feeding  Morbid obesity (HCC) Improved, with small amount of weight loss since last visit  Patient re-educated about  the importance of commitment to a  minimum of 150 minutes of exercise per week as able.  The importance of healthy food choices with portion control discussed, as well as eating regularly and within a 12 hour window most days. The need to choose "clean , green" food 50 to 75% of the time is discussed, as well as to make water the primary drink and set a goal of 64 ounces water daily.  Encouraged to start a food diary,  and to consider  joining a support group. Sample diet sheets offered. Goals set by the patient for the next several months.   Weight /BMI 05/26/2018 07/31/2017 07/04/2017  WEIGHT 240 lb 246 lb 245 lb  HEIGHT 5\' 2"  5\' 2"  5\' 2"   BMI 43.9 kg/m2 44.99 kg/m2 44.81 kg/m2      Vitamin D deficiency Updated lab needed at/ before next visit.

## 2018-05-29 NOTE — Assessment & Plan Note (Signed)
Improved, with small amount of weight loss since last visit  Patient re-educated about  the importance of commitment to a  minimum of 150 minutes of exercise per week as able.  The importance of healthy food choices with portion control discussed, as well as eating regularly and within a 12 hour window most days. The need to choose "clean , green" food 50 to 75% of the time is discussed, as well as to make water the primary drink and set a goal of 64 ounces water daily.  Encouraged to start a food diary,  and to consider  joining a support group. Sample diet sheets offered. Goals set by the patient for the next several months.   Weight /BMI 05/26/2018 07/31/2017 07/04/2017  WEIGHT 240 lb 246 lb 245 lb  HEIGHT 5\' 2"  5\' 2"  5\' 2"   BMI 43.9 kg/m2 44.99 kg/m2 44.81 kg/m2

## 2018-05-29 NOTE — Assessment & Plan Note (Signed)
Hyperlipidemia:Low fat diet discussed and encouraged.   Lipid Panel  Lab Results  Component Value Date   CHOL 183 09/06/2015   HDL 40 (L) 09/06/2015   LDLCALC 108 09/06/2015   TRIG 177 (H) 09/06/2015   CHOLHDL 4.6 09/06/2015   Updated lab needed at/ before next visit.    

## 2018-06-23 ENCOUNTER — Telehealth: Payer: 59 | Admitting: Physician Assistant

## 2018-06-23 DIAGNOSIS — J029 Acute pharyngitis, unspecified: Secondary | ICD-10-CM | POA: Diagnosis not present

## 2018-06-23 NOTE — Progress Notes (Signed)
Based on what you shared with me, I feel your condition warrants further evaluation and I recommend that you be seen for a face to face office visit.   NOTE: If you entered your credit card information for this eVisit, you will not be charged. You may see a "hold" on your card for the $30 but that hold will drop off and you will not have a charge processed.  If you are having a true medical emergency please call 911.  If you need an urgent face to face visit, [facility] has four urgent care centers for your convenience.  If you need care fast and have a high deductible or no insurance consider:   [email or web address] to reserve your spot online an avoid wait times  [facility] [address], [address] [place], [place] [address] 8 am to 8 pm Monday-Friday 10 am to 4 pm Saturday-Sunday *Across the street from [employer]  [facility]  [address] [place], [address] 8 am to 5 pm Monday-Friday * In the [facility]   The following sites will take your insurance:  . [facility]  [phone] Get Driving Directions Find a Provider at this Location  [address] [place], [place] [address] . 10 am to 8 pm Monday-Friday . 12 pm to 8 pm Saturday-Sunday   . [facility]  [phone] Get Driving Directions Find a Provider at this Location  [address], [address] [place], [place] [address] . 8 am to 8 pm Monday-Friday . 9 am to 6 pm Saturday . 11 am to 6 pm Sunday   . [facility] [place]  [phone] Get Driving Directions  [address] [place], [place] [address] . 8 am to 8 pm Monday-Friday . 8 am to 4 pm Saturday-Sunday   Your e-visit answers were reviewed by a board certified advanced clinical practitioner to complete your personal care plan.  Thank you for using e-Visits.  

## 2018-06-30 IMAGING — DX DG KNEE COMPLETE 4+V*L*
4 series · 4 of 4 positions shown · non-contrast
Comparison: None.

CLINICAL DATA: Bilateral knee pain.  No known injury.

EXAM:
LEFT KNEE - COMPLETE 4+ VIEW

[tunnel]
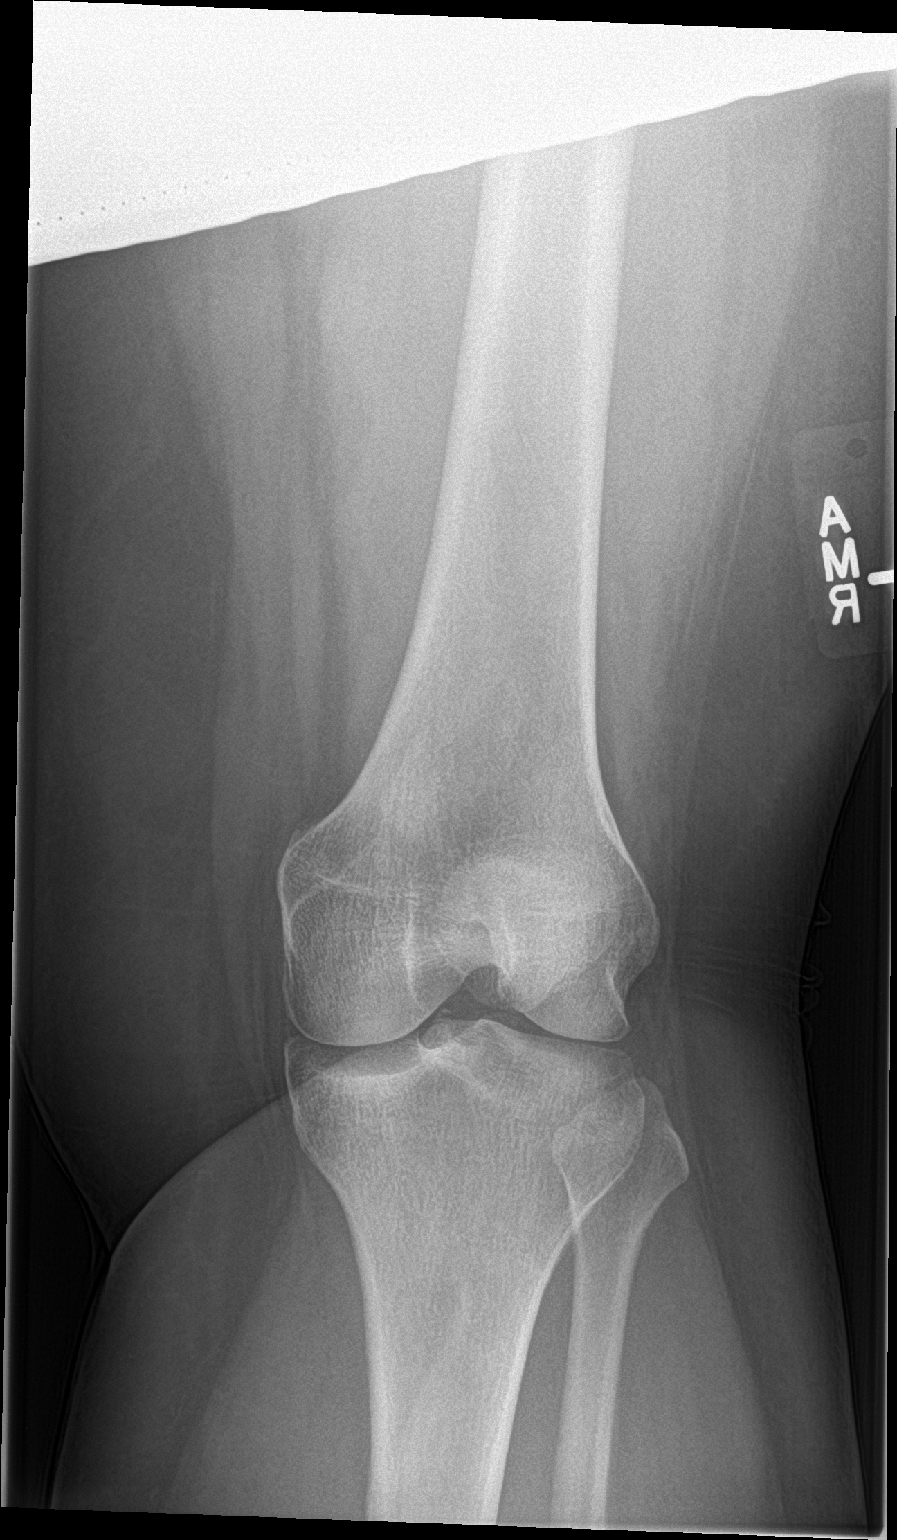

[knee lat]
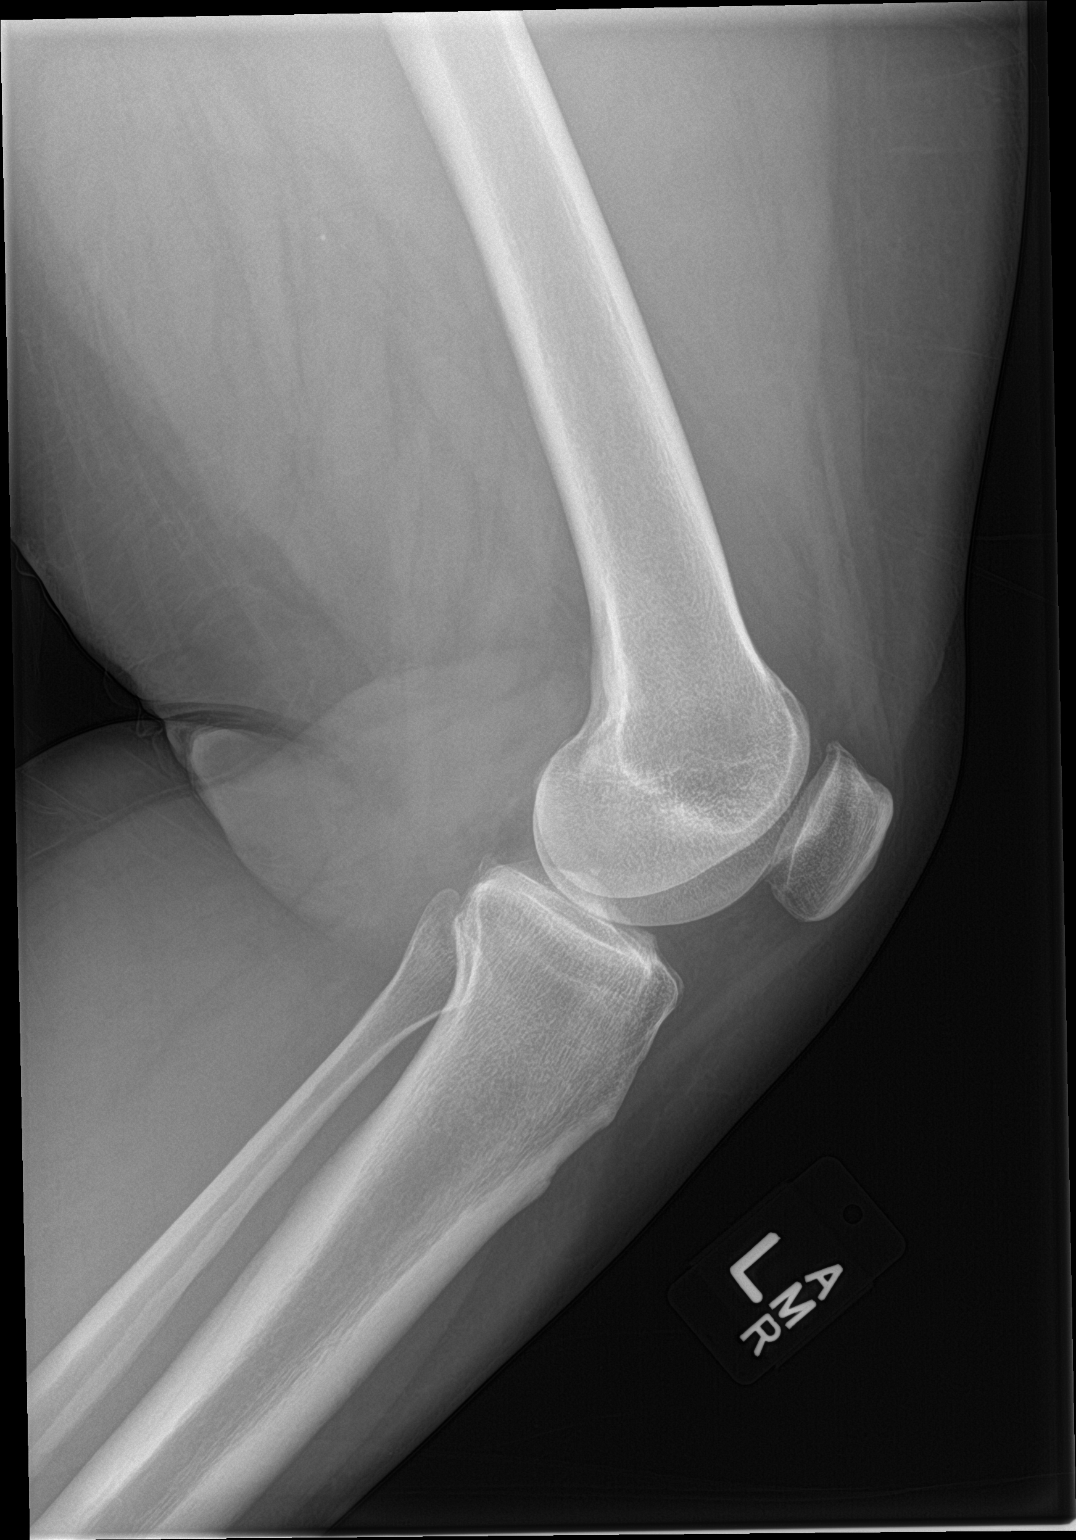

[knee sunrise]
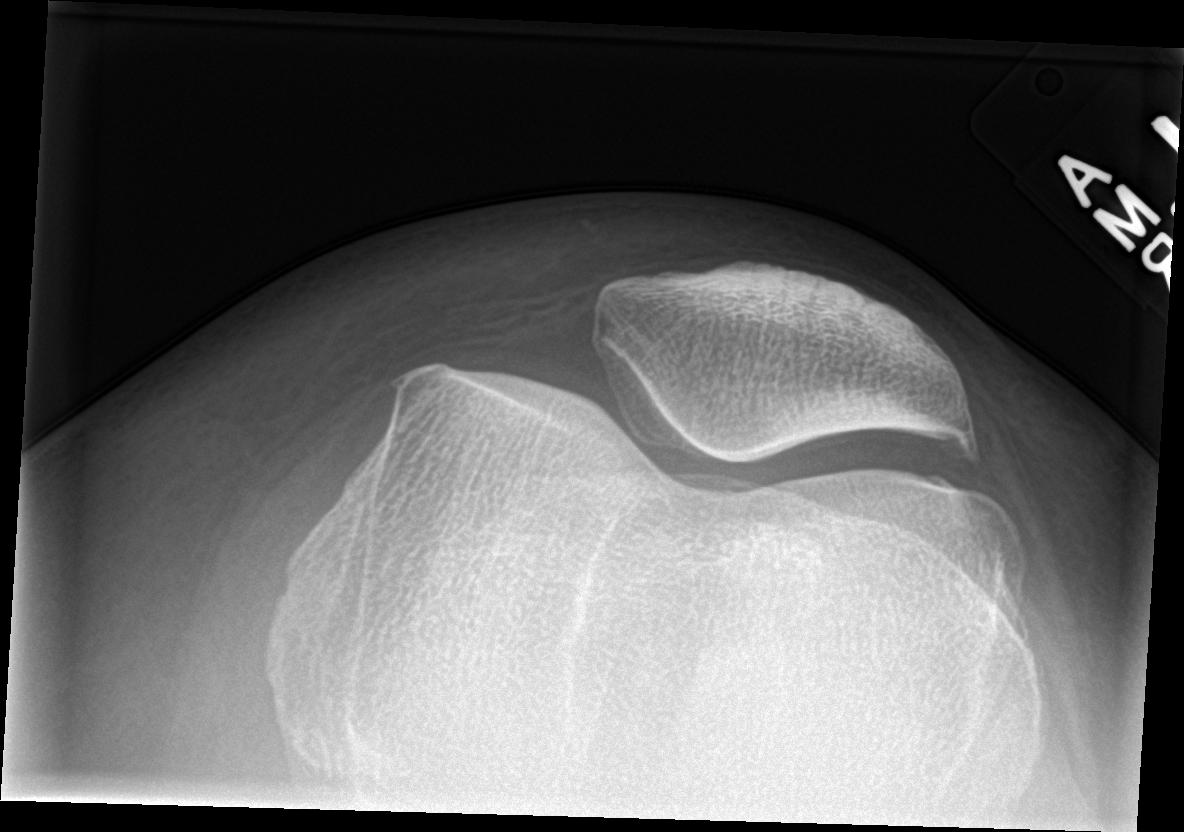

[knee ap]
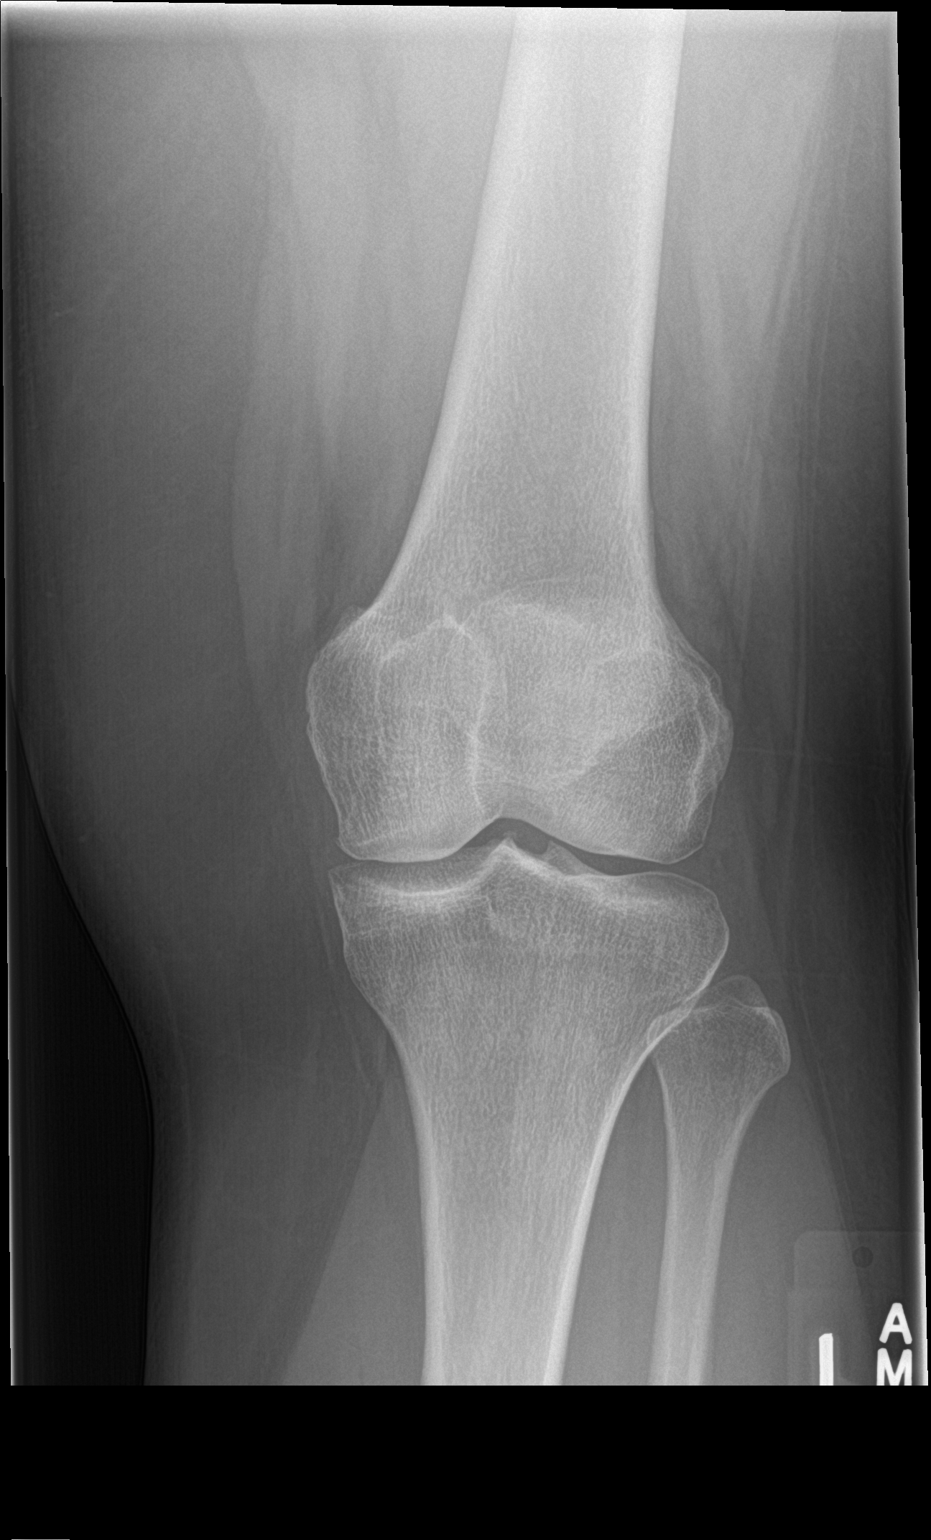

[4 of 4 positions shown; findings below may reference images not displayed]

FINDINGS: No evidence of fracture, dislocation, or joint effusion. Small bony
fragment adjacent to the medial tibial eminence. Small marginal
osteophyte along the periphery of the lateral patellofemoral
compartment. Mild osteoarthritis of the medial femorotibial
compartment. Soft tissues are unremarkable.
IMPRESSION: 1. Small bony fragment adjacent to the medial tibial eminence. This
may reflect sequela prior avulsive injury.
2. Otherwise no acute osseous injury of the left knee.

## 2018-06-30 IMAGING — DX DG KNEE COMPLETE 4+V*R*
4 series · 4 of 4 positions shown · non-contrast
Comparison: No recent.

CLINICAL DATA: Pain.

EXAM:
RIGHT KNEE - COMPLETE 4+ VIEW

[knee ap]
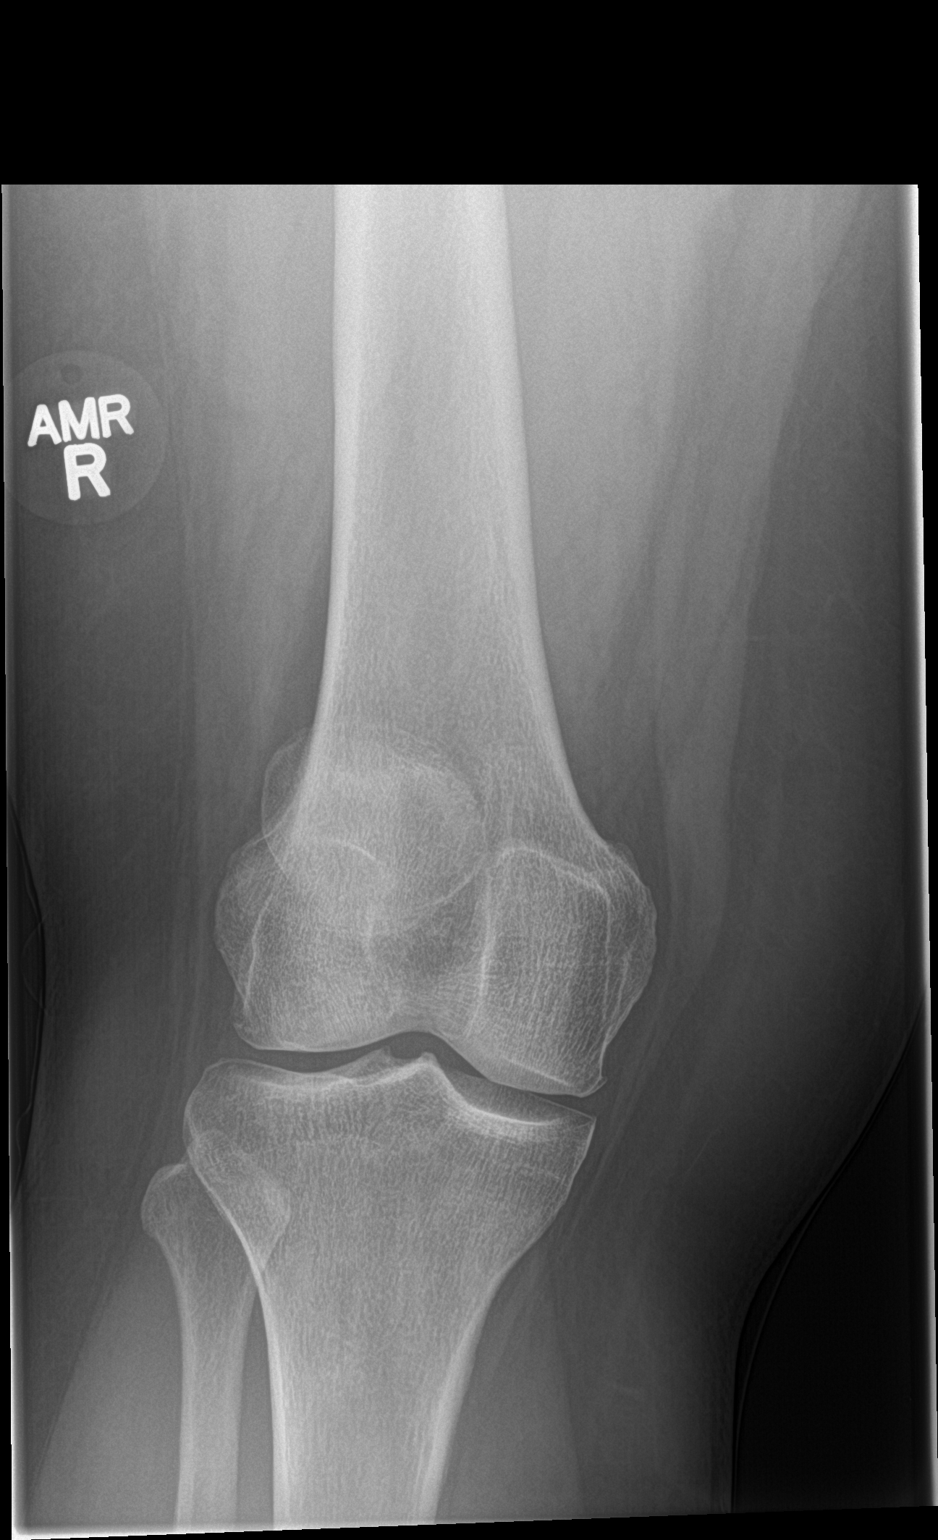

[tunnel]
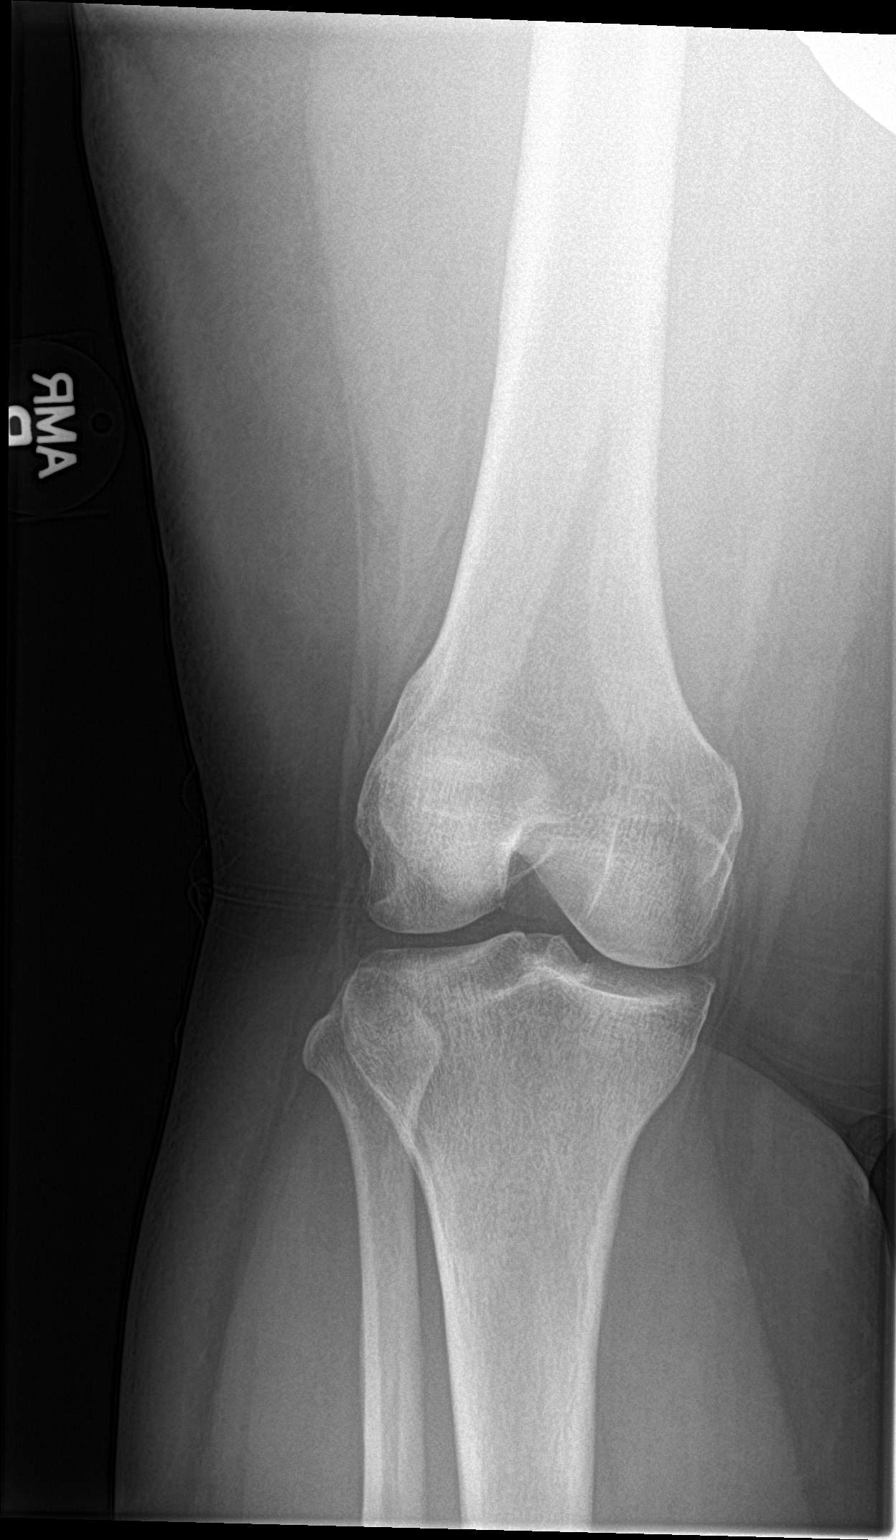

[knee lat]
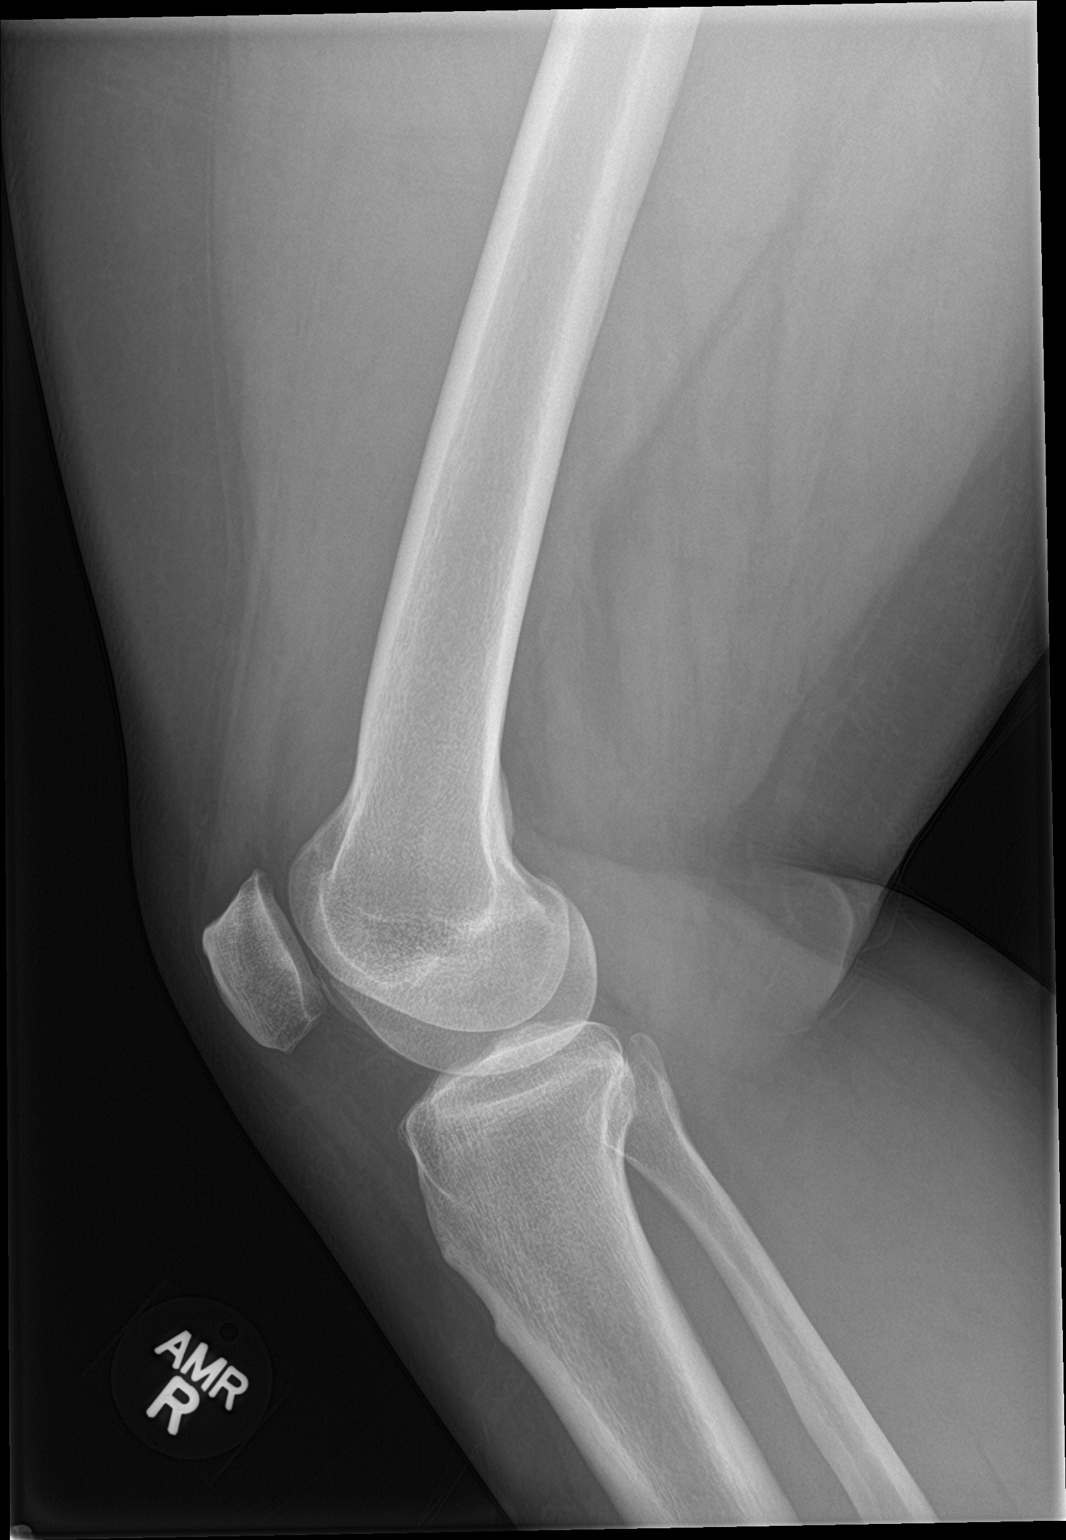

[knee sunrise]
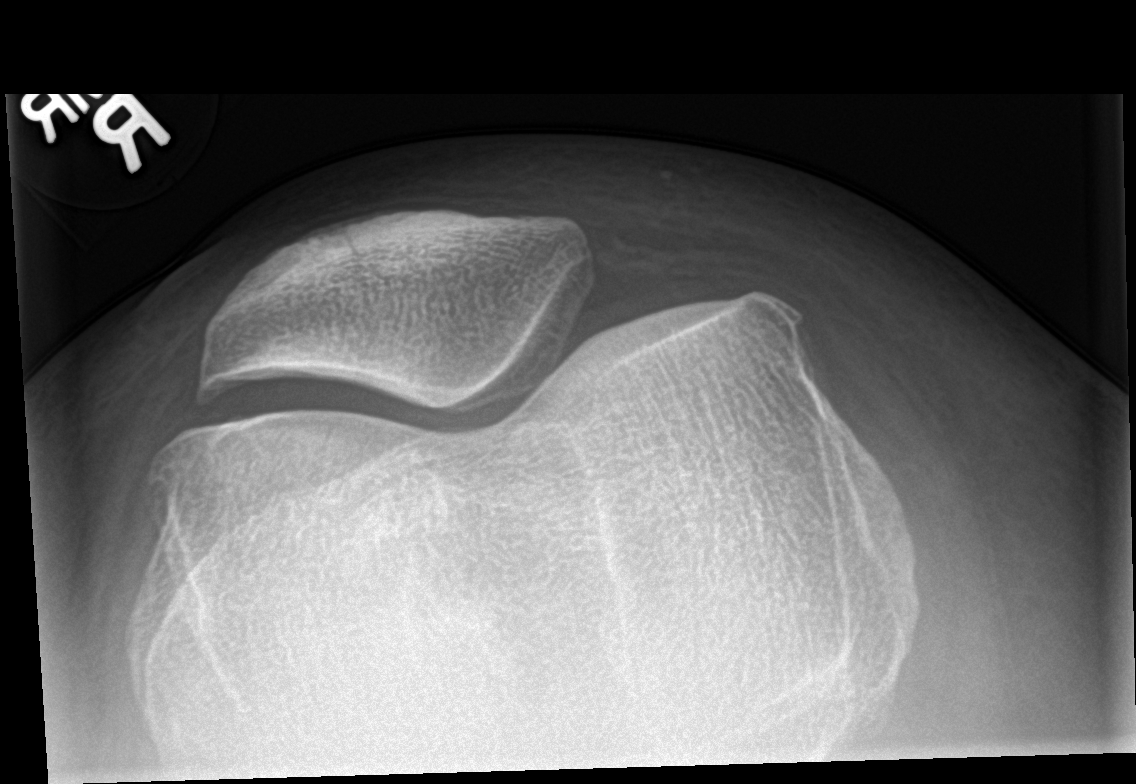

[4 of 4 positions shown; findings below may reference images not displayed]

FINDINGS: No acute bony or joint abnormality identified. No evidence fracture
dislocation.
IMPRESSION: No acute or focal abnormality identified.

## 2018-11-24 ENCOUNTER — Ambulatory Visit: Payer: 59 | Admitting: Family Medicine

## 2019-01-21 ENCOUNTER — Ambulatory Visit (INDEPENDENT_AMBULATORY_CARE_PROVIDER_SITE_OTHER): Payer: 59 | Admitting: Family Medicine

## 2019-01-21 ENCOUNTER — Encounter: Payer: Self-pay | Admitting: Family Medicine

## 2019-01-21 ENCOUNTER — Other Ambulatory Visit: Payer: Self-pay

## 2019-01-21 VITALS — BP 120/88 | Ht 62.0 in | Wt 240.0 lb

## 2019-01-21 DIAGNOSIS — J309 Allergic rhinitis, unspecified: Secondary | ICD-10-CM

## 2019-01-21 DIAGNOSIS — K219 Gastro-esophageal reflux disease without esophagitis: Secondary | ICD-10-CM | POA: Diagnosis not present

## 2019-01-21 DIAGNOSIS — R002 Palpitations: Secondary | ICD-10-CM | POA: Diagnosis not present

## 2019-01-21 DIAGNOSIS — G4709 Other insomnia: Secondary | ICD-10-CM | POA: Diagnosis not present

## 2019-01-21 DIAGNOSIS — R7301 Impaired fasting glucose: Secondary | ICD-10-CM | POA: Diagnosis not present

## 2019-01-21 DIAGNOSIS — G43009 Migraine without aura, not intractable, without status migrainosus: Secondary | ICD-10-CM | POA: Diagnosis not present

## 2019-01-21 DIAGNOSIS — E559 Vitamin D deficiency, unspecified: Secondary | ICD-10-CM | POA: Diagnosis not present

## 2019-01-21 DIAGNOSIS — E785 Hyperlipidemia, unspecified: Secondary | ICD-10-CM | POA: Diagnosis not present

## 2019-01-21 MED ORDER — FLUTICASONE PROPIONATE 50 MCG/ACT NA SUSP: 2.0000 | Freq: Every day | NASAL | 6 refills | Status: DC

## 2019-01-21 MED FILL — FLUTICASONE PROP 50 MCG SPR: 50 | 30 days supply | Qty: 16 | Fill #0

## 2019-01-21 NOTE — Assessment & Plan Note (Signed)
Increased frequency with work stress and occurs only on the job, which is 2 days/ week

## 2019-01-21 NOTE — Progress Notes (Signed)
Virtual Visit via Telephone Note  I connected with [name] on 01/21/19 at  8:00 AM EDT by telephone and verified that I am speaking with the correct person using two identifiers.  Location: Patient: home Provider: office   I discussed the limitations, risks, security and privacy concerns of performing an evaluation and management service by telephone and the availability of in person appointments. I also discussed with the patient that there may be a patient responsible charge related to this service. The patient expressed understanding and agreed to proceed.   History of Present Illness: F/u chronic health Palpitations related to stress , generally at work , intermittently x 2 months Increased and uncontrolled allergy symptoms with season change as expected, will add flonase Denies recent fever or chills. Denies  ear pain or sore throat. Denies chest congestion, productive cough or wheezing. Denies chest pains,  and leg swelling Denies abdominal pain, nausea, vomiting,diarrhea or constipation.   Denies dysuria, frequency, hesitancy or incontinence. Denies joint pain, swelling and limitation in mobility. Denies headaches, seizures, numbness, or tingling. Denies depression, or insomnia. Denies skin break down or rash. Reports irregular exercise/ sporadic. Reports good food choice       Observations/Objective:  BP 120/88   Ht 5\' 2"  (1.575 m)   Wt 240 lb (108.9 kg)   BMI 43.90 kg/m  Good communication with no confusion and intact memory. Alert and oriented x 3 No signs of respiratory distress during speech   Assessment and Plan:  Allergic rhinitis Uncontrolled  As expected in Fall, add flonase  GERD (gastroesophageal reflux disease) Asymptomatic off medication, breastfeeding 42 month old daughter  Migraine headache Denies excessive / uncontrolled headaches  Morbid obesity ([place])  Patient re-educated about  the importance of commitment to a  minimum of 150  minutes of exercise per week as able.  The importance of healthy food choices with portion control discussed, as well as eating regularly and within a 12 hour window most days. The need to choose "clean , green" food 50 to 75% of the time is discussed, as well as to make water the primary drink and set a goal of 64 ounces water daily.    Weight /BMI 01/21/2019 05/26/2018 07/31/2017  WEIGHT 240 lb 240 lb 246 lb  HEIGHT 5\' 2"  5\' 2"  5\' 2"   BMI 43.9 kg/m2 43.9 kg/m2 44.99 kg/m2      Palpitations Increased frequency with work stress and occurs only on the job, which is 2 days/ week  Hyperlipidemia Hyperlipidemia:Low fat diet discussed and encouraged.   Lipid Panel  Lab Results  Component Value Date   CHOL 183 09/06/2015   HDL 40 (L) 09/06/2015   LDLCALC 108 09/06/2015   TRIG 177 (H) 09/06/2015   CHOLHDL 4.6 09/06/2015   Updated lab needed at/ before next visit.     Insomnia Sleeps 5hr on average and finds this sufficient   Follow Up Instructions:    I discussed the assessment and treatment plan with the patient. The patient was provided an opportunity to ask questions and all were answered. The patient agreed with the plan and demonstrated an understanding of the instructions.   The patient was advised to call back or seek an in-person evaluation if the symptoms worsen or if the condition fails to improve as anticipated.  I provided 21  minutes of non-face-to-face time during this encounter.   Tula Nakayama, MD

## 2019-01-21 NOTE — Assessment & Plan Note (Signed)
Uncontrolled  As expected in Fall, add flonase

## 2019-01-21 NOTE — Assessment & Plan Note (Signed)
Hyperlipidemia:Low fat diet discussed and encouraged.   Lipid Panel  Lab Results  Component Value Date   CHOL 183 09/06/2015   HDL 40 (L) 09/06/2015   LDLCALC 108 09/06/2015   TRIG 177 (H) 09/06/2015   CHOLHDL 4.6 09/06/2015   Updated lab needed at/ before next visit.

## 2019-01-21 NOTE — Assessment & Plan Note (Signed)
Denies excessive / uncontrolled headaches

## 2019-01-21 NOTE — Assessment & Plan Note (Signed)
Asymptomatic off medication, breastfeeding 60 month old daughter

## 2019-01-21 NOTE — Assessment & Plan Note (Signed)
  Patient re-educated about  the importance of commitment to a  minimum of 150 minutes of exercise per week as able.  The importance of healthy food choices with portion control discussed, as well as eating regularly and within a 12 hour window most days. The need to choose "clean , green" food 50 to 75% of the time is discussed, as well as to make water the primary drink and set a goal of 64 ounces water daily.    Weight /BMI 01/21/2019 05/26/2018 07/31/2017  WEIGHT 240 lb 240 lb 246 lb  HEIGHT 5\' 2"  5\' 2"  5\' 2"   BMI 43.9 kg/m2 43.9 kg/m2 44.99 kg/m2

## 2019-01-21 NOTE — Assessment & Plan Note (Signed)
Sleeps 5hr on average and finds this sufficient

## 2019-01-21 NOTE — Patient Instructions (Signed)
F/U in 6 months, call if you need me sooner  Flonase sent in for use with allergies  Please get fasting labs CBC, fasting lipids, cmp AND egfR, tsh, Hba1c AND VIT d AS SOON AS POSSIBLE  Think about what you will eat, plan ahead. Choose " clean, green, fresh or frozen" over canned, processed or packaged foods which are more sugary, salty and fatty. 70 to 75% of food eaten should be vegetables and fruit. Three meals at set times with snacks allowed between meals, but they must be fruit or vegetables. Aim to eat over a 12 hour period , example 7 am to 7 pm, and STOP after  your last meal of the day. Drink water,generally about 64 ounces per day, no other drink is as healthy. Fruit juice is best enjoyed in a healthy way, by EATING the fruit.  It is important that you exercise regularly at least 30 minutes 5 times a week. If you develop chest pain, have severe difficulty breathing, or feel very tired, stop exercising immediately and seek medical attention  Thanks for choosing [facility], we consider it a privelige to serve you.

## 2019-01-21 NOTE — Addendum Note (Signed)
Addended by: Eual Fines on: 01/21/2019 08:55 AM   Modules accepted: Orders

## 2019-03-08 DIAGNOSIS — E559 Vitamin D deficiency, unspecified: Secondary | ICD-10-CM | POA: Diagnosis not present

## 2019-03-08 DIAGNOSIS — E785 Hyperlipidemia, unspecified: Secondary | ICD-10-CM | POA: Diagnosis not present

## 2019-03-08 DIAGNOSIS — R7301 Impaired fasting glucose: Secondary | ICD-10-CM | POA: Diagnosis not present

## 2019-03-09 ENCOUNTER — Encounter: Payer: Self-pay | Admitting: Family Medicine

## 2019-03-09 ENCOUNTER — Other Ambulatory Visit: Payer: Self-pay | Admitting: Family Medicine

## 2019-03-09 MED ORDER — ERGOCALCIFEROL 1.25 MG (50000 UT) PO CAPS: 50000.0000 [IU] | ORAL_CAPSULE | ORAL | 1 refills | Status: DC

## 2019-03-09 NOTE — Progress Notes (Signed)
Lab added

## 2019-03-09 NOTE — Progress Notes (Signed)
Vit d 

## 2019-03-10 ENCOUNTER — Encounter: Payer: Self-pay | Admitting: Family Medicine

## 2019-03-10 LAB — COMPLETE METABOLIC PANEL WITH GFR
AG Ratio: 1.5 (calc) (ref 1.0–2.5)
ALT: 23 U/L (ref 6–29)
AST: 14 U/L (ref 10–30)
Albumin: 3.8 g/dL (ref 3.6–5.1)
Alkaline phosphatase (APISO): 97 U/L (ref 31–125)
BUN: 14 mg/dL (ref 7–25)
CO2: 26 mmol/L (ref 20–32)
Calcium: 9.4 mg/dL (ref 8.6–10.2)
Chloride: 105 mmol/L (ref 98–110)
Creat: 0.61 mg/dL (ref 0.50–1.10)
GFR, Est African American: 135 mL/min/{1.73_m2} (ref >=60)
GFR, Est Non African American: 117 mL/min/{1.73_m2} (ref >=60)
Globulin: 2.6 g/dL (calc) (ref 1.9–3.7)
Glucose, Bld: 112 mg/dL — ABNORMAL HIGH (ref 65–99)
Potassium: 4.3 mmol/L (ref 3.5–5.3)
Sodium: 139 mmol/L (ref 135–146)
Total Bilirubin: 0.2 mg/dL (ref 0.2–1.2)
Total Protein: 6.4 g/dL (ref 6.1–8.1)

## 2019-03-10 LAB — CBC
HCT: 40.5 % (ref 35.0–45.0)
Hemoglobin: 13 g/dL (ref 11.7–15.5)
MCH: 26.9 pg — ABNORMAL LOW (ref 27.0–33.0)
MCHC: 32.1 g/dL (ref 32.0–36.0)
MCV: 83.7 fL (ref 80.0–100.0)
MPV: 11.1 fL (ref 7.5–12.5)
Platelets: 386 10*3/uL (ref 140–400)
RBC: 4.84 10*6/uL (ref 3.80–5.10)
RDW: 14 % (ref 11.0–15.0)
WBC: 10.6 10*3/uL (ref 3.8–10.8)

## 2019-03-10 LAB — LIPID PANEL
Cholesterol: 174 mg/dL (ref <200)
HDL: 42 mg/dL — ABNORMAL LOW (ref >=50)
LDL Cholesterol (Calc): 109 mg/dL (calc) — ABNORMAL HIGH
Non-HDL Cholesterol (Calc): 132 mg/dL (calc) — ABNORMAL HIGH (ref <130)
Total CHOL/HDL Ratio: 4.1 (calc) (ref <5.0)
Triglycerides: 124 mg/dL (ref <150)

## 2019-03-10 LAB — TEST AUTHORIZATION

## 2019-03-10 LAB — T3, FREE: T3, Free: 3.2 pg/mL (ref 2.3–4.2)

## 2019-03-10 LAB — HEMOGLOBIN A1C
Hgb A1c MFr Bld: 6 % of total Hgb — ABNORMAL HIGH (ref <5.7)
Mean Plasma Glucose: 126 (calc)
eAG (mmol/L): 7 (calc)

## 2019-03-10 LAB — T4, FREE: Free T4: 1.1 ng/dL (ref 0.8–1.8)

## 2019-03-10 LAB — VITAMIN D 25 HYDROXY (VIT D DEFICIENCY, FRACTURES): Vit D, 25-Hydroxy: 17 ng/mL — ABNORMAL LOW (ref 30–100)

## 2019-03-10 LAB — TSH: TSH: 5.03 mIU/L — ABNORMAL HIGH

## 2019-04-01 ENCOUNTER — Other Ambulatory Visit: Payer: 59 | Admitting: Adult Health

## 2019-06-08 ENCOUNTER — Other Ambulatory Visit (HOSPITAL_COMMUNITY)
Admission: RE | Admit: 2019-06-08 | Discharge: 2019-06-08 | Disposition: A | Discharge status: Home / Self Care | Payer: 59 | Source: Ambulatory Visit | Attending: Adult Health | Admitting: Adult Health

## 2019-06-08 ENCOUNTER — Encounter: Payer: Self-pay | Admitting: Adult Health

## 2019-06-08 ENCOUNTER — Ambulatory Visit (INDEPENDENT_AMBULATORY_CARE_PROVIDER_SITE_OTHER): Payer: 59 | Admitting: Adult Health

## 2019-06-08 ENCOUNTER — Other Ambulatory Visit: Payer: Self-pay

## 2019-06-08 VITALS — BP 135/95 | HR 103 | Ht 62.0 in | Wt 249.0 lb

## 2019-06-08 DIAGNOSIS — Z01419 Encounter for gynecological examination (general) (routine) without abnormal findings: Secondary | ICD-10-CM | POA: Diagnosis not present

## 2019-06-08 DIAGNOSIS — Z975 Presence of (intrauterine) contraceptive device: Secondary | ICD-10-CM

## 2019-06-08 HISTORY — DX: Presence of (intrauterine) contraceptive device: Z97.5

## 2019-06-08 NOTE — Progress Notes (Signed)
Patient ID: [name], female   DOB: Jan 30, 1981, 39 y.o.   MRN: [id number] History of Present Illness: [name] is a 39 year old white female, married, [name], in for a well woman gyn exam and pap. She is Charity fundraiser in NICU.  She has liletta, it was inserted 2019. She is still breastfeeding her 37 year old some.  PCP is Dr Lodema Hong.    Current Medications, Allergies, Past Medical History, Past Surgical History, Family History and Social History were reviewed in [employer] record.     Review of Systems: Patient denies any regular headaches, hearing loss, fatigue, blurred vision, shortness of breath, chest pain, abdominal pain, problems with bowel movements, urination, or intercourse. No joint pain or mood swings. Had headache today, took Fioricet and better     Physical Exam:BP (!) 135/95 (BP Location: Right Arm, Patient Position: Sitting, Cuff Size: Large)   Pulse (!) 103   Ht 5\' 2"  (1.575 m)   Wt 249 lb (112.9 kg)   Breastfeeding Yes   BMI 45.54 kg/m BP good at home. General:  Well developed, well nourished, no acute distress Skin:  Warm and dry Neck:  Midline trachea, normal thyroid, good ROM, no lymphadenopathy Lungs; Clear to auscultation bilaterally Breast:  No dominant palpable mass, retraction, or nipple discharge Cardiovascular: Regular rate and rhythm Abdomen:  Soft, non tender, no hepatosplenomegaly Pelvic:  External genitalia is normal in appearance, no lesions.  The vagina is normal in appearance. Urethra has no lesions or masses. The cervix is bulbous.+IUD string, pap with high risk HPV 16/18 genotyping performed.  Uterus is felt to be normal size, shape, and contour.  No adnexal masses or tenderness noted.Bladder is non tender, no masses felt. Extremities/musculoskeletal:  No swelling or varicosities noted, no clubbing or cyanosis Psych:  No mood changes, alert and cooperative,seems happy Fall risk is low PHQ 2 score is 0. Co exam with NP student.  Impression and Plan: 1. Encounter for gynecological examination with Papanicolaou smear of cervix Pap sent Physical in 1 year Pap in 3 if normal Labs with PCP  2. IUD (intrauterine device) in place

## 2019-06-09 LAB — CYTOLOGY - PAP
Adequacy: ABSENT
Comment: NEGATIVE
Diagnosis: NEGATIVE
High risk HPV: NEGATIVE

## 2019-07-22 ENCOUNTER — Ambulatory Visit: Payer: 59 | Admitting: Family Medicine

## 2019-08-10 ENCOUNTER — Telehealth (INDEPENDENT_AMBULATORY_CARE_PROVIDER_SITE_OTHER): Payer: 59 | Admitting: Family Medicine

## 2019-08-10 ENCOUNTER — Encounter: Payer: Self-pay | Admitting: Family Medicine

## 2019-08-10 ENCOUNTER — Other Ambulatory Visit: Payer: Self-pay

## 2019-08-10 DIAGNOSIS — E785 Hyperlipidemia, unspecified: Secondary | ICD-10-CM

## 2019-08-10 DIAGNOSIS — R7989 Other specified abnormal findings of blood chemistry: Secondary | ICD-10-CM

## 2019-08-10 DIAGNOSIS — J309 Allergic rhinitis, unspecified: Secondary | ICD-10-CM | POA: Diagnosis not present

## 2019-08-10 DIAGNOSIS — R7301 Impaired fasting glucose: Secondary | ICD-10-CM

## 2019-08-10 DIAGNOSIS — R002 Palpitations: Secondary | ICD-10-CM

## 2019-08-10 DIAGNOSIS — E559 Vitamin D deficiency, unspecified: Secondary | ICD-10-CM | POA: Diagnosis not present

## 2019-08-10 MED ORDER — METOPROLOL TARTRATE 25 MG PO TABS: 25.0000 mg | ORAL_TABLET | Freq: Every day | ORAL | 1 refills | Status: DC | PRN

## 2019-08-10 MED FILL — METOPROLOL TARTRATE 25 MG T: 25 | 30 days supply | Qty: 30 | Fill #0

## 2019-08-10 NOTE — Patient Instructions (Addendum)
I appreciate the opportunity to provide you with care for your health and wellness. Today we discussed: Thyroid and overall health  Follow up: Sept-Oct annual in office   Labs placed today-fasting in the next week   No referrals today  Hopefully labs will be stable, if not we will develop a plan you are comfortable with.  Please continue to practice social distancing to keep you, your family, and our community safe.  If you must go out, please wear a mask and practice good handwashing.  It was a pleasure to see you and I look forward to continuing to work together on your health and well-being. Please do not hesitate to call the office if you need care or have questions about your care.  Have a wonderful day and week. With Gratitude, Tereasa Coop, DNP, AGNP-BC

## 2019-08-10 NOTE — Progress Notes (Signed)
Virtual Visit via Video Note   This visit type was conducted due to national recommendations for restrictions regarding the COVID-19 Pandemic (e.g. social distancing) in an effort to limit this patient's exposure and mitigate transmission in our community.  Due to her co-morbid illnesses, this patient is at least at moderate risk for complications without adequate follow up.  This format is felt to be most appropriate for this patient at this time.  All issues noted in this document were discussed and addressed.  A limited physical exam was performed with this format.   Evaluation Performed:  Follow-up visit  Date:  08/10/2019   ID:  [name], [name] [birth year], MRN [id number]  Patient Location: Home Provider Location: Office  Location of Patient: Home Location of Provider: Telehealth Consent was obtain for visit to be over via telehealth. I verified that I am speaking with the correct person using two identifiers.  PCP:  Kerri Perches, MD   Chief Complaint:  Overall health thyroid labs  History of Present Illness:    [name] is a 39 y.o. female with history of having hyperlipidemia, palpitations, elevated TSH. She presents today reporting that she is fairly tired all the time, having a brain fog that she cannot get out of she cannot sleep and has a daily headache.  She denies having any constipation issues or hair thinning.  But reports that she has chronic dry skin.  Additionally she reports that she has cold intolerance though she reports that she has had cold intolerance for most of her life.  Thyroid level back in November was elevated however T3 and T4 were normal.  She has not had any follow-up labs since then.  And wants to see if she can get those to see if anything is going on.   The patient does not have symptoms concerning for COVID-19 infection (fever, chills, cough, or new shortness of breath).   Past Medical, Surgical, Social History, Allergies,  and Medications have been Reviewed.  Past Medical History:  Diagnosis Date  . Allergy   . Arthritis    BACK  . Back pain 09/05/2010  . Headache(784.0)    MIGRAINES  -- LAST ONE  09/2011  . History of shoulder dystocia in prior pregnancy 10/11/2016   Mild, 9lb2oz, [place]    . Hx of preeclampsia 10/11/2016   X3, w/ 36wk IOL d/t severe pre-e 2019  . Hyperlipidemia 07/23/2012  . IUD (intrauterine device) in place 06/08/2019   07/04/17   . Knee pain, bilateral 01/31/2016  . Obesity (BMI 30-39.9) 10/09/2009  . Palpitations   . Pregnancy induced hypertension   . Pregnant 05/16/2015  . Vaginal spotting 05/16/2015   Past Surgical History:  Procedure Laterality Date  . CHOLECYSTECTOMY N/A 08/24/2012   Procedure: LAPAROSCOPIC CHOLECYSTECTOMY;  Surgeon: Dalia Heading, MD;  Location: [facility];  Service: General;  Laterality: N/A;  . DILATION AND CURETTAGE OF UTERUS    . LUMBAR LAMINECTOMY/DECOMPRESSION MICRODISCECTOMY  12/03/2011   Procedure: LUMBAR LAMINECTOMY/DECOMPRESSION MICRODISCECTOMY 1 LEVEL;  Surgeon: Maeola Harman, MD;  Location: [facility];  Service: Neurosurgery;  Laterality: Left;  LEFT Lumbar four-five microdiskectomy  . TONSILLECTOMY    . WISDOM TOOTH EXTRACTION       Current Meds  Medication Sig  . fluticasone (FLONASE) 50 MCG/ACT nasal spray Place 2 sprays into both nostrils daily. (Patient taking differently: Place 2 sprays into both nostrils as needed. )  . levonorgestrel (LILETTA, 52 MG,) 19.5 MCG/DAY IUD IUD  1 each by Intrauterine route once.  . loratadine (CLARITIN) 10 MG tablet Take 10 mg by mouth daily.       Allergies:   Shellfish allergy, Cetirizine hcl, and Chocolate   ROS:   Please see the history of present illness.    All other systems reviewed and are negative.   Labs/Other Tests and Data Reviewed:    Recent Labs: 03/08/2019: ALT 23; BUN 14; Creat 0.61; Hemoglobin 13.0; Platelets 386; Potassium 4.3; Sodium 139; TSH 5.03   Recent Lipid Panel Lab Results   Component Value Date/Time   CHOL 174 03/08/2019 09:13 AM   TRIG 124 03/08/2019 09:13 AM   HDL 42 (L) 03/08/2019 09:13 AM   CHOLHDL 4.1 03/08/2019 09:13 AM   LDLCALC 109 (H) 03/08/2019 09:13 AM    Wt Readings from Last 3 Encounters:  08/10/19 245 lb (111.1 kg)  06/08/19 249 lb (112.9 kg)  01/21/19 240 lb (108.9 kg)     Objective:    Vital Signs:  BP 117/76   Ht 5\' 2"  (1.575 m)   Wt 245 lb (111.1 kg)   BMI 44.81 kg/m    VITAL SIGNS:  reviewed GEN:  alert and oriented  EYES:  sclerae anicteric, EOMI - Extraocular Movements Intact RESPIRATORY:  no shortness of breath in conversation SKIN:  no rash, lesions or ulcers. MUSCULOSKELETAL:  no obvious deformities. NEURO:  alert and oriented x 3, no obvious focal deficit PSYCH:  normal affect and mood   ASSESSMENT & PLAN:    1. Morbid obesity ([place])  - CBC - COMPLETE METABOLIC PANEL WITH GFR - Lipid panel  2. Palpitations  - TSH - metoprolol tartrate (LOPRESSOR) 25 MG tablet; Take 1 tablet (25 mg total) by mouth daily as needed.  Dispense: 30 tablet; Refill: 1  3. Hyperlipidemia, unspecified hyperlipidemia type  - Lipid panel  4. Allergic rhinitis, unspecified seasonality, unspecified trigger   5. Vitamin D deficiency  - VITAMIN D 25 Hydroxy (Vit-D Deficiency, Fractures)  6. Impaired fasting glucose  - Hemoglobin A1c  7. Elevated TSH  - TSH   Time:   Today, I have spent 20 minutes with the patient with telehealth technology discussing the above problems.     Medication Adjustments/Labs and Tests Ordered: Current medicines are reviewed at length with the patient today.  Concerns regarding medicines are outlined above.   Tests Ordered: No orders of the defined types were placed in this encounter.   Medication Changes: No orders of the defined types were placed in this encounter.   Disposition:  Follow up Sept-Oct annual Signed, Perlie Mayo, NP  08/10/2019 4:10 PM     [facility]

## 2019-08-19 DIAGNOSIS — R7989 Other specified abnormal findings of blood chemistry: Secondary | ICD-10-CM | POA: Diagnosis not present

## 2019-08-19 DIAGNOSIS — E785 Hyperlipidemia, unspecified: Secondary | ICD-10-CM | POA: Diagnosis not present

## 2019-08-19 DIAGNOSIS — R002 Palpitations: Secondary | ICD-10-CM | POA: Diagnosis not present

## 2019-08-19 DIAGNOSIS — E559 Vitamin D deficiency, unspecified: Secondary | ICD-10-CM | POA: Diagnosis not present

## 2019-08-19 DIAGNOSIS — R7301 Impaired fasting glucose: Secondary | ICD-10-CM | POA: Diagnosis not present

## 2019-08-20 LAB — VITAMIN D 25 HYDROXY (VIT D DEFICIENCY, FRACTURES): Vit D, 25-Hydroxy: 29 ng/mL — ABNORMAL LOW (ref 30–100)

## 2019-08-20 LAB — CBC
HCT: 42.9 % (ref 35.0–45.0)
Hemoglobin: 14.1 g/dL (ref 11.7–15.5)
MCH: 27.8 pg (ref 27.0–33.0)
MCHC: 32.9 g/dL (ref 32.0–36.0)
MCV: 84.4 fL (ref 80.0–100.0)
MPV: 10.2 fL (ref 7.5–12.5)
Platelets: 364 10*3/uL (ref 140–400)
RBC: 5.08 10*6/uL (ref 3.80–5.10)
RDW: 13.7 % (ref 11.0–15.0)
WBC: 8.6 10*3/uL (ref 3.8–10.8)

## 2019-08-20 LAB — COMPLETE METABOLIC PANEL WITH GFR
AG Ratio: 1.5 (calc) (ref 1.0–2.5)
ALT: 31 U/L — ABNORMAL HIGH (ref 6–29)
AST: 22 U/L (ref 10–30)
Albumin: 4 g/dL (ref 3.6–5.1)
Alkaline phosphatase (APISO): 95 U/L (ref 31–125)
BUN: 15 mg/dL (ref 7–25)
CO2: 26 mmol/L (ref 20–32)
Calcium: 9.2 mg/dL (ref 8.6–10.2)
Chloride: 105 mmol/L (ref 98–110)
Creat: 0.66 mg/dL (ref 0.50–1.10)
GFR, Est African American: 131 mL/min/{1.73_m2} (ref >=60)
GFR, Est Non African American: 113 mL/min/{1.73_m2} (ref >=60)
Globulin: 2.7 g/dL (calc) (ref 1.9–3.7)
Glucose, Bld: 92 mg/dL (ref 65–99)
Potassium: 4.4 mmol/L (ref 3.5–5.3)
Sodium: 139 mmol/L (ref 135–146)
Total Bilirubin: 0.2 mg/dL (ref 0.2–1.2)
Total Protein: 6.7 g/dL (ref 6.1–8.1)

## 2019-08-20 LAB — LIPID PANEL
Cholesterol: 196 mg/dL (ref <200)
HDL: 40 mg/dL — ABNORMAL LOW (ref >=50)
LDL Cholesterol (Calc): 126 mg/dL (calc) — ABNORMAL HIGH
Non-HDL Cholesterol (Calc): 156 mg/dL (calc) — ABNORMAL HIGH (ref <130)
Total CHOL/HDL Ratio: 4.9 (calc) (ref <5.0)
Triglycerides: 182 mg/dL — ABNORMAL HIGH (ref <150)

## 2019-08-20 LAB — HEMOGLOBIN A1C
Hgb A1c MFr Bld: 5.7 % of total Hgb — ABNORMAL HIGH (ref <5.7)
Mean Plasma Glucose: 117 (calc)
eAG (mmol/L): 6.5 (calc)

## 2019-08-20 LAB — TSH: TSH: 3.08 mIU/L

## 2020-02-08 ENCOUNTER — Ambulatory Visit (INDEPENDENT_AMBULATORY_CARE_PROVIDER_SITE_OTHER): Payer: 59 | Admitting: Family Medicine

## 2020-02-08 ENCOUNTER — Other Ambulatory Visit: Payer: Self-pay

## 2020-02-08 VITALS — BP 126/89 | HR 84 | Resp 16 | Ht 62.0 in | Wt 245.0 lb

## 2020-02-08 DIAGNOSIS — F411 Generalized anxiety disorder: Secondary | ICD-10-CM

## 2020-02-08 DIAGNOSIS — I499 Cardiac arrhythmia, unspecified: Secondary | ICD-10-CM | POA: Insufficient documentation

## 2020-02-08 DIAGNOSIS — R002 Palpitations: Secondary | ICD-10-CM | POA: Diagnosis not present

## 2020-02-08 DIAGNOSIS — M5416 Radiculopathy, lumbar region: Secondary | ICD-10-CM

## 2020-02-08 DIAGNOSIS — Z0001 Encounter for general adult medical examination with abnormal findings: Secondary | ICD-10-CM | POA: Insufficient documentation

## 2020-02-08 MED ORDER — PREDNISONE 10 MG PO TABS
10.0000 mg | ORAL_TABLET | Freq: Two times a day (BID) | ORAL | 0 refills | Status: DC
Start: 2020-02-08 — End: 2020-05-02

## 2020-02-08 MED ORDER — IBUPROFEN 800 MG PO TABS
800.0000 mg | ORAL_TABLET | Freq: Three times a day (TID) | ORAL | 0 refills | Status: DC
Start: 2020-02-08 — End: 2020-05-02

## 2020-02-08 MED ORDER — BUSPIRONE HCL 5 MG PO TABS: 5.0000 mg | ORAL_TABLET | Freq: Three times a day (TID) | ORAL | 2 refills | Status: DC

## 2020-02-08 MED ORDER — RANITIDINE HCL 300 MG PO TABS: 300.0000 mg | ORAL_TABLET | Freq: Every day | ORAL | 0 refills | Status: DC

## 2020-02-08 MED ORDER — FAMOTIDINE 40 MG PO TABS
40.0000 mg | ORAL_TABLET | Freq: Every day | ORAL | 0 refills | Status: DC
Start: 2020-02-08 — End: 2020-05-02

## 2020-02-08 MED FILL — FAMOTIDINE 40 MG TABLET: 40 | 14 days supply | Qty: 14 | Fill #0

## 2020-02-08 MED FILL — busPIRone HCL 5 MG TABS: 5 | 30 days supply | Qty: 90 | Fill #0

## 2020-02-08 MED FILL — predniSONE 10 MG TABS: 10 | 5 days supply | Qty: 10 | Fill #0

## 2020-02-08 MED FILL — IBUPROFEN 800 MG TABS: 800 | 7 days supply | Qty: 21 | Fill #0

## 2020-02-08 NOTE — Assessment & Plan Note (Signed)
One yearh/o irregular heart rate increasing in frequency 40 to 190 HR range from 02/2019 to current Increased frequency past 4 months, needs Card re eval

## 2020-02-08 NOTE — Progress Notes (Signed)
[name]     MRN: [id number]      DOB: September 20, 1980  HPI: Patient is in for annual physical exam. LBP to left hip up to a 6 intermittently for 6 weeks. C/o increased and uncontrolled stress primarily job related , due to shortage of staff, and very poor sleep C/o irregular heart rate , from the 40's to above 130 recorded ,   Immunization is reviewed , and  Is up to date   PE: BP 126/89   Pulse 84   Resp 16   Ht 5\' 2"  (1.575 m)   Wt 245 lb (111.1 kg)   SpO2 98%   BMI 44.81 kg/m   Pleasant  female, alert and oriented x 3, in no cardio-pulmonary distress. Afebrile. HEENT No facial trauma or asymetry. Sinuses non tender.  Extra occullar muscles intact.. External ears normal, . Neck: supple, no adenopathy,JVD or thyromegaly.No bruits.  Chest: Clear to ascultation bilaterally.No crackles or wheezes. Non tender to palpation   Cardiovascular system; Heart sounds normal,  S1 and  S2 ,no S3.  No murmur, or thrill. Apical beat not displaced Peripheral pulses normal.  Abdomen: Soft, non tender, no organomegaly or masses. No bruits. Bowel sounds normal. No guarding, tenderness or rebound.       Musculoskeletal exam: Decreased ROM of lumbar  spine, adequate in hips , shoulders and knees. No deformity ,swelling or crepitus noted. No muscle wasting or atrophy.   Neurologic: Cranial nerves 2 to 12 intact. Power, tone ,sensation and reflexes normal throughout. No disturbance in gait. No tremor.  Skin: Intact, no ulceration, erythema , scaling or rash noted. Pigmentation normal throughout  Psych; Normal mood and affect. Judgement and concentration normal   Assessment & Plan:  Irregular heart rate One yearh/o irregular heart rate increasing in frequency 40 to 190 HR range from 02/2019 to current Increased frequency past 4 months, needs Card re eval  Encounter for annual general medical examination with abnormal findings in adult Annual exam as  documented. Counseling done  re healthy lifestyle involving commitment to 150 minutes exercise per week, heart healthy diet, and attaining healthy weight.The importance of adequate sleep also discussed. Regular seat belt use and home safety, is also discussed. Changes in health habits are decided on by the patient with goals and time frames  set for achieving them. Immunization and cancer screening needs are specifically addressed at this visit.   Palpitations Reports increased frequency of palpitations and irregular heart rate. Apple watch recording lows in the 40's and rapid rate above 130 at times. Refer to Cardiology. In office EKG shows NSR, or LVH , no ischemia  Morbid obesity (HCC)  Patient re-educated about  the importance of commitment to a  minimum of 150 minutes of exercise per week as able.  The importance of healthy food choices with portion control discussed, as well as eating regularly and within a 12 hour window most days. The need to choose "clean , green" food 50 to 75% of the time is discussed, as well as to make water the primary drink and set a goal of 64 ounces water daily.    Weight /BMI 02/08/2020 08/10/2019 06/08/2019  WEIGHT 245 lb 245 lb 249 lb  HEIGHT 5\' 2"  5\' 2"  5\' 2"   BMI 44.81 kg/m2 44.81 kg/m2 45.54 kg/m2      Lumbar back pain with radiculopathy affecting left lower extremity Increased pain radiating to left buttock in past several weeks , unprovoked short oral anti inflammatory course,  if persists , Neurosurgery to evaluate  GAD (generalized anxiety disorder) Start daily buspar and reach out to employee assistance

## 2020-02-08 NOTE — Patient Instructions (Addendum)
F/u with MD early January, re evaluate chronic problems, call if you need me sooner  New is buspar every 8 hours for anxiety, and please call employee assistance for help also  If still having poor sleep in 2 weeks, send me a message please   New is short course of ibuprofen, prednisone and zantac for low back and left buttock pain, send message for referral to Dr Venetia Maxon if persists  EKG in office today as baseline due to irregular heart rate, and you are referred to Dr Jens Som  It is important that you exercise regularly at least 30 minutes 5 times a week. If you develop chest pain, have severe difficulty breathing, or feel very tired, stop exercising immediately and seek medical attention  Think about what you will eat, plan ahead. Choose " clean, green, fresh or frozen" over canned, processed or packaged foods which are more sugary, salty and fatty. 70 to 75% of food eaten should be vegetables and fruit. Three meals at set times with snacks allowed between meals, but they must be fruit or vegetables. Aim to eat over a 12 hour period , example 7 am to 7 pm, and STOP after  your last meal of the day. Drink water,generally about 64 ounces per day, no other drink is as healthy. Fruit juice is best enjoyed in a healthy way, by EATING the fruit. Thanks for choosing [facility], we consider it a privelige to serve you.

## 2020-02-09 ENCOUNTER — Encounter: Payer: Self-pay | Admitting: Family Medicine

## 2020-02-10 ENCOUNTER — Encounter: Payer: Self-pay | Admitting: Family Medicine

## 2020-02-13 ENCOUNTER — Encounter: Payer: Self-pay | Admitting: Family Medicine

## 2020-02-13 DIAGNOSIS — F411 Generalized anxiety disorder: Secondary | ICD-10-CM | POA: Insufficient documentation

## 2020-02-13 DIAGNOSIS — M5416 Radiculopathy, lumbar region: Secondary | ICD-10-CM | POA: Insufficient documentation

## 2020-02-13 NOTE — Assessment & Plan Note (Signed)
  Patient re-educated about  the importance of commitment to a  minimum of 150 minutes of exercise per week as able.  The importance of healthy food choices with portion control discussed, as well as eating regularly and within a 12 hour window most days. The need to choose "clean , green" food 50 to 75% of the time is discussed, as well as to make water the primary drink and set a goal of 64 ounces water daily.    Weight /BMI 02/08/2020 08/10/2019 06/08/2019  WEIGHT 245 lb 245 lb 249 lb  HEIGHT 5' 2" 5' 2" 5' 2"  BMI 44.81 kg/m2 44.81 kg/m2 45.54 kg/m2     

## 2020-02-13 NOTE — Assessment & Plan Note (Signed)
Start daily buspar and reach out to employee assistance

## 2020-02-13 NOTE — Assessment & Plan Note (Signed)
Increased pain radiating to left buttock in past several weeks , unprovoked short oral anti inflammatory course, if persists , Neurosurgery to evaluate

## 2020-02-13 NOTE — Assessment & Plan Note (Signed)
Reports increased frequency of palpitations and irregular heart rate. Apple watch recording lows in the 40's and rapid rate above 130 at times. Refer to Cardiology. In office EKG shows NSR, or LVH , no ischemia

## 2020-02-13 NOTE — Assessment & Plan Note (Signed)

## 2020-02-18 ENCOUNTER — Encounter: Payer: Self-pay | Admitting: General Practice

## 2020-03-04 ENCOUNTER — Telehealth: Payer: 59 | Admitting: Nurse Practitioner

## 2020-03-04 DIAGNOSIS — R399 Unspecified symptoms and signs involving the genitourinary system: Secondary | ICD-10-CM

## 2020-03-04 DIAGNOSIS — M545 Low back pain, unspecified: Secondary | ICD-10-CM

## 2020-03-04 MED ORDER — NITROFURANTOIN MONOHYD MACRO 100 MG PO CAPS
100.0000 mg | ORAL_CAPSULE | Freq: Two times a day (BID) | ORAL | 0 refills | Status: DC
Start: 2020-03-04 — End: 2020-05-02

## 2020-03-04 NOTE — Addendum Note (Signed)
Addended by: Bennie Pierini on: 03/04/2020 02:31 PM   Modules accepted: Orders

## 2020-03-04 NOTE — Progress Notes (Signed)
Based on what you shared with me it looks like you have uti symptoms with back pain,that should be evaluated in a face to face office visit. Due to the associating back pain you will need a urinalysis and urine culture for proper treatment.    NOTE: If you entered your credit card information for this eVisit, you will not be charged. You may see a "hold" on your card for the $35 but that hold will drop off and you will not have a charge processed.  If you are having a true medical emergency please call 911.     For an urgent face to face visit, [facility] has four urgent care centers for your convenience:   . [facility]    [phone]                  Get Driving Directions  [address] [place], [place] [address] . 10 am to 8 pm Monday-Friday . 12 pm to 8 pm Saturday-Sunday   . [facility]  [phone]                  Get Driving Directions  [address] [place], [place] [address] . 8 am to 8 pm Monday-Friday . 9 am to 6 pm Saturday . 11 am to 6 pm Sunday   . [facility] [place]  [phone]                  Get Driving Directions   [address] [place], [place] [address] . 8 am to 8 pm Monday-Friday . 8 am to 4 pm Saturday-Sunday    . [facility] [place]                    Get Driving Directions  [phone]  [address] , [place] [address]  . Monday-Friday, 12 PM to 6 PM    Your e-visit answers were reviewed by a board certified advanced clinical practitioner to complete your personal care plan.  Thank you for using e-Visits.  

## 2020-03-20 MED FILL — busPIRone HCL 5 MG TABS: 5 | 30 days supply | Qty: 90 | Fill #1

## 2020-05-02 ENCOUNTER — Telehealth: Payer: 59 | Admitting: Family Medicine

## 2020-05-02 ENCOUNTER — Encounter: Payer: Self-pay | Admitting: Family Medicine

## 2020-05-02 ENCOUNTER — Other Ambulatory Visit: Payer: Self-pay

## 2020-05-02 ENCOUNTER — Other Ambulatory Visit: Payer: Self-pay | Admitting: Family Medicine

## 2020-05-02 DIAGNOSIS — R7301 Impaired fasting glucose: Secondary | ICD-10-CM

## 2020-05-02 DIAGNOSIS — E559 Vitamin D deficiency, unspecified: Secondary | ICD-10-CM | POA: Diagnosis not present

## 2020-05-02 DIAGNOSIS — E785 Hyperlipidemia, unspecified: Secondary | ICD-10-CM | POA: Diagnosis not present

## 2020-05-02 DIAGNOSIS — F411 Generalized anxiety disorder: Secondary | ICD-10-CM

## 2020-05-02 DIAGNOSIS — J309 Allergic rhinitis, unspecified: Secondary | ICD-10-CM | POA: Diagnosis not present

## 2020-05-02 DIAGNOSIS — F5104 Psychophysiologic insomnia: Secondary | ICD-10-CM | POA: Diagnosis not present

## 2020-05-02 DIAGNOSIS — Z1159 Encounter for screening for other viral diseases: Secondary | ICD-10-CM

## 2020-05-02 MED ORDER — BUSPIRONE HCL 10 MG PO TABS: 10.0000 mg | ORAL_TABLET | Freq: Two times a day (BID) | ORAL | 3 refills | Status: DC

## 2020-05-02 MED FILL — busPIRone HCL 10 MG TABS: 10 | 30 days supply | Qty: 60 | Fill #0

## 2020-05-02 NOTE — Assessment & Plan Note (Signed)
Hyperlipidemia:Low fat diet discussed and encouraged. ° ° °Lipid Panel  °Lab Results  °Component Value Date  ° CHOL 196 08/19/2019  ° HDL 40 (L) 08/19/2019  ° LDLCALC 126 (H) 08/19/2019  ° TRIG 182 (H) 08/19/2019  ° CHOLHDL 4.9 08/19/2019  ° ° ° °Updated lab needed at/ before next visit. ° °

## 2020-05-02 NOTE — Assessment & Plan Note (Signed)
Controlled, no change in medication On buspar ,  This has improved

## 2020-05-02 NOTE — Assessment & Plan Note (Signed)
  Patient re-educated about  the importance of commitment to a  minimum of 150 minutes of exercise per week as able.  The importance of healthy food choices with portion control discussed, as well as eating regularly and within a 12 hour window most days. The need to choose "clean , green" food 50 to 75% of the time is discussed, as well as to make water the primary drink and set a goal of 64 ounces water daily.    Weight /BMI 02/08/2020 08/10/2019 06/08/2019  WEIGHT 245 lb 245 lb 249 lb  HEIGHT 5\' 2"  5\' 2"  5\' 2"   BMI 44.81 kg/m2 44.81 kg/m2 45.54 kg/m2

## 2020-05-02 NOTE — Patient Instructions (Addendum)
F/U with MD in 10 weeks, video, call if you need me sooner  New dose of buspar is 10 mg one twice daily  Please get fasting CBC, lipid, cmp and EGFr, TSH, hepatitis C screen and Vit D and HBA1C 3 to 7 days before next visit ( [facility])  Please try to "find" 10 to 15 minutes each day of " me" time/ down time for mental, emotional and spiritual wellbeing . We ALL need this  It is important that you exercise regularly at least 30 minutes 5 times a week. If you develop chest pain, have severe difficulty breathing, or feel very tired, stop exercising immediately and seek medical attention   Think about what you will eat, plan ahead. Choose " clean, green, fresh or frozen" over canned, processed or packaged foods which are more sugary, salty and fatty. 70 to 75% of food eaten should be vegetables and fruit. Three meals at set times with snacks allowed between meals, but they must be fruit or vegetables. Aim to eat over a 12 hour period , example 7 am to 7 pm, and STOP after  your last meal of the day. Drink water,generally about 64 ounces per day, no other drink is as healthy. Fruit juice is best enjoyed in a healthy way, by EATING the fruit. Thanks for choosing [facility], we consider it a privelige to serve you. Best for 2022!  Marland Kitchen

## 2020-05-02 NOTE — Assessment & Plan Note (Signed)
Controlled, no change in medication  

## 2020-05-02 NOTE — Assessment & Plan Note (Signed)
Increase buspar dose to 10 mg twice daily Pt encouraged to make " down time" for herself also and to start some form orf regular exercise

## 2020-05-02 NOTE — Progress Notes (Signed)
Virtual Visit via video  I connected with [name] on 05/02/20 at 11:00 AM EST by telephone and verified that I am speaking with the correct person using two identifiers.  Location: Patient: home Provider: work     History of Present Illness: F/U anxiety and chronic problems Doing better with new medication however forgets middle dose or last dose , requests twice daily dosing No regular exercise as yet, and no space in her home. Plans to work on more structured eating instead of 2 meals then snacking through the day. Drinks water and 1 cup of coffee Palpitations are better, has not seen cardiology yet but will schedule Denies recent fever or chills. Denies sinus pressure, nasal congestion, ear pain or sore throat. Denies chest congestion, productive cough or wheezing. Denies chest pains, palpitations and leg swelling Denies abdominal pain, nausea, vomiting,diarrhea or constipation.   Denies dysuria, frequency, hesitancy or incontinence. Denies joint pain, swelling and limitation in mobility. Denies headaches, seizures, numbness, or tingling. Denies depression, uncontrolled anxiety or insomnia. Denies skin break down or rash.       Observations/Objective:  There were no vitals taken for this visit. Good communication with no confusion and intact memory. Alert and oriented x 3 No signs of respiratory distress during speech   Assessment and Plan: GAD (generalized anxiety disorder) Increase buspar dose to 10 mg twice daily Pt encouraged to make " down time" for herself also and to start some form orf regular exercise  Hyperlipidemia Hyperlipidemia:Low fat diet discussed and encouraged.   Lipid Panel  Lab Results  Component Value Date   CHOL 196 08/19/2019   HDL 40 (L) 08/19/2019   LDLCALC 126 (H) 08/19/2019   TRIG 182 (H) 08/19/2019   CHOLHDL 4.9 08/19/2019   Updated lab needed at/ before next visit.     Insomnia Controlled, no change in  medication On buspar ,  This has improved  Morbid obesity (HCC)  Patient re-educated about  the importance of commitment to a  minimum of 150 minutes of exercise per week as able.  The importance of healthy food choices with portion control discussed, as well as eating regularly and within a 12 hour window most days. The need to choose "clean , green" food 50 to 75% of the time is discussed, as well as to make water the primary drink and set a goal of 64 ounces water daily.    Weight /BMI 02/08/2020 08/10/2019 06/08/2019  WEIGHT 245 lb 245 lb 249 lb  HEIGHT 5\' 2"  5\' 2"  5\' 2"   BMI 44.81 kg/m2 44.81 kg/m2 45.54 kg/m2      Allergic rhinitis Controlled, no change in medication     Follow Up Instructions:    I discussed the assessment and treatment plan with the patient. The patient was provided an opportunity to ask questions and all were answered. The patient agreed with the plan and demonstrated an understanding of the instructions.   The patient was advised to call back or seek an in-person evaluation if the symptoms worsen or if the condition fails to improve as anticipated.  I provided 25 minutes of non-face-to-face time during this encounter.   , MD

## 2020-06-06 MED FILL — busPIRone HCL 10 MG TABS: 10 | 30 days supply | Qty: 60 | Fill #1

## 2020-07-20 MED FILL — Buspirone HCl Tab 10 MG: ORAL | 30 days supply | Qty: 60 | Fill #0 | Status: AC

## 2020-07-21 ENCOUNTER — Other Ambulatory Visit (HOSPITAL_COMMUNITY): Payer: Self-pay

## 2020-08-08 DIAGNOSIS — H32 Chorioretinal disorders in diseases classified elsewhere: Secondary | ICD-10-CM | POA: Diagnosis not present

## 2020-08-08 DIAGNOSIS — H5211 Myopia, right eye: Secondary | ICD-10-CM | POA: Diagnosis not present

## 2020-08-23 MED FILL — Buspirone HCl Tab 10 MG: ORAL | 30 days supply | Qty: 60 | Fill #1 | Status: AC

## 2020-08-24 ENCOUNTER — Other Ambulatory Visit (HOSPITAL_COMMUNITY): Payer: Self-pay

## 2020-10-03 ENCOUNTER — Other Ambulatory Visit: Payer: Self-pay | Admitting: Family Medicine

## 2020-10-03 ENCOUNTER — Other Ambulatory Visit (HOSPITAL_COMMUNITY): Payer: Self-pay

## 2020-10-03 MED ORDER — BUSPIRONE HCL 10 MG PO TABS
10.0000 mg | ORAL_TABLET | Freq: Two times a day (BID) | ORAL | 3 refills | Status: DC
  Filled 2020-10-03: qty 60, 30d supply, fill #0
  Filled 2020-11-07: qty 60, 30d supply, fill #1
  Filled 2020-12-18: qty 60, 30d supply, fill #2
  Filled 2021-02-01 – 2021-02-16 (×2): qty 60, 30d supply, fill #3

## 2020-11-08 ENCOUNTER — Other Ambulatory Visit (HOSPITAL_COMMUNITY): Payer: Self-pay

## 2020-12-19 ENCOUNTER — Other Ambulatory Visit (HOSPITAL_COMMUNITY): Payer: Self-pay

## 2020-12-22 ENCOUNTER — Encounter: Payer: Self-pay | Admitting: Family Medicine

## 2021-01-31 ENCOUNTER — Ambulatory Visit: Payer: 59 | Admitting: Family Medicine

## 2021-02-02 ENCOUNTER — Other Ambulatory Visit (HOSPITAL_COMMUNITY): Payer: Self-pay

## 2021-02-12 ENCOUNTER — Other Ambulatory Visit (HOSPITAL_COMMUNITY): Payer: Self-pay

## 2021-02-16 ENCOUNTER — Other Ambulatory Visit (HOSPITAL_COMMUNITY): Payer: Self-pay

## 2021-02-16 ENCOUNTER — Telehealth: Payer: 59 | Admitting: Nurse Practitioner

## 2021-02-16 DIAGNOSIS — N3000 Acute cystitis without hematuria: Secondary | ICD-10-CM | POA: Diagnosis not present

## 2021-02-16 MED ORDER — CEPHALEXIN 500 MG PO CAPS: 500.0000 mg | ORAL_CAPSULE | Freq: Two times a day (BID) | ORAL | 0 refills | Status: DC

## 2021-02-16 NOTE — Progress Notes (Signed)

## 2021-03-14 ENCOUNTER — Ambulatory Visit: Payer: 59 | Admitting: Family Medicine

## 2021-04-04 ENCOUNTER — Other Ambulatory Visit: Payer: Self-pay

## 2021-04-04 ENCOUNTER — Other Ambulatory Visit (HOSPITAL_COMMUNITY): Payer: Self-pay

## 2021-04-04 ENCOUNTER — Ambulatory Visit: Payer: 59 | Admitting: Family Medicine

## 2021-04-04 ENCOUNTER — Encounter: Payer: Self-pay | Admitting: Family Medicine

## 2021-04-04 VITALS — BP 135/92 | HR 96 | Resp 16 | Ht 62.0 in | Wt 238.4 lb

## 2021-04-04 DIAGNOSIS — E785 Hyperlipidemia, unspecified: Secondary | ICD-10-CM | POA: Diagnosis not present

## 2021-04-04 DIAGNOSIS — Z1159 Encounter for screening for other viral diseases: Secondary | ICD-10-CM

## 2021-04-04 DIAGNOSIS — R7302 Impaired glucose tolerance (oral): Secondary | ICD-10-CM | POA: Diagnosis not present

## 2021-04-04 DIAGNOSIS — E559 Vitamin D deficiency, unspecified: Secondary | ICD-10-CM

## 2021-04-04 DIAGNOSIS — J309 Allergic rhinitis, unspecified: Secondary | ICD-10-CM

## 2021-04-04 DIAGNOSIS — I499 Cardiac arrhythmia, unspecified: Secondary | ICD-10-CM

## 2021-04-04 DIAGNOSIS — F411 Generalized anxiety disorder: Secondary | ICD-10-CM

## 2021-04-04 MED ORDER — BUSPIRONE HCL 10 MG PO TABS
10.0000 mg | ORAL_TABLET | Freq: Two times a day (BID) | ORAL | 1 refills | Status: DC
  Filled 2021-04-04: qty 180, 90d supply, fill #0
  Filled 2021-07-17: qty 180, 90d supply, fill #1

## 2021-04-04 MED ORDER — FLUTICASONE PROPIONATE 50 MCG/ACT NA SUSP
2.0000 | Freq: Every day | NASAL | 6 refills | Status: AC
  Filled 2021-04-04: qty 16, 30d supply, fill #0

## 2021-04-04 MED ORDER — METOPROLOL TARTRATE 25 MG PO TABS
25.0000 mg | ORAL_TABLET | Freq: Every day | ORAL | 1 refills | Status: DC
  Filled 2021-04-04: qty 90, 90d supply, fill #0
  Filled 2022-03-28: qty 90, 90d supply, fill #1

## 2021-04-04 NOTE — Assessment & Plan Note (Signed)
Hyperlipidemia:Low fat diet discussed and encouraged.   Lipid Panel  Lab Results  Component Value Date   CHOL 196 08/19/2019   HDL 40 (L) 08/19/2019   LDLCALC 126 (H) 08/19/2019   TRIG 182 (H) 08/19/2019   CHOLHDL 4.9 08/19/2019     Updated lab needed at/ before next visit.

## 2021-04-04 NOTE — Assessment & Plan Note (Signed)
Refill flonase  For as needed use

## 2021-04-04 NOTE — Progress Notes (Signed)
° °  [name]     MRN: [id number]      DOB: 11/12/1980   HPI [name] is here for follow up and re-evaluation of chronic medical conditions, medication management and review of any available recent lab and radiology data.  Preventive health is updated, specifically  Cancer screening and Immunization.   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 No interest in therapy currently ROS Denies recent fever or chills. Denies sinus pressure, nasal congestion, ear pain or sore throat. Denies chest congestion, productive cough or wheezing. Denies chest pains, palpitations and leg swelling Denies abdominal pain, nausea, vomiting,diarrhea or constipation.   Denies dysuria, frequency, hesitancy or incontinence. Denies joint pain, swelling and limitation in mobility. Denies headaches, seizures, numbness, or tingling.  Denies skin break down or rash.   PE  BP (!) 135/92    Pulse 96    Resp 16    Ht 5\' 2"  (1.575 m)    Wt 238 lb 6.4 oz (108.1 kg)    SpO2 96%    BMI 43.60 kg/m   Patient alert and oriented and in no cardiopulmonary distress.  HEENT: No facial asymmetry, EOMI,     Neck supple .  Chest: Clear to auscultation bilaterally.  CVS: S1, S2 no murmurs, no S3.Regular rate.  ABD: Soft non tender.   Ext: No edema  MS: Adequate ROM spine, shoulders, hips and knees.  Skin: Intact, no ulcerations or rash noted.  Psych: Good eye contact, tearful  affect. Memory intact  anxious and mildly  depressed appearing.  CNS: CN 2-12 intact, power,  normal throughout.no focal deficits noted.   Assessment & Plan  Hyperlipidemia Hyperlipidemia:Low fat diet discussed and encouraged.   Lipid Panel  Lab Results  Component Value Date   CHOL 196 08/19/2019   HDL 40 (L) 08/19/2019   LDLCALC 126 (H) 08/19/2019   TRIG 182 (H) 08/19/2019   CHOLHDL 4.9 08/19/2019      Updated lab needed at/ before next visit.   GAD (generalized anxiety disorder) Increased due to current life stresses, resume  buspar, therapy offered , work on exercise and sleep habits  Allergic rhinitis Refill flonase  For as needed use  Irregular heart rate Needs to remain on daily Beta blocker, same refilled  Morbid obesity (HCC)  Patient re-educated about  the importance of commitment to a  minimum of 150 minutes of exercise per week as able.  The importance of healthy food choices with portion control discussed, as well as eating regularly and within a 12 hour window most days. The need to choose "clean , green" food 50 to 75% of the time is discussed, as well as to make water the primary drink and set a goal of 64 ounces water daily.    Weight /BMI 04/04/2021 02/08/2020 08/10/2019  WEIGHT 238 lb 6.4 oz 245 lb 245 lb  HEIGHT 5\' 2"  5\' 2"  5\' 2"   BMI 43.6 kg/m2 44.81 kg/m2 44.81 kg/m2

## 2021-04-04 NOTE — Assessment & Plan Note (Signed)
Needs to remain on daily Beta blocker, same refilled

## 2021-04-04 NOTE — Assessment & Plan Note (Signed)
°  Patient re-educated about  the importance of commitment to a  minimum of 150 minutes of exercise per week as able.  The importance of healthy food choices with portion control discussed, as well as eating regularly and within a 12 hour window most days. The need to choose "clean , green" food 50 to 75% of the time is discussed, as well as to make water the primary drink and set a goal of 64 ounces water daily.    Weight /BMI 04/04/2021 02/08/2020 08/10/2019  WEIGHT 238 lb 6.4 oz 245 lb 245 lb  HEIGHT 5\' 2"  5\' 2"  5\' 2"   BMI 43.6 kg/m2 44.81 kg/m2 44.81 kg/m2

## 2021-04-04 NOTE — Patient Instructions (Addendum)
F/U in 4 months, call if you need me before  Labs today, please reorder all Jan labs   Meds will be refilled for next 6 months  It is important that you exercise regularly at least 30 minutes 5 times a week. If you develop chest pain, have severe difficulty breathing, or feel very tired, stop exercising immediately and seek medical attention    Self care is crucial  Sleep is a part of health, aim for 6 /day    Thanks for choosing [facility], we consider it a privelige to serve you.

## 2021-04-04 NOTE — Assessment & Plan Note (Signed)
Increased due to current life stresses, resume  buspar, therapy offered , work on exercise and sleep habits

## 2021-04-05 LAB — LIPID PANEL
Chol/HDL Ratio: 4.9 ratio — ABNORMAL HIGH (ref 0.0–4.4)
Cholesterol, Total: 206 mg/dL — ABNORMAL HIGH (ref 100–199)
HDL: 42 mg/dL (ref >39)
LDL Chol Calc (NIH): 144 mg/dL — ABNORMAL HIGH (ref 0–99)
Triglycerides: 110 mg/dL (ref 0–149)
VLDL Cholesterol Cal: 20 mg/dL (ref 5–40)

## 2021-04-05 LAB — CBC
Hematocrit: 43.5 % (ref 34.0–46.6)
Hemoglobin: 14.6 g/dL (ref 11.1–15.9)
MCH: 28.9 pg (ref 26.6–33.0)
MCHC: 33.6 g/dL (ref 31.5–35.7)
MCV: 86 fL (ref 79–97)
Platelets: 376 10*3/uL (ref 150–450)
RBC: 5.05 x10E6/uL (ref 3.77–5.28)
RDW: 13.1 % (ref 11.7–15.4)
WBC: 10.3 10*3/uL (ref 3.4–10.8)

## 2021-04-05 LAB — CMP14+EGFR
ALT: 32 IU/L (ref 0–32)
AST: 21 IU/L (ref 0–40)
Albumin/Globulin Ratio: 1.5 (ref 1.2–2.2)
Albumin: 4.2 g/dL (ref 3.8–4.8)
Alkaline Phosphatase: 118 IU/L (ref 44–121)
BUN/Creatinine Ratio: 15 (ref 9–23)
BUN: 12 mg/dL (ref 6–20)
Bilirubin Total: <0.2 mg/dL (ref 0.0–1.2)
CO2: 22 mmol/L (ref 20–29)
Calcium: 9.5 mg/dL (ref 8.7–10.2)
Chloride: 101 mmol/L (ref 96–106)
Creatinine, Ser: 0.78 mg/dL (ref 0.57–1.00)
Globulin, Total: 2.8 g/dL (ref 1.5–4.5)
Glucose: 108 mg/dL — ABNORMAL HIGH (ref 70–99)
Potassium: 3.8 mmol/L (ref 3.5–5.2)
Sodium: 138 mmol/L (ref 134–144)
Total Protein: 7 g/dL (ref 6.0–8.5)
eGFR: 100 mL/min/{1.73_m2} (ref >59)

## 2021-04-05 LAB — HEPATITIS C ANTIBODY: Hep C Virus Ab: <0.1 s/co ratio (ref 0.0–0.9)

## 2021-04-05 LAB — HEMOGLOBIN A1C
Est. average glucose Bld gHb Est-mCnc: 114 mg/dL
Hgb A1c MFr Bld: 5.6 % (ref 4.8–5.6)

## 2021-04-05 LAB — TSH: TSH: 2.63 u[IU]/mL (ref 0.450–4.500)

## 2021-06-29 ENCOUNTER — Telehealth: Payer: 59 | Admitting: Physician Assistant

## 2021-06-29 DIAGNOSIS — J208 Acute bronchitis due to other specified organisms: Secondary | ICD-10-CM | POA: Diagnosis not present

## 2021-06-29 DIAGNOSIS — B9689 Other specified bacterial agents as the cause of diseases classified elsewhere: Secondary | ICD-10-CM | POA: Diagnosis not present

## 2021-06-29 MED ORDER — BENZONATATE 100 MG PO CAPS: 100.0000 mg | ORAL_CAPSULE | Freq: Three times a day (TID) | ORAL | 0 refills | Status: DC | PRN

## 2021-06-29 MED ORDER — PREDNISONE 10 MG (21) PO TBPK: ORAL_TABLET | ORAL | 0 refills | Status: DC

## 2021-06-29 MED ORDER — AZITHROMYCIN 250 MG PO TABS: ORAL_TABLET | ORAL | 0 refills | Status: AC

## 2021-06-29 NOTE — Progress Notes (Signed)

## 2021-07-18 ENCOUNTER — Other Ambulatory Visit (HOSPITAL_COMMUNITY): Payer: Self-pay

## 2021-08-01 ENCOUNTER — Ambulatory Visit: Payer: 59 | Admitting: Family Medicine

## 2021-08-01 ENCOUNTER — Other Ambulatory Visit (HOSPITAL_COMMUNITY): Payer: Self-pay

## 2021-08-01 ENCOUNTER — Encounter: Payer: Self-pay | Admitting: Family Medicine

## 2021-08-01 VITALS — BP 126/87 | HR 76 | Ht 62.0 in | Wt 239.1 lb

## 2021-08-01 DIAGNOSIS — J309 Allergic rhinitis, unspecified: Secondary | ICD-10-CM

## 2021-08-01 DIAGNOSIS — F411 Generalized anxiety disorder: Secondary | ICD-10-CM

## 2021-08-01 MED ORDER — SEMAGLUTIDE-WEIGHT MANAGEMENT 0.25 MG/0.5ML ~~LOC~~ SOAJ
0.2500 mg | SUBCUTANEOUS | 0 refills | Status: DC
  Filled 2021-08-01 – 2021-08-06 (×2): qty 2, 28d supply, fill #0

## 2021-08-01 MED ORDER — BUSPIRONE HCL 10 MG PO TABS
10.0000 mg | ORAL_TABLET | Freq: Two times a day (BID) | ORAL | 3 refills | Status: DC
  Filled 2021-08-01 – 2021-11-10 (×2): qty 180, 90d supply, fill #0
  Filled 2022-02-12: qty 180, 90d supply, fill #1
  Filled 2022-05-29: qty 180, 90d supply, fill #2

## 2021-08-01 MED ORDER — SEMAGLUTIDE-WEIGHT MANAGEMENT 0.5 MG/0.5ML ~~LOC~~ SOAJ
0.5000 mg | SUBCUTANEOUS | 1 refills | Status: DC
  Filled 2021-08-01 – 2021-08-13 (×2): qty 2, 28d supply, fill #0
  Filled 2021-09-18: qty 2, 28d supply, fill #1

## 2021-08-01 NOTE — Assessment & Plan Note (Signed)
Controlled, no change in medication  

## 2021-08-01 NOTE — Progress Notes (Signed)
? ?  [name]     MRN: [id number]      DOB: 03-26-1981 ? ? ?HPI ?Ms. Schiele is here for follow up and re-evaluation of chronic medical conditions, medication management and review of any available recent lab and radiology data.  ?Preventive health is updated, specifically  Cancer screening and Immunization.   ?Questions or concerns regarding consultations or procedures which the PT has had in the interim are  addressed. ?The PT denies any adverse reactions to current medications since the last visit.  ?Is intereste in medication to help with weight loss and is motivated to change lifestyle ? ?ROS ?Denies recent fever or chills. ?Denies sinus pressure, nasal congestion, ear pain or sore throat. ?Denies chest congestion, productive cough or wheezing. ?Denies chest pains, palpitations and leg swelling ?Denies abdominal pain, nausea, vomiting,diarrhea or constipation.   ?Denies dysuria, frequency, hesitancy or incontinence. ?Denies joint pain, swelling and limitation in mobility. ?Denies headaches, seizures, numbness, or tingling. ?Denies depression, anxiety or insomnia. ?Denies skin break down or rash. ? ? ?PE ? ?BP 126/87   Pulse 76   Ht 5\' 2"  (1.575 m)   Wt 239 lb 1.3 oz (108.4 kg)   SpO2 96%   BMI 43.73 kg/m?  ? ?Patient alert and oriented and in no cardiopulmonary distress. ? ?HEENT: No facial asymmetry, EOMI,     Neck supple . ? ?Chest: Clear to auscultation bilaterally. ? ?CVS: S1, S2 no murmurs, no S3.Regular rate. ? ?ABD: Soft non tender.  ? ?Ext: No edema ? ?MS: Adequate ROM spine, shoulders, hips and knees. ? ?Skin: Intact, no ulcerations or rash noted. ? ?Psych: Good eye contact, normal affect. Memory intact not anxious or depressed appearing. ? ?CNS: CN 2-12 intact, power,  normal throughout.no focal deficits noted. ? ? ?Assessment & Plan ? ?Morbid obesity (HCC) ? ?Patient re-educated about  the importance of commitment to a  minimum of 150 minutes of exercise per week as able. ? ?The importance of  healthy food choices with portion control discussed, as well as eating regularly and within a 12 hour window most days. ?The need to choose "clean , green" food 50 to 75% of the time is discussed, as well as to make water the primary drink and set a goal of 64 ounces water daily. ? ?  ? ?  08/01/2021  ?  9:40 AM 04/04/2021  ? 11:35 AM 02/08/2020  ?  3:36 PM  ?Weight /BMI  ?Weight 239 lb 1.3 oz 238 lb 6.4 oz 245 lb  ?Height 5\' 2"  (1.575 m) 5\' 2"  (1.575 m) 5\' 2"  (1.575 m)  ?BMI 43.73 kg/m2 43.6 kg/m2 44.81 kg/m2  ? ? ?Start and titrate up as tolerated weekly wegovy ? ?GAD (generalized anxiety disorder) ?Controlled, no change in medication ? ? ?Allergic rhinitis ?Controlled, no change in medication ? ? ?

## 2021-08-01 NOTE — Assessment & Plan Note (Signed)
?  Patient re-educated about  the importance of commitment to a  minimum of 150 minutes of exercise per week as able. ? ?The importance of healthy food choices with portion control discussed, as well as eating regularly and within a 12 hour window most days. ?The need to choose "clean , green" food 50 to 75% of the time is discussed, as well as to make water the primary drink and set a goal of 64 ounces water daily. ? ?  ? ?  08/01/2021  ?  9:40 AM 04/04/2021  ? 11:35 AM 02/08/2020  ?  3:36 PM  ?Weight /BMI  ?Weight 239 lb 1.3 oz 238 lb 6.4 oz 245 lb  ?Height 5\' 2"  (1.575 m) 5\' 2"  (1.575 m) 5\' 2"  (1.575 m)  ?BMI 43.73 kg/m2 43.6 kg/m2 44.81 kg/m2  ? ? ?Start and titrate up as tolerated weekly wegovy ?

## 2021-08-01 NOTE — Patient Instructions (Signed)
F/U in 10 weeks , call if you need me sooner ? ?Continue buspar as before ? ?New for help with appetite and weight loss is once weekly Wegovy as discussed ? ?It is important that you exercise regularly at least 30 minutes 5 times a week. If you develop chest pain, have severe difficulty breathing, or feel very tired, stop exercising immediately and seek medical attention  ? ?Think about what you will eat, plan ahead. ?Choose " clean, green, fresh or frozen" over canned, processed or packaged foods which are more sugary, salty and fatty. ?70 to 75% of food eaten should be vegetables and fruit. ?Three meals at set times with snacks allowed between meals, but they must be fruit or vegetables. ?Aim to eat over a 12 hour period , example 7 am to 7 pm, and STOP after  your last meal of the day. ?Drink water,generally about 64 ounces per day, no other drink is as healthy. Fruit juice is best enjoyed in a healthy way, by EATING the fruit. ? ?Thanks for choosing [facility], we consider it a privelige to serve you. ? ?

## 2021-08-06 ENCOUNTER — Other Ambulatory Visit (HOSPITAL_COMMUNITY): Payer: Self-pay

## 2021-08-13 ENCOUNTER — Other Ambulatory Visit (HOSPITAL_COMMUNITY): Payer: Self-pay

## 2021-09-19 ENCOUNTER — Encounter: Payer: Self-pay | Admitting: Family Medicine

## 2021-09-19 ENCOUNTER — Other Ambulatory Visit (HOSPITAL_COMMUNITY): Payer: Self-pay

## 2021-09-20 NOTE — Telephone Encounter (Signed)
Left voice to call office to schedule an in office appointment. Sent a [employer] also

## 2021-09-27 ENCOUNTER — Ambulatory Visit: Payer: 59 | Admitting: Family Medicine

## 2021-09-27 ENCOUNTER — Other Ambulatory Visit (HOSPITAL_COMMUNITY): Payer: Self-pay

## 2021-09-27 ENCOUNTER — Encounter: Payer: Self-pay | Admitting: Family Medicine

## 2021-09-27 VITALS — BP 131/91 | HR 76 | Resp 16 | Ht 62.0 in | Wt 220.4 lb

## 2021-09-27 DIAGNOSIS — F411 Generalized anxiety disorder: Secondary | ICD-10-CM | POA: Diagnosis not present

## 2021-09-27 DIAGNOSIS — J309 Allergic rhinitis, unspecified: Secondary | ICD-10-CM

## 2021-09-27 MED ORDER — SEMAGLUTIDE-WEIGHT MANAGEMENT 0.5 MG/0.5ML ~~LOC~~ SOAJ
0.5000 mg | SUBCUTANEOUS | 2 refills | Status: DC
  Filled 2021-09-27: qty 2, 28d supply, fill #0

## 2021-09-27 MED ORDER — PHENTERMINE HCL 15 MG PO CAPS
15.0000 mg | ORAL_CAPSULE | ORAL | 1 refills | Status: DC
  Filled 2021-09-27: qty 30, 30d supply, fill #0
  Filled 2021-12-11: qty 30, 30d supply, fill #1

## 2021-09-27 NOTE — Patient Instructions (Addendum)
F/U in 2 months, call if you need me sooner  CONGRATS on following your meal plan, with excellent weight loss  Try low dose phentermine daily o r 2 to 4 days per week IF NEEDED to control appetite while yo wait for wegovy  Thanks for choosing [facility], we consider it a privelige to serve you.

## 2021-10-05 ENCOUNTER — Ambulatory Visit: Payer: 59 | Admitting: Family Medicine

## 2021-10-08 ENCOUNTER — Encounter: Payer: Self-pay | Admitting: Family Medicine

## 2021-10-08 NOTE — Progress Notes (Signed)
   [name]     MRN: [id number]      DOB: 10-21-1980   HPI [name] is here for follow up and re-evaluation of chronic medical conditions, medication management and review of any available recent lab and radiology data.  Preventive health is updated, specifically  Cancer screening and Immunization.   Questions or concerns regarding consultations or procedures which the PT has had in the interim are  addressed. The PT denies any adverse reactions to current medications since the last visit.  Has been doing very well on wegovy, unfortunately on back order short term wants alternative if possible  ROS Denies recent fever or chills. Denies sinus pressure, nasal congestion, ear pain or sore throat. Denies chest congestion, productive cough or wheezing. Denies chest pains, palpitations and leg swelling Denies abdominal pain, nausea, vomiting,diarrhea or constipation.   Denies dysuria, frequency, hesitancy or incontinence. Denies joint pain, swelling and limitation in mobility. Denies headaches, seizures, numbness, or tingling. Denies depression, anxiety or insomnia. Denies skin break down or rash.   PE  BP (!) 131/91   Pulse 76   Resp 16   Ht 5\' 2"  (1.575 m)   Wt 220 lb 6.4 oz (100 kg)   SpO2 97%   BMI 40.31 kg/m   Patient alert and oriented and in no cardiopulmonary distress.  HEENT: No facial asymmetry, EOMI,     Neck supple .  Chest: Clear to auscultation bilaterally.  CVS: S1, S2 no murmurs, no S3.Regular rate.  ABD: Soft non tender.   Ext: No edema  MS: Adequate ROM spine, shoulders, hips and knees.  Skin: Intact, no ulcerations or rash noted.  Psych: Good eye contact, normal affect. Memory intact not anxious or depressed appearing.  CNS: CN 2-12 intact, power,  normal throughout.no focal deficits noted.   Assessment & Plan  GAD (generalized anxiety disorder) Controlled, no change in medication   Morbid obesity (HCC) Improved markedly, short term  phentermine low dose as needed, while awaiting wegovy  Patient re-educated about  the importance of commitment to a  minimum of 150 minutes of exercise per week as able.  The importance of healthy food choices with portion control discussed, as well as eating regularly and within a 12 hour window most days. The need to choose "clean , green" food 50 to 75% of the time is discussed, as well as to make water the primary drink and set a goal of 64 ounces water daily.       09/27/2021    1:05 PM 08/01/2021    9:40 AM 04/04/2021   11:35 AM  Weight /BMI  Weight 220 lb 6.4 oz 239 lb 1.3 oz 238 lb 6.4 oz  Height 5\' 2"  (1.575 m) 5\' 2"  (1.575 m) 5\' 2"  (1.575 m)  BMI 40.31 kg/m2 43.73 kg/m2 43.6 kg/m2      Allergic rhinitis Controlled, no change in medication

## 2021-10-08 NOTE — Assessment & Plan Note (Signed)
Controlled, no change in medication  

## 2021-10-10 ENCOUNTER — Ambulatory Visit: Payer: 59 | Admitting: Family Medicine

## 2021-11-10 ENCOUNTER — Other Ambulatory Visit (HOSPITAL_COMMUNITY): Payer: Self-pay

## 2021-11-12 ENCOUNTER — Other Ambulatory Visit (HOSPITAL_COMMUNITY): Payer: Self-pay

## 2021-11-28 ENCOUNTER — Ambulatory Visit: Payer: 59 | Admitting: Family Medicine

## 2021-11-30 ENCOUNTER — Ambulatory Visit: Payer: 59 | Admitting: Family Medicine

## 2021-12-07 ENCOUNTER — Ambulatory Visit: Payer: 59 | Admitting: Family Medicine

## 2021-12-07 ENCOUNTER — Encounter: Payer: Self-pay | Admitting: Family Medicine

## 2021-12-07 ENCOUNTER — Other Ambulatory Visit (HOSPITAL_COMMUNITY): Payer: Self-pay

## 2021-12-07 VITALS — BP 121/88 | HR 70 | Ht 62.0 in | Wt 212.0 lb

## 2021-12-07 DIAGNOSIS — E785 Hyperlipidemia, unspecified: Secondary | ICD-10-CM

## 2021-12-07 DIAGNOSIS — F900 Attention-deficit hyperactivity disorder, predominantly inattentive type: Secondary | ICD-10-CM

## 2021-12-07 DIAGNOSIS — F411 Generalized anxiety disorder: Secondary | ICD-10-CM

## 2021-12-07 DIAGNOSIS — J309 Allergic rhinitis, unspecified: Secondary | ICD-10-CM | POA: Diagnosis not present

## 2021-12-07 DIAGNOSIS — E559 Vitamin D deficiency, unspecified: Secondary | ICD-10-CM

## 2021-12-07 MED ORDER — AMPHETAMINE-DEXTROAMPHET ER 10 MG PO CP24
10.0000 mg | ORAL_CAPSULE | Freq: Every day | ORAL | 0 refills | Status: DC
  Filled 2021-12-07: qty 30, 30d supply, fill #0

## 2021-12-07 MED ORDER — SEMAGLUTIDE-WEIGHT MANAGEMENT 0.5 MG/0.5ML ~~LOC~~ SOAJ
0.5000 mg | SUBCUTANEOUS | 0 refills | Status: DC
  Filled 2021-12-07: qty 6, 84d supply, fill #0

## 2021-12-07 NOTE — Patient Instructions (Addendum)
F/U in 3 weeks, phone visit to re evaluate ADHD  F/U in 11 weeks for weight re eval  Fasting CBC, lipid, cmp and EGFR , TSH and vit D in 11 weeks  Congrats on weight loss , keep it up  It is important that you exercise regularly at least 30 minutes 5 times a week. If you develop chest pain, have severe difficulty breathing, or feel very tired, stop exercising immediately and seek medical attention       Thanks for choosing [facility], we consider it a privelige to serve you.

## 2021-12-11 ENCOUNTER — Other Ambulatory Visit (HOSPITAL_COMMUNITY): Payer: Self-pay

## 2021-12-12 ENCOUNTER — Other Ambulatory Visit (HOSPITAL_COMMUNITY): Payer: Self-pay

## 2021-12-17 ENCOUNTER — Encounter: Payer: Self-pay | Admitting: Family Medicine

## 2021-12-17 DIAGNOSIS — F909 Attention-deficit hyperactivity disorder, unspecified type: Secondary | ICD-10-CM | POA: Insufficient documentation

## 2021-12-17 NOTE — Assessment & Plan Note (Signed)
Controlled, no change in medication  

## 2021-12-17 NOTE — Assessment & Plan Note (Signed)
Start adderall 10 mg daily re eval in 3 weeks

## 2021-12-17 NOTE — Progress Notes (Signed)
   [name]     MRN: [id number]      DOB: 1980/09/16   HPI [name] is here for follow up and re-evaluation of chronic medical conditions, medication management and review of any available recent lab and radiology data.  Preventive health is updated, specifically  Cancer screening and Immunization.    The PT denies any adverse reactions to current medications since the last visit.  C/o undiagnosed ADHD, feels she will benefit from treatment   ROS Denies recent fever or chills. Denies sinus pressure, nasal congestion, ear pain or sore throat. Denies chest congestion, productive cough or wheezing. Denies chest pains, palpitations and leg swelling Denies abdominal pain, nausea, vomiting,diarrhea or constipation.   Denies dysuria, frequency, hesitancy or incontinence. Denies joint pain, swelling and limitation in mobility. Denies headaches, seizures, numbness, or tingling. Denies uncontrolled depression, anxiety or insomnia. Denies skin break down or rash.   PE  BP 121/88   Pulse 70   Ht 5\' 2"  (1.575 m)   Wt 212 lb (96.2 kg)   SpO2 98%   BMI 38.78 kg/m   Patient alert and oriented and in no cardiopulmonary distress.  HEENT: No facial asymmetry, EOMI,     Neck supple .  Chest: Clear to auscultation bilaterally.  CVS: S1, S2 no murmurs, no S3.Regular rate.  ABD: Soft non tender.   Ext: No edema  MS: Adequate ROM spine, shoulders, hips and knees.  Skin: Intact, no ulcerations or rash noted.  Psych: Good eye contact, normal affect. Memory intact not anxious or depressed appearing.  CNS: CN 2-12 intact, power,  normal throughout.no focal deficits noted.   Assessment & Plan  ADHD (attention deficit hyperactivity disorder) Start adderall 10 mg daily re eval in 3 weeks  GAD (generalized anxiety disorder) Controlled, no change in medication   Morbid obesity (HCC) Improved, continue current med , no dose increase   Patient re-educated about  the importance  of commitment to a  minimum of 150 minutes of exercise per week as able.  The importance of healthy food choices with portion control discussed, as well as eating regularly and within a 12 hour window most days. The need to choose "clean , green" food 50 to 75% of the time is discussed, as well as to make water the primary drink and set a goal of 64 ounces water daily.       12/07/2021    3:31 PM 09/27/2021    1:05 PM 08/01/2021    9:40 AM  Weight /BMI  Weight 212 lb 220 lb 6.4 oz 239 lb 1.3 oz  Height 5\' 2"  (1.575 m) 5\' 2"  (1.575 m) 5\' 2"  (1.575 m)  BMI 38.78 kg/m2 40.31 kg/m2 43.73 kg/m2      Allergic rhinitis Controlled, no change in medication

## 2021-12-17 NOTE — Assessment & Plan Note (Signed)
Improved, continue current med , no dose increase   Patient re-educated about  the importance of commitment to a  minimum of 150 minutes of exercise per week as able.  The importance of healthy food choices with portion control discussed, as well as eating regularly and within a 12 hour window most days. The need to choose "clean , green" food 50 to 75% of the time is discussed, as well as to make water the primary drink and set a goal of 64 ounces water daily.       12/07/2021    3:31 PM 09/27/2021    1:05 PM 08/01/2021    9:40 AM  Weight /BMI  Weight 212 lb 220 lb 6.4 oz 239 lb 1.3 oz  Height 5\' 2"  (1.575 m) 5\' 2"  (1.575 m) 5\' 2"  (1.575 m)  BMI 38.78 kg/m2 40.31 kg/m2 43.73 kg/m2

## 2022-01-01 ENCOUNTER — Encounter: Payer: Self-pay | Admitting: Family Medicine

## 2022-01-01 ENCOUNTER — Other Ambulatory Visit (HOSPITAL_COMMUNITY): Payer: Self-pay

## 2022-01-01 ENCOUNTER — Ambulatory Visit: Payer: 59 | Admitting: Family Medicine

## 2022-01-01 DIAGNOSIS — F900 Attention-deficit hyperactivity disorder, predominantly inattentive type: Secondary | ICD-10-CM | POA: Diagnosis not present

## 2022-01-01 MED ORDER — AMPHETAMINE-DEXTROAMPHET ER 20 MG PO CP24
20.0000 mg | ORAL_CAPSULE | ORAL | 0 refills | Status: DC
  Filled 2022-01-04: qty 30, 30d supply, fill #0

## 2022-01-01 MED ORDER — AMPHETAMINE-DEXTROAMPHET ER 20 MG PO CP24
20.0000 mg | ORAL_CAPSULE | ORAL | 0 refills | Status: DC
  Filled 2022-02-14: qty 30, 30d supply, fill #0

## 2022-01-01 NOTE — Patient Instructions (Signed)
F/U as before, call if you need me sooner  Increase adderal  dose to 20 mg daily  Continue good weight loss through lifestyle change  Thanks for choosing  [facility], we consider it a privelige to serve you.

## 2022-01-01 NOTE — Assessment & Plan Note (Signed)
Slight improvemt in function noted and verified on screening questionnaire. Tolerating well with no adverse s/e , dose increase and re eval in 6 to 8 weeks in person

## 2022-01-01 NOTE — Progress Notes (Signed)
Virtual Visit via Telephone Note  I connected with [name] on 01/01/22 at  4:40 PM EDT by telephone and verified that I am speaking with the correct person using two identifiers.  Location: Patient: home Provider: office   I discussed the limitations, risks, security and privacy concerns of performing an evaluation and management service by telephone and the availability of in person appointments. I also discussed with the patient that there may be a patient responsible charge related to this service. The patient expressed understanding and agreed to proceed.   History of Present Illness: F/u ADHD. Needs some improvement in ability to complete tasks since taking adderal, denies adverse s/e Takes phentermine on avg once weekly with ongoing weight loss based on change in diet   Observations/Objective: There were no vitals taken for this visit. Good communication with no confusion and intact memory. Alert and oriented x 3 No signs of respiratory distress during speech   Assessment and Plan: ADHD (attention deficit hyperactivity disorder) Slight improvemt in function noted and verified on screening questionnaire. Tolerating well with no adverse s/e , dose increase and re eval in 6 to 8 weeks in person   Follow Up Instructions:    I discussed the assessment and treatment plan with the patient. The patient was provided an opportunity to ask questions and all were answered. The patient agreed with the plan and demonstrated an understanding of the instructions.   The patient was advised to call back or seek an in-person evaluation if the symptoms worsen or if the condition fails to improve as anticipated.  I provided 14 minutes of non-face-to-face time during this encounter.   Tula Nakayama, MD

## 2022-01-04 ENCOUNTER — Other Ambulatory Visit (HOSPITAL_COMMUNITY): Payer: Self-pay

## 2022-02-12 ENCOUNTER — Other Ambulatory Visit (HOSPITAL_COMMUNITY): Payer: Self-pay

## 2022-02-14 ENCOUNTER — Other Ambulatory Visit (HOSPITAL_COMMUNITY): Payer: Self-pay

## 2022-02-21 ENCOUNTER — Ambulatory Visit: Payer: 59 | Admitting: Family Medicine

## 2022-02-21 ENCOUNTER — Encounter: Payer: Self-pay | Admitting: Family Medicine

## 2022-02-21 ENCOUNTER — Other Ambulatory Visit (HOSPITAL_COMMUNITY): Payer: Self-pay

## 2022-02-21 VITALS — BP 122/90 | HR 82 | Ht 62.0 in | Wt 198.0 lb

## 2022-02-21 DIAGNOSIS — F411 Generalized anxiety disorder: Secondary | ICD-10-CM

## 2022-02-21 DIAGNOSIS — J309 Allergic rhinitis, unspecified: Secondary | ICD-10-CM

## 2022-02-21 DIAGNOSIS — E559 Vitamin D deficiency, unspecified: Secondary | ICD-10-CM

## 2022-02-21 DIAGNOSIS — F900 Attention-deficit hyperactivity disorder, predominantly inattentive type: Secondary | ICD-10-CM | POA: Diagnosis not present

## 2022-02-21 DIAGNOSIS — E785 Hyperlipidemia, unspecified: Secondary | ICD-10-CM | POA: Diagnosis not present

## 2022-02-21 MED ORDER — PREDNISONE 5 MG PO TABS
5.0000 mg | ORAL_TABLET | Freq: Two times a day (BID) | ORAL | 0 refills | Status: AC
  Filled 2022-02-21: qty 10, 5d supply, fill #0

## 2022-02-21 NOTE — Patient Instructions (Addendum)
F/U in 4 months, call if you need me sooner   Short course of prednisone 5 days is prescribed for uncontrolled allergy symptoms  Thankful that you are doing better, no med change  Labs already ordered today  It is important that you exercise regularly at least 30 minutes 5 times a week. If you develop chest pain, have severe difficulty breathing, or feel very tired, stop exercising immediately and seek medical attention   Thanks for choosing [facility], we consider it a privelige to serve you.

## 2022-02-22 ENCOUNTER — Encounter: Payer: Self-pay | Admitting: Family Medicine

## 2022-02-22 LAB — CMP14+EGFR
ALT: 22 IU/L (ref 0–32)
AST: 20 IU/L (ref 0–40)
Albumin/Globulin Ratio: 1.8 (ref 1.2–2.2)
Albumin: 4.2 g/dL (ref 3.9–4.9)
Alkaline Phosphatase: 106 IU/L (ref 44–121)
BUN/Creatinine Ratio: 22 (ref 9–23)
BUN: 16 mg/dL (ref 6–20)
Bilirubin Total: 0.2 mg/dL (ref 0.0–1.2)
CO2: 22 mmol/L (ref 20–29)
Calcium: 9.6 mg/dL (ref 8.7–10.2)
Chloride: 104 mmol/L (ref 96–106)
Creatinine, Ser: 0.72 mg/dL (ref 0.57–1.00)
Globulin, Total: 2.4 g/dL (ref 1.5–4.5)
Glucose: 87 mg/dL (ref 70–99)
Potassium: 4.5 mmol/L (ref 3.5–5.2)
Sodium: 140 mmol/L (ref 134–144)
Total Protein: 6.6 g/dL (ref 6.0–8.5)
eGFR: 109 mL/min/{1.73_m2} (ref >59)

## 2022-02-22 LAB — LIPID PANEL
Chol/HDL Ratio: 4.6 ratio — ABNORMAL HIGH (ref 0.0–4.4)
Cholesterol, Total: 180 mg/dL (ref 100–199)
HDL: 39 mg/dL — ABNORMAL LOW (ref >39)
LDL Chol Calc (NIH): 121 mg/dL — ABNORMAL HIGH (ref 0–99)
Triglycerides: 110 mg/dL (ref 0–149)
VLDL Cholesterol Cal: 20 mg/dL (ref 5–40)

## 2022-02-22 LAB — CBC
Hematocrit: 43 % (ref 34.0–46.6)
Hemoglobin: 14.3 g/dL (ref 11.1–15.9)
MCH: 28.7 pg (ref 26.6–33.0)
MCHC: 33.3 g/dL (ref 31.5–35.7)
MCV: 86 fL (ref 79–97)
Platelets: 367 10*3/uL (ref 150–450)
RBC: 4.99 x10E6/uL (ref 3.77–5.28)
RDW: 13.2 % (ref 11.7–15.4)
WBC: 8.2 10*3/uL (ref 3.4–10.8)

## 2022-02-22 LAB — VITAMIN D 25 HYDROXY (VIT D DEFICIENCY, FRACTURES): Vit D, 25-Hydroxy: 80.5 ng/mL (ref 30.0–100.0)

## 2022-02-22 LAB — TSH: TSH: 2.66 u[IU]/mL (ref 0.450–4.500)

## 2022-02-22 NOTE — Assessment & Plan Note (Signed)
Improved and adequately controlled on current dose of BuSpar continue same.

## 2022-02-22 NOTE — Assessment & Plan Note (Signed)
  Patient re-educated about  the importance of commitment to a  minimum of 150 minutes of exercise per week as able.  The importance of healthy food choices with portion control discussed, as well as eating regularly and within a 12 hour window most days. The need to choose "clean , green" food 50 to 75% of the time is discussed, as well as to make water the primary drink and set a goal of 64 ounces water daily.       02/21/2022    9:21 AM 12/07/2021    3:31 PM 09/27/2021    1:05 PM  Weight /BMI  Weight 198 lb 212 lb 220 lb 6.4 oz  Height 5\' 2"  (1.575 m) 5\' 2"  (1.575 m) 5\' 2"  (1.575 m)  BMI 36.21 kg/m2 38.78 kg/m2 40.31 kg/m2

## 2022-02-22 NOTE — Assessment & Plan Note (Signed)
Currently uncontrolled Short course of prednisone 5 mg twice daily to use as needed is prescribed.  She is to continue daily maintenance treatment

## 2022-02-22 NOTE — Assessment & Plan Note (Signed)
corrected

## 2022-02-22 NOTE — Progress Notes (Signed)
   [name]     MRN: [id number]      DOB: [birth year]   HPI Ms. Strider is here for follow up and re-evaluation of chronic medical conditions, medication management and review of any available recent lab and radiology data.  Preventive health is updated, specifically  Cancer screening and Immunization.   Questions or concerns regarding consultations or procedures which the PT has had in the interim are  addressed. The PT denies any adverse reactions to current medications since the last visit.  Notes improved functioning on medication for ADHD, and reports improved control with her anxiety as well.  She has stopped taking phentermine and continues to follow calorie restricted diet with excellent weight loss.  She is exercising on average 2-3 times a week.  She does report increased stress in the family with ill health of both parents however she is getting through this.  ROS Denies recent fever or chills. C/o increased sinus pressure, drainage, watery eyes, denies ear pain or sore throat, cough is non productive, ear pain or sore throat. Denies chest congestion, productive cough or wheezing. Denies chest pains, palpitations and leg swelling Denies abdominal pain, nausea, vomiting,diarrhea or constipation.   Denies dysuria, frequency, hesitancy or incontinence. Denies joint pain, swelling and limitation in mobility. Denies headaches, seizures, numbness, or tingling. Denies depression, uncontrolled  anxiety chronic  insomnia. Denies skin break down or rash.   PE  BP (!) 122/90   Pulse 82   Ht 5\' 2"  (1.575 m)   Wt 198 lb (89.8 kg)   SpO2 97%   Breastfeeding No   BMI 36.21 kg/m   Patient alert and oriented and in no cardiopulmonary distress.  HEENT: No facial asymmetry, EOMI,     Neck supple .No sinus tenderness  Chest: Clear to auscultation bilaterally.  CVS: S1, S2 no murmurs, no S3.Regular rate.  .   Ext: No edema  MS: Adequate ROM spine, shoulders, hips and  knees.  Skin: Intact, no ulcerations or rash noted.  Psych: Good eye contact, normal affect. Memory intact not anxious or depressed appearing.  CNS: CN 2-12 intact, power,  normal throughout.no focal deficits noted.   Assessment & Plan  ADHD (attention deficit hyperactivity disorder) Improved on current dose of Adderall continue same.    GAD (generalized anxiety disorder) Improved and adequately controlled on current dose of BuSpar continue same.  Morbid obesity (HCC)  Patient re-educated about  the importance of commitment to a  minimum of 150 minutes of exercise per week as able.  The importance of healthy food choices with portion control discussed, as well as eating regularly and within a 12 hour window most days. The need to choose "clean , green" food 50 to 75% of the time is discussed, as well as to make water the primary drink and set a goal of 64 ounces water daily.       02/21/2022    9:21 AM 12/07/2021    3:31 PM 09/27/2021    1:05 PM  Weight /BMI  Weight 198 lb 212 lb 220 lb 6.4 oz  Height 5\' 2"  (1.575 m) 5\' 2"  (1.575 m) 5\' 2"  (1.575 m)  BMI 36.21 kg/m2 38.78 kg/m2 40.31 kg/m2      Vitamin D deficiency corrected  Allergic rhinitis Currently uncontrolled Short course of prednisone 5 mg twice daily to use as needed is prescribed.  She is to continue daily maintenance treatment

## 2022-02-22 NOTE — Assessment & Plan Note (Signed)
Improved on current dose of Adderall continue same.

## 2022-03-28 ENCOUNTER — Other Ambulatory Visit (HOSPITAL_COMMUNITY): Payer: Self-pay

## 2022-03-28 ENCOUNTER — Other Ambulatory Visit: Payer: Self-pay | Admitting: Family Medicine

## 2022-03-28 MED ORDER — AMPHETAMINE-DEXTROAMPHET ER 20 MG PO CP24
20.0000 mg | ORAL_CAPSULE | ORAL | 0 refills | Status: DC
  Filled 2022-03-28: qty 30, 30d supply, fill #0

## 2022-03-28 NOTE — Telephone Encounter (Signed)
Please send electronically if you would like  

## 2022-03-29 ENCOUNTER — Other Ambulatory Visit: Payer: Self-pay

## 2022-03-29 ENCOUNTER — Other Ambulatory Visit (HOSPITAL_COMMUNITY): Payer: Self-pay

## 2022-04-05 ENCOUNTER — Encounter: Payer: Self-pay | Admitting: Family Medicine

## 2022-04-05 MED ORDER — OSELTAMIVIR PHOSPHATE 75 MG PO CAPS: 75.0000 mg | ORAL_CAPSULE | Freq: Two times a day (BID) | ORAL | 0 refills | Status: DC

## 2022-04-27 ENCOUNTER — Other Ambulatory Visit: Payer: Self-pay | Admitting: Family Medicine

## 2022-04-29 ENCOUNTER — Other Ambulatory Visit (HOSPITAL_COMMUNITY): Payer: Self-pay

## 2022-04-29 MED ORDER — AMPHETAMINE-DEXTROAMPHET ER 20 MG PO CP24
20.0000 mg | ORAL_CAPSULE | ORAL | 0 refills | Status: DC
  Filled 2022-04-29: qty 30, 30d supply, fill #0

## 2022-04-29 MED ORDER — AMPHETAMINE-DEXTROAMPHET ER 20 MG PO CP24
20.0000 mg | ORAL_CAPSULE | ORAL | 0 refills | Status: DC
  Filled 2022-05-29: qty 30, 30d supply, fill #0

## 2022-04-29 MED ORDER — AMPHETAMINE-DEXTROAMPHET ER 20 MG PO CP24
20.0000 mg | ORAL_CAPSULE | ORAL | 0 refills | Status: DC
  Filled 2022-07-19: qty 30, 30d supply, fill #0

## 2022-05-29 ENCOUNTER — Other Ambulatory Visit (HOSPITAL_COMMUNITY): Payer: Self-pay

## 2022-06-01 ENCOUNTER — Telehealth: Payer: Self-pay | Admitting: Family Medicine

## 2022-06-01 DIAGNOSIS — K047 Periapical abscess without sinus: Secondary | ICD-10-CM

## 2022-06-01 MED ORDER — PENICILLIN V POTASSIUM 500 MG PO TABS: 500.0000 mg | ORAL_TABLET | Freq: Three times a day (TID) | ORAL | 0 refills | Status: AC

## 2022-06-01 NOTE — Progress Notes (Signed)

## 2022-06-03 ENCOUNTER — Telehealth: Payer: Commercial Managed Care - PPO | Admitting: Physician Assistant

## 2022-06-03 DIAGNOSIS — B379 Candidiasis, unspecified: Secondary | ICD-10-CM

## 2022-06-03 MED ORDER — FLUCONAZOLE 150 MG PO TABS: ORAL_TABLET | ORAL | 0 refills | Status: DC

## 2022-06-03 NOTE — Progress Notes (Signed)
I have spent 5 minutes in review of e-visit questionnaire, review and updating patient chart, medical decision making and response to patient.   Asyah Candler Cody Griffith Santilli, PA-C    

## 2022-06-03 NOTE — Progress Notes (Signed)

## 2022-06-26 ENCOUNTER — Encounter: Payer: Self-pay | Admitting: Family Medicine

## 2022-06-26 ENCOUNTER — Ambulatory Visit: Payer: Commercial Managed Care - PPO | Admitting: Family Medicine

## 2022-06-26 VITALS — BP 126/92 | HR 87 | Ht 62.0 in | Wt 187.0 lb

## 2022-06-26 DIAGNOSIS — F411 Generalized anxiety disorder: Secondary | ICD-10-CM

## 2022-06-26 DIAGNOSIS — F900 Attention-deficit hyperactivity disorder, predominantly inattentive type: Secondary | ICD-10-CM

## 2022-06-26 NOTE — Patient Instructions (Addendum)
F/U in 4 months, call if you need me sooner  Please schedule mammogram , HAPPY 42!  NEED pap     CONGRATS  It is important that you exercise regularly at least 30 minutes 5 times a week. If you develop chest pain, have severe difficulty breathing, or feel very tired, stop exercising immediately and seek medical attention    Weight loss goal of 10 pounds or more  Thanks for choosing [facility], we consider it a privelige to serve you.

## 2022-07-01 ENCOUNTER — Other Ambulatory Visit (HOSPITAL_COMMUNITY): Payer: Self-pay

## 2022-07-01 ENCOUNTER — Encounter: Payer: Self-pay | Admitting: Family Medicine

## 2022-07-01 MED ORDER — AMPHETAMINE-DEXTROAMPHET ER 20 MG PO CP24
20.0000 mg | ORAL_CAPSULE | ORAL | 0 refills | Status: DC
  Filled 2022-08-23: qty 30, 30d supply, fill #0

## 2022-07-01 MED ORDER — AMPHETAMINE-DEXTROAMPHET ER 20 MG PO CP24
20.0000 mg | ORAL_CAPSULE | ORAL | 0 refills | Status: DC
  Filled 2022-10-04: qty 30, 30d supply, fill #0

## 2022-07-01 MED ORDER — AMPHETAMINE-DEXTROAMPHET ER 20 MG PO CP24
20.0000 mg | ORAL_CAPSULE | ORAL | 0 refills | Status: DC
  Filled 2022-11-08: qty 30, 30d supply, fill #0

## 2022-07-01 NOTE — Assessment & Plan Note (Signed)
Controlled, no change in medication  

## 2022-07-01 NOTE — Progress Notes (Signed)
   [name]     MRN: [id number]      DOB: 10-03-1980   HPI [name] is here for follow up and re-evaluation of chronic medical conditions, medication management and review of any available recent lab and radiology data.  Preventive health is updated, specifically  Cancer screening and Immunization.   Questions or concerns regarding consultations or procedures which the PT has had in the interim are  addressed. The PT denies any adverse reactions to current medications since the last visit.  Increased stress in past 2 months, Dad acutely ill and slow recovery, pay cut on current schedule aDHD med working very well. Plans to increase exercise, has diet controlled    ROS Denies recent fever or chills. Denies sinus pressure, nasal congestion, ear pain or sore throat. Denies chest congestion, productive cough or wheezing. Denies chest pains, palpitations and leg swelling Denies abdominal pain, nausea, vomiting,diarrhea or constipation.   Denies dysuria, frequency, hesitancy or incontinence. Denies joint pain, swelling and limitation in mobility. Denies headaches, seizures, numbness, or tingling. Denies skin break down or rash.   PE  BP (!) 126/92 (BP Location: Right Arm, Patient Position: Sitting, Cuff Size: Large)   Pulse 87   Ht 5\' 2"  (1.575 m)   Wt 187 lb (84.8 kg)   SpO2 99%   BMI 34.20 kg/m   Patient alert and oriented and in no cardiopulmonary distress.  HEENT: No facial asymmetry, EOMI,     Neck supple .  Chest: Clear to auscultation bilaterally.  CVS: S1, S2 no murmurs, no S3.Regular rate.  ABD: Soft non tender.   Ext: No edema  MS: Adequate ROM spine, shoulders, hips and knees.  Skin: Intact, no ulcerations or rash noted.  Psych: Good eye contact, normal affect. Memory intact not anxious or depressed appearing.  CNS: CN 2-12 intact, power,  normal throughout.no focal deficits noted.   Assessment & Plan  GAD (generalized anxiety  disorder) Controlled, no change in medication   Morbid obesity ([place])  Patient re-educated about  the importance of commitment to a  minimum of 150 minutes of exercise per week as able.  The importance of healthy food choices with portion control discussed, as well as eating regularly and within a 12 hour window most days. The need to choose "clean , green" food 50 to 75% of the time is discussed, as well as to make water the primary drink and set a goal of 64 ounces water daily.       06/26/2022    9:37 AM 02/21/2022    9:21 AM 12/07/2021    3:31 PM  Weight /BMI  Weight 187 lb 198 lb 212 lb  Height 5\' 2"  (1.575 m) 5\' 2"  (1.575 m) 5\' 2"  (1.575 m)  BMI 34.2 kg/m2 36.21 kg/m2 38.78 kg/m2    Excellent weight loss with dietary change, continue same and increase exercise  ADHD (attention deficit hyperactivity disorder) Controlled, no change in medication

## 2022-07-01 NOTE — Assessment & Plan Note (Signed)
  Patient re-educated about  the importance of commitment to a  minimum of 150 minutes of exercise per week as able.  The importance of healthy food choices with portion control discussed, as well as eating regularly and within a 12 hour window most days. The need to choose "clean , green" food 50 to 75% of the time is discussed, as well as to make water the primary drink and set a goal of 64 ounces water daily.       06/26/2022    9:37 AM 02/21/2022    9:21 AM 12/07/2021    3:31 PM  Weight /BMI  Weight 187 lb 198 lb 212 lb  Height 5\' 2"  (1.575 m) 5\' 2"  (1.575 m) 5\' 2"  (1.575 m)  BMI 34.2 kg/m2 36.21 kg/m2 38.78 kg/m2    Excellent weight loss with dietary change, continue same and increase exercise

## 2022-07-19 ENCOUNTER — Other Ambulatory Visit (HOSPITAL_COMMUNITY): Payer: Self-pay

## 2022-08-23 ENCOUNTER — Other Ambulatory Visit (HOSPITAL_COMMUNITY): Payer: Self-pay

## 2022-09-19 ENCOUNTER — Other Ambulatory Visit: Payer: Self-pay | Admitting: Family Medicine

## 2022-09-20 ENCOUNTER — Other Ambulatory Visit (HOSPITAL_COMMUNITY): Payer: Self-pay

## 2022-09-20 MED ORDER — BUSPIRONE HCL 10 MG PO TABS
10.0000 mg | ORAL_TABLET | Freq: Two times a day (BID) | ORAL | 3 refills | Status: DC
  Filled 2022-09-20: qty 180, 90d supply, fill #0
  Filled 2022-12-19: qty 180, 90d supply, fill #1
  Filled 2023-04-01: qty 180, 90d supply, fill #2

## 2022-10-04 ENCOUNTER — Other Ambulatory Visit (HOSPITAL_COMMUNITY): Payer: Self-pay

## 2022-10-07 ENCOUNTER — Other Ambulatory Visit (HOSPITAL_COMMUNITY): Payer: Self-pay

## 2022-10-30 ENCOUNTER — Ambulatory Visit: Payer: Commercial Managed Care - PPO | Admitting: Family Medicine

## 2022-11-08 ENCOUNTER — Other Ambulatory Visit (HOSPITAL_COMMUNITY): Payer: Self-pay

## 2022-12-11 ENCOUNTER — Other Ambulatory Visit: Payer: Self-pay | Admitting: Family Medicine

## 2022-12-11 ENCOUNTER — Other Ambulatory Visit (HOSPITAL_COMMUNITY): Payer: Self-pay

## 2022-12-11 MED ORDER — AMPHETAMINE-DEXTROAMPHET ER 20 MG PO CP24: 20.0000 mg | ORAL_CAPSULE | Freq: Every day | ORAL | 0 refills | Status: DC

## 2022-12-11 MED ORDER — AMPHETAMINE-DEXTROAMPHET ER 20 MG PO CP24
20.0000 mg | ORAL_CAPSULE | ORAL | 0 refills | Status: DC
  Filled 2022-12-11: qty 30, 30d supply, fill #0

## 2022-12-17 ENCOUNTER — Ambulatory Visit: Payer: Commercial Managed Care - PPO | Admitting: Family Medicine

## 2023-01-14 ENCOUNTER — Other Ambulatory Visit (HOSPITAL_COMMUNITY): Payer: Self-pay

## 2023-01-14 ENCOUNTER — Other Ambulatory Visit: Payer: Self-pay | Admitting: Family Medicine

## 2023-01-14 MED ORDER — AMPHETAMINE-DEXTROAMPHET ER 20 MG PO CP24
20.0000 mg | ORAL_CAPSULE | ORAL | 0 refills | Status: DC
  Filled 2023-01-14: qty 30, 30d supply, fill #0

## 2023-02-01 ENCOUNTER — Telehealth: Payer: Commercial Managed Care - PPO | Admitting: Family Medicine

## 2023-02-01 DIAGNOSIS — B029 Zoster without complications: Secondary | ICD-10-CM

## 2023-02-01 MED ORDER — VALACYCLOVIR HCL 1 G PO TABS: 1000.0000 mg | ORAL_TABLET | Freq: Three times a day (TID) | ORAL | 0 refills | Status: AC

## 2023-02-01 NOTE — Progress Notes (Signed)
E visit for Shingles   We are sorry that you are not feeling well. Here is how we plan to help!   Based on what you shared with me it looks like you have shingles.  Shingles or herpes zoster, is a common infection of the nerves.  It is a painful rash caused by the herpes zoster virus.  This is the same virus that causes chickenpox.  After a person has chickenpox, the virus remains inactive in the nerve cells.  Years later, the virus can become active again and travel to the skin.  It typically will appear on one side of the face or body.  Burning or shooting pain, tingling, or itching are early signs of the infection.     Blisters typically scab over in 7 to 10 days and clear up within 2-4 weeks. Shingles is only contagious to people that have never had the chickenpox, the chickenpox vaccine, or anyone who has a compromised immune system.  You should avoid contact with these type of people until your blisters scab over.   I have prescribed Valacyclovir 1g three times daily for 7 days  HOME CARE:   Apply ice packs (wrapped in a thin towel), cool compresses, or soak in cool bath to help reduce pain.   Use calamine lotion to calm itchy skin.   Avoid scratching the rash.   Avoid direct sunlight.   GET HELP RIGHT AWAY IF:   Symptoms that don't away after treatment.   A rash or blisters near your eye.   Increased drainage, fever, or rash after treatment.   Severe pain that doesn't go away.   ?   ?   ?   MAKE SURE YOU    ?   Understand these instructions.   Will watch your condition.   Will get help right away if you are not doing well or get worse.   ?   ?   Thank you for choosing an e-visit.   Your e-visit answers were reviewed by a board certified advanced clinical practitioner to complete your personal care plan. Depending upon the condition, your plan could have included both over the counter or prescription medications.   Please review your pharmacy choice. Make  sure the pharmacy is open so you can pick up prescription now. If there is a problem, you may contact your provider through [employer] and have the prescription routed to another pharmacy.   Your safety is important to [place]. If you have drug allergies check your prescription carefully.    For the next 24 hours you can use MyChart to ask questions about today's visit, request a non-urgent call back, or ask for a work or school excuse.   You will get an email in the next two days asking about your experience. I hope that your e-visit has been valuable and will speed your recovery   I have spent 5 minutes in review of e-visit questionnaire, review and updating patient chart, medical decision making and response to patient.   Reed Pandy, PA-C

## 2023-02-07 ENCOUNTER — Ambulatory Visit: Payer: Commercial Managed Care - PPO | Admitting: Family Medicine

## 2023-02-07 ENCOUNTER — Other Ambulatory Visit (HOSPITAL_COMMUNITY): Payer: Self-pay

## 2023-02-07 VITALS — BP 124/87 | HR 84 | Ht 62.0 in | Wt 184.1 lb

## 2023-02-07 DIAGNOSIS — E66811 Obesity, class 1: Secondary | ICD-10-CM

## 2023-02-07 DIAGNOSIS — E559 Vitamin D deficiency, unspecified: Secondary | ICD-10-CM

## 2023-02-07 DIAGNOSIS — J309 Allergic rhinitis, unspecified: Secondary | ICD-10-CM

## 2023-02-07 DIAGNOSIS — F411 Generalized anxiety disorder: Secondary | ICD-10-CM | POA: Diagnosis not present

## 2023-02-07 DIAGNOSIS — F900 Attention-deficit hyperactivity disorder, predominantly inattentive type: Secondary | ICD-10-CM | POA: Diagnosis not present

## 2023-02-07 DIAGNOSIS — I499 Cardiac arrhythmia, unspecified: Secondary | ICD-10-CM | POA: Diagnosis not present

## 2023-02-07 DIAGNOSIS — E785 Hyperlipidemia, unspecified: Secondary | ICD-10-CM | POA: Diagnosis not present

## 2023-02-07 MED ORDER — AMPHETAMINE-DEXTROAMPHET ER 20 MG PO CP24
20.0000 mg | ORAL_CAPSULE | ORAL | 0 refills | Status: DC
  Filled 2023-02-14: qty 90, 90d supply, fill #0

## 2023-02-07 NOTE — Patient Instructions (Addendum)
F/u in 5 months, call if you need me sooner  Keep up the great work !  Call for therapy if needed   Fasting CBC, lipid, cmp and EGFr, tSH and  Vit D in next 1 week,   No med changes  Thanks for choosing [facility], we consider it a privelige to serve you.

## 2023-02-09 NOTE — Assessment & Plan Note (Signed)
Controlled, no change in medication  

## 2023-02-09 NOTE — Assessment & Plan Note (Signed)
Updated lab needed at/ before next visit.   

## 2023-02-09 NOTE — Assessment & Plan Note (Signed)
Hyperlipidemia:Low fat diet discussed and encouraged.   Lipid Panel  Lab Results  Component Value Date   CHOL 180 02/21/2022   HDL 39 (L) 02/21/2022   LDLCALC 121 (H) 02/21/2022   TRIG 110 02/21/2022   CHOLHDL 4.6 (H) 02/21/2022     Updated lab needed at/ before next visit.

## 2023-02-09 NOTE — Assessment & Plan Note (Signed)
Very infrequent need for metoprolol, has not used in past  3 months

## 2023-02-09 NOTE — Progress Notes (Signed)
[name]     MRN: [id number]      DOB: [birth year]  Chief Complaint  Patient presents with   Follow-up    HPI [name] is here for follow up and re-evaluation of chronic medical conditions, medication management and review of any available recent lab and radiology data.  Preventive health is updated, specifically  Cancer screening and Immunization.   Recently treated for shingles on left forehead and cheek, eye not involved, improved with valtrex. The PT denies any adverse reactions to current medications since the last visit.  Last 1 month has been extremely stressful, not able to controlling diet, will get help from Employee assistance if deemed necessarys   ROS Denies recent fever or chills. Denies sinus pressure, nasal congestion, ear pain or sore throat. Denies chest congestion, productive cough or wheezing. Denies chest pains, palpitations and leg swelling Denies abdominal pain, nausea, vomiting,diarrhea or constipation.   Denies dysuria, frequency, hesitancy or incontinence. Denies joint pain, swelling and limitation in mobility. Denies headaches, seizures, numbness, or tingling. PE  BP 124/87 (BP Location: Right Arm, Patient Position: Sitting, Cuff Size: Large)   Pulse 84   Ht 5\' 2"  (1.575 m)   Wt 184 lb 1.3 oz (83.5 kg)   SpO2 98%   BMI 33.67 kg/m   Patient alert and oriented and in no cardiopulmonary distress.  HEENT: No facial asymmetry, EOMI,     Neck supple .  Chest: Clear to auscultation bilaterally.  CVS: S1, S2 no murmurs, no S3.Regular rate.  ABD: Soft non tender.   Ext: No edema  MS: Adequate ROM spine, shoulders, hips and knees.  Skin: Intact, no ulcerations or rash noted.  Psych: Good eye contact, normal affect. Memory intact not anxious or depressed appearing.  CNS: CN 2-12 intact, power,  normal throughout.no focal deficits noted.   Assessment & Plan  ADHD (attention deficit hyperactivity disorder) Controlled, no change in  medication   GAD (generalized anxiety disorder) Controlled, no change in medication   Obesity (BMI 30.0-34.9)  Patient re-educated about  the importance of commitment to a  minimum of 150 minutes of exercise per week as able.  The importance of healthy food choices with portion control discussed, as well as eating regularly and within a 12 hour window most days. The need to choose "clean , green" food 50 to 75% of the time is discussed, as well as to make water the primary drink and set a goal of 64 ounces water daily.       02/07/2023    1:38 PM 06/26/2022    9:37 AM 02/21/2022    9:21 AM  Weight /BMI  Weight 184 lb 1.3 oz 187 lb 198 lb  Height 5\' 2"  (1.575 m) 5\' 2"  (1.575 m) 5\' 2"  (1.575 m)  BMI 33.67 kg/m2 34.2 kg/m2 36.21 kg/m2      Irregular heart rate Very infrequent need for metoprolol, has not used in past  3 months  Allergic rhinitis Controlled, no change in medication   Hyperlipidemia Hyperlipidemia:Low fat diet discussed and encouraged.   Lipid Panel  Lab Results  Component Value Date   CHOL 180 02/21/2022   HDL 39 (L) 02/21/2022   LDLCALC 121 (H) 02/21/2022   TRIG 110 02/21/2022   CHOLHDL 4.6 (H) 02/21/2022     Updated lab needed at/ before next visit.   Vitamin D deficiency Updated lab needed at/ before next visit.

## 2023-02-09 NOTE — Assessment & Plan Note (Signed)
  Patient re-educated about  the importance of commitment to a  minimum of 150 minutes of exercise per week as able.  The importance of healthy food choices with portion control discussed, as well as eating regularly and within a 12 hour window most days. The need to choose "clean , green" food 50 to 75% of the time is discussed, as well as to make water the primary drink and set a goal of 64 ounces water daily.       02/07/2023    1:38 PM 06/26/2022    9:37 AM 02/21/2022    9:21 AM  Weight /BMI  Weight 184 lb 1.3 oz 187 lb 198 lb  Height 5\' 2"  (1.575 m) 5\' 2"  (1.575 m) 5\' 2"  (1.575 m)  BMI 33.67 kg/m2 34.2 kg/m2 36.21 kg/m2

## 2023-02-10 ENCOUNTER — Other Ambulatory Visit (HOSPITAL_COMMUNITY): Payer: Self-pay | Admitting: Family Medicine

## 2023-02-10 DIAGNOSIS — Z1231 Encounter for screening mammogram for malignant neoplasm of breast: Secondary | ICD-10-CM

## 2023-02-14 ENCOUNTER — Ambulatory Visit (HOSPITAL_COMMUNITY)
Admission: RE | Admit: 2023-02-14 | Discharge: 2023-02-14 | Disposition: A | Discharge status: Home / Self Care | Payer: Commercial Managed Care - PPO | Source: Ambulatory Visit | Attending: Family Medicine | Admitting: Family Medicine

## 2023-02-14 ENCOUNTER — Encounter (HOSPITAL_COMMUNITY): Payer: Self-pay

## 2023-02-14 ENCOUNTER — Inpatient Hospital Stay (HOSPITAL_COMMUNITY): Admission: RE | Admit: 2023-02-14 | Payer: Commercial Managed Care - PPO | Source: Ambulatory Visit

## 2023-02-14 ENCOUNTER — Other Ambulatory Visit (HOSPITAL_COMMUNITY): Payer: Self-pay

## 2023-02-14 DIAGNOSIS — E785 Hyperlipidemia, unspecified: Secondary | ICD-10-CM | POA: Diagnosis not present

## 2023-02-14 DIAGNOSIS — Z1231 Encounter for screening mammogram for malignant neoplasm of breast: Secondary | ICD-10-CM

## 2023-02-14 DIAGNOSIS — E559 Vitamin D deficiency, unspecified: Secondary | ICD-10-CM | POA: Diagnosis not present

## 2023-02-15 LAB — CBC
Hematocrit: 45.6 % (ref 34.0–46.6)
Hemoglobin: 14.5 g/dL (ref 11.1–15.9)
MCH: 29.4 pg (ref 26.6–33.0)
MCHC: 31.8 g/dL (ref 31.5–35.7)
MCV: 93 fL (ref 79–97)
Platelets: 340 10*3/uL (ref 150–450)
RBC: 4.93 x10E6/uL (ref 3.77–5.28)
RDW: 13.2 % (ref 11.7–15.4)
WBC: 8.3 10*3/uL (ref 3.4–10.8)

## 2023-02-15 LAB — CMP14+EGFR
ALT: 13 [IU]/L (ref 0–32)
AST: 18 [IU]/L (ref 0–40)
Albumin: 4 g/dL (ref 3.9–4.9)
Alkaline Phosphatase: 91 [IU]/L (ref 44–121)
BUN/Creatinine Ratio: 26 — ABNORMAL HIGH (ref 9–23)
BUN: 17 mg/dL (ref 6–24)
Bilirubin Total: <0.2 mg/dL (ref 0.0–1.2)
CO2: 24 mmol/L (ref 20–29)
Calcium: 9.5 mg/dL (ref 8.7–10.2)
Chloride: 104 mmol/L (ref 96–106)
Creatinine, Ser: 0.66 mg/dL (ref 0.57–1.00)
Globulin, Total: 2.5 g/dL (ref 1.5–4.5)
Glucose: 84 mg/dL (ref 70–99)
Potassium: 4 mmol/L (ref 3.5–5.2)
Sodium: 141 mmol/L (ref 134–144)
Total Protein: 6.5 g/dL (ref 6.0–8.5)
eGFR: 114 mL/min/{1.73_m2} (ref >59)

## 2023-02-15 LAB — LIPID PANEL
Chol/HDL Ratio: 3.4 ratio (ref 0.0–4.4)
Cholesterol, Total: 168 mg/dL (ref 100–199)
HDL: 50 mg/dL (ref >39)
LDL Chol Calc (NIH): 101 mg/dL — ABNORMAL HIGH (ref 0–99)
Triglycerides: 91 mg/dL (ref 0–149)
VLDL Cholesterol Cal: 17 mg/dL (ref 5–40)

## 2023-02-15 LAB — VITAMIN D 25 HYDROXY (VIT D DEFICIENCY, FRACTURES): Vit D, 25-Hydroxy: 61 ng/mL (ref 30.0–100.0)

## 2023-02-15 LAB — TSH: TSH: 3.18 u[IU]/mL (ref 0.450–4.500)

## 2023-02-17 ENCOUNTER — Other Ambulatory Visit (HOSPITAL_COMMUNITY): Payer: Self-pay

## 2023-02-18 ENCOUNTER — Other Ambulatory Visit (HOSPITAL_COMMUNITY): Payer: Self-pay | Admitting: Family Medicine

## 2023-02-18 DIAGNOSIS — R928 Other abnormal and inconclusive findings on diagnostic imaging of breast: Secondary | ICD-10-CM

## 2023-03-25 ENCOUNTER — Ambulatory Visit (HOSPITAL_COMMUNITY)
Admission: RE | Admit: 2023-03-25 | Discharge: 2023-03-25 | Disposition: A | Discharge status: Home / Self Care | Payer: Commercial Managed Care - PPO | Source: Ambulatory Visit | Attending: Family Medicine | Admitting: Family Medicine

## 2023-03-25 ENCOUNTER — Encounter (HOSPITAL_COMMUNITY): Payer: Self-pay

## 2023-03-25 ENCOUNTER — Ambulatory Visit: Payer: Commercial Managed Care - PPO | Admitting: Adult Health

## 2023-03-25 DIAGNOSIS — R928 Other abnormal and inconclusive findings on diagnostic imaging of breast: Secondary | ICD-10-CM | POA: Insufficient documentation

## 2023-03-25 DIAGNOSIS — R92322 Mammographic fibroglandular density, left breast: Secondary | ICD-10-CM | POA: Diagnosis not present

## 2023-04-30 ENCOUNTER — Ambulatory Visit: Payer: Commercial Managed Care - PPO | Admitting: Adult Health

## 2023-04-30 ENCOUNTER — Encounter: Payer: Self-pay | Admitting: Adult Health

## 2023-04-30 ENCOUNTER — Other Ambulatory Visit (HOSPITAL_COMMUNITY)
Admission: RE | Admit: 2023-04-30 | Discharge: 2023-04-30 | Disposition: A | Discharge status: Home / Self Care | Payer: Commercial Managed Care - PPO | Source: Ambulatory Visit | Attending: Adult Health | Admitting: Adult Health

## 2023-04-30 VITALS — BP 138/90 | HR 87 | Ht 63.0 in | Wt 189.5 lb

## 2023-04-30 DIAGNOSIS — Z01419 Encounter for gynecological examination (general) (routine) without abnormal findings: Secondary | ICD-10-CM

## 2023-04-30 DIAGNOSIS — Z1331 Encounter for screening for depression: Secondary | ICD-10-CM

## 2023-04-30 DIAGNOSIS — Z1211 Encounter for screening for malignant neoplasm of colon: Secondary | ICD-10-CM | POA: Diagnosis not present

## 2023-04-30 DIAGNOSIS — R030 Elevated blood-pressure reading, without diagnosis of hypertension: Secondary | ICD-10-CM | POA: Insufficient documentation

## 2023-04-30 DIAGNOSIS — Z975 Presence of (intrauterine) contraceptive device: Secondary | ICD-10-CM | POA: Diagnosis not present

## 2023-04-30 LAB — HEMOCCULT GUIAC POC 1CARD (OFFICE): Fecal Occult Blood, POC: NEGATIVE

## 2023-04-30 NOTE — Progress Notes (Signed)
 Patient ID: [name], female   DOB: [birth year], 43 y.o.   MRN: [id number] History of Present Illness: [name] is a 43 year old white female, married, [name] in for a well woman gyn exam  and pap. She has lost about 60 lbs in the past. She is RN in NICU.  PCP is Dr Rodolph Clap   Current Medications, Allergies, Past Medical History, Past Surgical History, Family History and Social History were reviewed in [employer] electronic medical record.     Review of Systems: Patient denies any headaches, hearing loss, fatigue, blurred vision, shortness of breath, chest pain, abdominal pain, problems with bowel movements, urination, or intercourse. No joint pain or mood swings.     Physical Exam:BP (!) 138/90 (BP Location: Left Arm, Patient Position: Sitting, Cuff Size: Normal)   Pulse 87   Ht 5\' 3"  (1.6 m)   Wt 189 lb 8 oz (86 kg)   BMI 33.57 kg/m   General:  Well developed, well nourished, no acute distress Skin:  Warm and dry Neck:  Midline trachea, normal thyroid , good ROM, no lymphadenopathy Lungs; Clear to auscultation bilaterally Breast:  No dominant palpable mass, retraction, or nipple discharge Cardiovascular: Regular rate and rhythm Abdomen:  Soft, non tender, no hepatosplenomegaly Pelvic:  External genitalia is normal in appearance, no lesions.  The vagina is normal in appearance. Urethra has no lesions or masses. The cervix is bulbous. +IUD strings at os, pap with HR HPV genotyping performed. Uterus is felt to be normal size, shape, and contour.  No adnexal masses or tenderness noted.Bladder is non tender, no masses felt. Rectal: Good sphincter tone, no polyps, or hemorrhoids felt.  Hemoccult negative. Extremities/musculoskeletal:  No swelling or varicosities noted, no clubbing or cyanosis Psych:  No mood changes, alert and cooperative,seems happy AA us  1 Fall risk is low    04/30/2023    1:33 PM 02/07/2023    1:40 PM 02/07/2023    1:39 PM  Depression screen PHQ 2/9   Decreased Interest 0 0 0  Down, Depressed, Hopeless 0 0 0  PHQ - 2 Score 0 0 0  Altered sleeping 2 3   Tired, decreased energy 2 2   Change in appetite 0 0   Feeling bad or failure about yourself  0 0   Trouble concentrating 0 1   Moving slowly or fidgety/restless 0 0   Suicidal thoughts 0 0   PHQ-9 Score 4 6   Difficult doing work/chores  Not difficult at all        04/30/2023    1:33 PM 02/07/2023    1:40 PM 06/26/2022    9:38 AM 02/21/2022    9:43 AM  GAD 7 : Generalized Anxiety Score  Nervous, Anxious, on Edge 1 1 0 1  Control/stop worrying 0 0 0 0  Worry too much - different things 0 0 1 1  Trouble relaxing 1 2 1 1   Restless 0 0 0 0  Easily annoyed or irritable 0 1 0 1  Afraid - awful might happen 0 0 1   Total GAD 7 Score 2 4 3    Anxiety Difficulty  Not difficult at all Not difficult at all Not difficult at all    Upstream - 04/30/23 1340       Pregnancy Intention Screening   Does the patient want to become pregnant in the next year? No    Does the patient's partner want to become pregnant in the next year? No    Would  the patient like to discuss contraceptive options today? No      Contraception Wrap Up   Current Method IUD or IUS    End Method IUD or IUS    Contraception Counseling Provided Yes              Examination chaperoned by Alphonso Aschoff LPN   Impression and plan: 1. Encounter for gynecological examination with Papanicolaou smear of cervix (Primary) Pap sent Pap in 3 years if normal Physical and labs with PCP Had mammogram 02/14/23 and had to go back 04/04/23 in follow up and all negative  - Cytology - PAP( [place])  2. Encounter for screening fecal occult blood testing Hemoccult was negative   3. IUD (intrauterine device) in place Liletta  placed 07/04/17 return for removal in 2 years  4. Elevated BP without diagnosis of hypertension Keep check on BP and follow up with PCP

## 2023-05-05 LAB — CYTOLOGY - PAP
Comment: NEGATIVE
Diagnosis: NEGATIVE
High risk HPV: NEGATIVE

## 2023-05-20 ENCOUNTER — Encounter: Payer: Self-pay | Admitting: Family Medicine

## 2023-05-20 MED ORDER — OSELTAMIVIR PHOSPHATE 75 MG PO CAPS: 75.0000 mg | ORAL_CAPSULE | Freq: Two times a day (BID) | ORAL | 0 refills | Status: DC

## 2023-05-21 ENCOUNTER — Other Ambulatory Visit: Payer: Self-pay | Admitting: Family Medicine

## 2023-05-29 ENCOUNTER — Other Ambulatory Visit (HOSPITAL_COMMUNITY): Payer: Self-pay

## 2023-06-06 ENCOUNTER — Other Ambulatory Visit (HOSPITAL_COMMUNITY): Payer: Self-pay

## 2023-06-06 ENCOUNTER — Encounter (HOSPITAL_COMMUNITY): Payer: Self-pay

## 2023-06-06 ENCOUNTER — Other Ambulatory Visit: Payer: Self-pay | Admitting: Family Medicine

## 2023-06-10 ENCOUNTER — Encounter: Payer: Self-pay | Admitting: Family Medicine

## 2023-06-10 MED ORDER — AMPHETAMINE-DEXTROAMPHET ER 20 MG PO CP24
20.0000 mg | ORAL_CAPSULE | ORAL | 0 refills | Status: DC
  Filled 2023-06-10: qty 90, 90d supply, fill #0

## 2023-06-11 ENCOUNTER — Other Ambulatory Visit (HOSPITAL_COMMUNITY): Payer: Self-pay

## 2023-06-12 ENCOUNTER — Other Ambulatory Visit (HOSPITAL_COMMUNITY): Payer: Self-pay

## 2023-07-08 ENCOUNTER — Ambulatory Visit: Payer: Commercial Managed Care - PPO | Admitting: Family Medicine

## 2023-07-08 ENCOUNTER — Encounter: Payer: Self-pay | Admitting: Family Medicine

## 2023-07-08 ENCOUNTER — Other Ambulatory Visit (HOSPITAL_COMMUNITY): Payer: Self-pay

## 2023-07-08 VITALS — BP 134/83 | HR 84 | Ht 63.0 in | Wt 191.1 lb

## 2023-07-08 DIAGNOSIS — J309 Allergic rhinitis, unspecified: Secondary | ICD-10-CM

## 2023-07-08 DIAGNOSIS — E559 Vitamin D deficiency, unspecified: Secondary | ICD-10-CM | POA: Diagnosis not present

## 2023-07-08 DIAGNOSIS — R030 Elevated blood-pressure reading, without diagnosis of hypertension: Secondary | ICD-10-CM

## 2023-07-08 DIAGNOSIS — F5104 Psychophysiologic insomnia: Secondary | ICD-10-CM

## 2023-07-08 DIAGNOSIS — E785 Hyperlipidemia, unspecified: Secondary | ICD-10-CM

## 2023-07-08 DIAGNOSIS — F411 Generalized anxiety disorder: Secondary | ICD-10-CM

## 2023-07-08 DIAGNOSIS — F900 Attention-deficit hyperactivity disorder, predominantly inattentive type: Secondary | ICD-10-CM

## 2023-07-08 DIAGNOSIS — E66811 Obesity, class 1: Secondary | ICD-10-CM

## 2023-07-08 DIAGNOSIS — I499 Cardiac arrhythmia, unspecified: Secondary | ICD-10-CM | POA: Diagnosis not present

## 2023-07-08 MED ORDER — BUSPIRONE HCL 10 MG PO TABS
10.0000 mg | ORAL_TABLET | Freq: Two times a day (BID) | ORAL | 3 refills | Status: AC
  Filled 2023-07-08: qty 180, 90d supply, fill #0
  Filled 2023-10-08: qty 180, 90d supply, fill #1
  Filled 2024-01-01: qty 180, 90d supply, fill #2
  Filled 2024-03-29: qty 180, 90d supply, fill #3

## 2023-07-08 MED ORDER — METOPROLOL TARTRATE 25 MG PO TABS
25.0000 mg | ORAL_TABLET | Freq: Every day | ORAL | 0 refills | Status: DC | PRN
  Filled 2023-07-08: qty 90, 90d supply, fill #0

## 2023-07-08 MED ORDER — AMPHETAMINE-DEXTROAMPHET ER 20 MG PO CP24
20.0000 mg | ORAL_CAPSULE | ORAL | 0 refills | Status: DC
  Filled 2023-09-11: qty 90, 90d supply, fill #0

## 2023-07-08 NOTE — Assessment & Plan Note (Signed)
 Increased symptoms in Spring, oTC as needed meds as before

## 2023-07-08 NOTE — Progress Notes (Signed)
 [name]     MRN: [id number]      DOB: [birth year]  Chief Complaint  Patient presents with   Medical Management of Chronic Issues    5 month ADHD f/u     HPI Ms. Sherpa is here for follow up and re-evaluation of chronic medical conditions, medication management and review of any available recent lab and radiology data.  Preventive health is updated, specifically  Cancer screening and Immunization.   Questions or concerns regarding consultations or procedures which the PT has had in the interim are  addressed. The PT denies any adverse reactions to current medications since the last visit.  C/o increased stress in past several months, illness in family members, has been out of her rooutineof exercise with minimal weight gain, has needed beta  blocker 4 times in past 4 to 6 weeks   ROS Denies recent fever or chills. Denies sinus pressure, nasal congestion, ear pain or sore throat. Denies chest congestion, productive cough or wheezing. Denies chest pains, palpitations and leg swelling Denies abdominal pain, nausea, vomiting,diarrhea or constipation.   Denies dysuria, frequency, hesitancy or incontinence. Denies joint pain, swelling and limitation in mobility. Denies headaches, seizures, numbness, or tingling. Denies depression, anxiety or insomnia. Denies skin break down or rash.   PE  BP 134/83   Pulse 84   Ht 5\' 3"  (1.6 m)   Wt 191 lb 1.9 oz (86.7 kg)   LMP  (LMP Unknown)   SpO2 98%   BMI 33.86 kg/m   Patient alert and oriented and in no cardiopulmonary distress.  HEENT: No facial asymmetry, EOMI,     Neck supple .  Chest: Clear to auscultation bilaterally.  CVS: S1, S2 no murmurs, no S3.Regular rate.  .   Ext: No edema  MS: Adequate ROM spine, shoulders, hips and knees.  Skin: Intact, no ulcerations or rash noted.  Psych: Good eye contact, normal affect. Memory intact not anxious or depressed appearing.  CNS: CN 2-12 intact, power,  normal  throughout.no focal deficits noted.   Assessment & Plan  ADHD (attention deficit hyperactivity disorder) Controlled, no change in medication   Irregular heart rate Very occasional , prob 4 episodes in 1 month, has had increased stress, med refill  Insomnia Sleep hygiene reviewed and written information offered also. Prescription sent for  medication needed.   Hyperlipidemia Hyperlipidemia:Low fat diet discussed and encouraged.   Lipid Panel  Lab Results  Component Value Date   CHOL 168 02/14/2023   HDL 50 02/14/2023   LDLCALC 101 (H) 02/14/2023   TRIG 91 02/14/2023   CHOLHDL 3.4 02/14/2023     Updated lab needed at/ before next visit.   Obesity (BMI 30.0-34.9)  Patient re-educated about  the importance of commitment to a  minimum of 150 minutes of exercise per week as able.  The importance of healthy food choices with portion control discussed, as well as eating regularly and within a 12 hour window most days. The need to choose "clean , green" food 50 to 75% of the time is discussed, as well as to make water the primary drink and set a goal of 64 ounces water daily.       07/08/2023    8:24 AM 04/30/2023    1:38 PM 02/07/2023    1:38 PM  Weight /BMI  Weight 191 lb 1.9 oz 189 lb 8 oz 184 lb 1.3 oz  Height 5\' 3"  (1.6 m) 5\' 3"  (1.6 m) 5\' 2"  (1.575 m)  BMI 33.86 kg/m2 33.57 kg/m2 33.67 kg/m2      Vitamin D deficiency Updated lab needed at/ before next visit.

## 2023-07-08 NOTE — Assessment & Plan Note (Signed)
Sleep hygiene reviewed and written information offered also. Prescription sent for  medication needed.  

## 2023-07-08 NOTE — Assessment & Plan Note (Signed)
 Controlled, no change in medication

## 2023-07-08 NOTE — Assessment & Plan Note (Signed)
 Hyperlipidemia:Low fat diet discussed and encouraged.   Lipid Panel  Lab Results  Component Value Date   CHOL 168 02/14/2023   HDL 50 02/14/2023   LDLCALC 101 (H) 02/14/2023   TRIG 91 02/14/2023   CHOLHDL 3.4 02/14/2023     Updated lab needed at/ before next visit.

## 2023-07-08 NOTE — Patient Instructions (Addendum)
 F/Un in 14 to 15 weeks, call if you need me sooner  No changes in medication  Fasting labs 3 to 5 days prior to next visit  It is important that you exercise regularly at least 30 minutes 5 times a week. If you develop chest pain, have severe difficulty breathing, or feel very tired, stop exercising immediately and seek medical attention   Try melatonin 3 mg one to 2 at bedtime, see if sleep time can be extended to 6 hours , but do nOT worry about it!  Think about what you will eat, plan ahead. Choose " clean, green, fresh or frozen" over canned, processed or packaged foods which are more sugary, salty and fatty. 70 to 75% of food eaten should be vegetables and fruit. Three meals at set times with snacks allowed between meals, but they must be fruit or vegetables. Aim to eat over a 12 hour period , example 7 am to 7 pm, and STOP after  your last meal of the day. Drink water,generally about 64 ounces per day, no other drink is as healthy. Fruit juice is best enjoyed in a healthy way, by EATING the fruit.  Thanks for choosing [facility], we consider it a privelige to serve you.

## 2023-07-08 NOTE — Assessment & Plan Note (Signed)
  Patient re-educated about  the importance of commitment to a  minimum of 150 minutes of exercise per week as able.  The importance of healthy food choices with portion control discussed, as well as eating regularly and within a 12 hour window most days. The need to choose "clean , green" food 50 to 75% of the time is discussed, as well as to make water the primary drink and set a goal of 64 ounces water daily.       07/08/2023    8:24 AM 04/30/2023    1:38 PM 02/07/2023    1:38 PM  Weight /BMI  Weight 191 lb 1.9 oz 189 lb 8 oz 184 lb 1.3 oz  Height 5\' 3"  (1.6 m) 5\' 3"  (1.6 m) 5\' 2"  (1.575 m)  BMI 33.86 kg/m2 33.57 kg/m2 33.67 kg/m2

## 2023-07-08 NOTE — Assessment & Plan Note (Signed)
 DASH diet and commitment to daily physical activity for a minimum of 30 minutes discussed and encouraged, as a part of hypertension management. The importance of attaining a healthy weight is also discussed.     07/08/2023    8:31 AM 07/08/2023    8:24 AM 04/30/2023    2:54 PM 04/30/2023    1:38 PM 02/07/2023    1:38 PM 06/26/2022    9:37 AM 02/21/2022    9:53 AM  BP/Weight  Systolic BP 134 133 138 132 124 126 122  Diastolic BP 83 95 90 94 87 92 90  Wt. (Lbs)  191.12  189.5 184.08 187   BMI  33.86 kg/m2  33.57 kg/m2 33.67 kg/m2 34.2 kg/m2      Re eval in at next visit

## 2023-07-08 NOTE — Assessment & Plan Note (Signed)
 Updated lab needed at/ before next visit.

## 2023-07-08 NOTE — Assessment & Plan Note (Signed)
 Very occasional , prob 4 episodes in 1 month, has had increased stress, med refill

## 2023-09-11 ENCOUNTER — Other Ambulatory Visit (HOSPITAL_COMMUNITY): Payer: Self-pay

## 2023-10-14 ENCOUNTER — Ambulatory Visit: Admitting: Family Medicine

## 2023-10-24 ENCOUNTER — Encounter: Payer: Self-pay | Admitting: Family Medicine

## 2023-10-24 ENCOUNTER — Ambulatory Visit: Admitting: Family Medicine

## 2023-10-24 VITALS — BP 132/82 | HR 77 | Resp 16 | Ht 62.0 in | Wt 193.0 lb

## 2023-10-24 DIAGNOSIS — M2550 Pain in unspecified joint: Secondary | ICD-10-CM

## 2023-10-24 DIAGNOSIS — F411 Generalized anxiety disorder: Secondary | ICD-10-CM

## 2023-10-24 DIAGNOSIS — E66811 Obesity, class 1: Secondary | ICD-10-CM | POA: Diagnosis not present

## 2023-10-24 DIAGNOSIS — F900 Attention-deficit hyperactivity disorder, predominantly inattentive type: Secondary | ICD-10-CM | POA: Diagnosis not present

## 2023-10-24 DIAGNOSIS — M069 Rheumatoid arthritis, unspecified: Secondary | ICD-10-CM

## 2023-10-24 DIAGNOSIS — E785 Hyperlipidemia, unspecified: Secondary | ICD-10-CM

## 2023-10-24 DIAGNOSIS — Z1329 Encounter for screening for other suspected endocrine disorder: Secondary | ICD-10-CM

## 2023-10-24 DIAGNOSIS — E559 Vitamin D deficiency, unspecified: Secondary | ICD-10-CM

## 2023-10-24 DIAGNOSIS — K219 Gastro-esophageal reflux disease without esophagitis: Secondary | ICD-10-CM

## 2023-10-24 NOTE — Progress Notes (Unsigned)
 [name]     MRN: [id number]      DOB: 02/24/1981  Chief Complaint  Patient presents with   Medical Management of Chronic Issues    14 week follow up     HPI [name] is here for follow up and re-evaluation of chronic medical conditions, medication management and review of any available recent lab and radiology data.  Preventive health is updated, specifically  Cancer screening and Immunization.   Questions or concerns regarding consultations or procedures which the PT has had in the interim are  addressed. The PT denies any adverse reactions to current medications since the last visit.  2 month h/o increased pain, swelling , stiffness, weakness if both hands, with deformity of digits, right worse than left, left hip discomfort, symptoms reduce as day goes byPain initally 6 to 7 , hip  and low back pain wakes her last 2 months Daughter dx with  genetic GI disorder wants labs, one daughter newly diafgnosed  with RA and the other is being tested Inconsistent food  choice with weight re gain,a lot of stress , family health challenges  ROS Denies recent fever or chills. Denies sinus pressure, nasal congestion, ear pain or sore throat. Denies chest congestion, productive cough or wheezing. Denies chest pains, palpitations and leg swelling Denies abdominal pain, nausea, vomiting,diarrhea or constipation.   Denies dysuria, frequency, hesitancy or incontinence.  Denies headaches, seizures, numbness, or tingling.  Denies skin break down or rash.   PE  BP 132/82   Pulse 77   Resp 16   Ht 5' 2 (1.575 m)   Wt 193 lb 0.6 oz (87.6 kg)   SpO2 97%   BMI 35.31 kg/m   Patient alert and oriented and in no cardiopulmonary distress.  HEENT: No facial asymmetry, EOMI,     Neck supple .  Chest: Clear to auscultation bilaterally.  CVS: S1, S2 no murmurs, no S3.Regular rate.  ABD: Soft non tender.   Ext: No edema  MS: tender over lumbosacral and sI joints, deformity of digits  and weak grip, shoulders, hips and knees.  Skin: Intact, no ulcerations or rash noted.  Psych: Good eye contact, normal affect. Memory intact not anxious or depressed appearing.  CNS: CN 2-12 intact, power,  normal throughout.no focal deficits noted.   Assessment & Plan  Arthralgia Complains of generalized joint pain most notably in the last 2 months.  Positive family history of rheumatoid arthritis.  Will screen for underlying pathology and refer to rheumatologist also.  ADHD (attention deficit hyperactivity disorder) Controlled, no change in medication   GAD (generalized anxiety disorder) Controlled, no change in medication   Hyperlipidemia Hyperlipidemia:Low fat diet discussed and encouraged.   Lipid Panel  Lab Results  Component Value Date   CHOL 173 10/24/2023   HDL 47 10/24/2023   LDLCALC 103 (H) 10/24/2023   TRIG 128 10/24/2023   CHOLHDL 3.4 02/14/2023     Needs to olwer dietary fat  Obesity (BMI 30.0-34.9)  Patient re-educated about  the importance of commitment to a  minimum of 150 minutes of exercise per week as able.  The importance of healthy food choices with portion control discussed, as well as eating regularly and within a 12 hour window most days. The need to choose clean , green food 50 to 75% of the time is discussed, as well as to make water the primary drink and set a goal of 64 ounces water daily.  10/24/2023    8:03 AM 07/08/2023    8:24 AM 04/30/2023    1:38 PM  Weight /BMI  Weight 193 lb 0.6 oz 191 lb 1.9 oz 189 lb 8 oz  Height 5' 2 (1.575 m) 5' 3 (1.6 m) 5' 3 (1.6 m)  BMI 35.31 kg/m2 33.86 kg/m2 33.57 kg/m2      Vitamin D  deficiency In normal range in 2025  Screening for thyroid  disorder Normal function

## 2023-10-24 NOTE — Patient Instructions (Signed)
 F/U mid to end Novemebr, call if you need me sooner  Labs today as already ordered , ad on rheumatoid factor, ESR, ANA, alpha 1 antitrypsin , C- reactive protein quant pls,   It is important that you exercise regularly at least 30 minutes 5 times a week. If you develop chest pain, have severe difficulty breathing, or feel very tired, stop exercising immediately and seek medical attention    Think about what you will eat, plan ahead. Choose  clean, green, fresh or frozen over canned, processed or packaged foods which are more sugary, salty and fatty. 70 to 75% of food eaten should be vegetables and fruit. Three meals at set times with snacks allowed between meals, but they must be fruit or vegetables. Aim to eat over a 12 hour period , example 7 am to 7 pm, and STOP after  your last meal of the day. Drink water,generally about 64 ounces per day, no other drink is as healthy. Fruit juice is best enjoyed in a healthy way, by EATING the fruit. Thanks for choosing [facility], we consider it a privelige to serve you.

## 2023-10-25 ENCOUNTER — Encounter: Payer: Self-pay | Admitting: Family Medicine

## 2023-10-25 DIAGNOSIS — Z1329 Encounter for screening for other suspected endocrine disorder: Secondary | ICD-10-CM | POA: Insufficient documentation

## 2023-10-25 DIAGNOSIS — M2550 Pain in unspecified joint: Secondary | ICD-10-CM | POA: Insufficient documentation

## 2023-10-25 DIAGNOSIS — M069 Rheumatoid arthritis, unspecified: Secondary | ICD-10-CM | POA: Insufficient documentation

## 2023-10-25 MED ORDER — AMPHETAMINE-DEXTROAMPHET ER 20 MG PO CP24
20.0000 mg | ORAL_CAPSULE | ORAL | 0 refills | Status: AC
  Filled 2023-12-15: qty 90, 90d supply, fill #0

## 2023-10-25 NOTE — Assessment & Plan Note (Signed)
 Hyperlipidemia:Low fat diet discussed and encouraged.   Lipid Panel  Lab Results  Component Value Date   CHOL 173 10/24/2023   HDL 47 10/24/2023   LDLCALC 103 (H) 10/24/2023   TRIG 128 10/24/2023   CHOLHDL 3.4 02/14/2023     Needs to olwer dietary fat

## 2023-10-25 NOTE — Assessment & Plan Note (Signed)
 Controlled, no change in medication

## 2023-10-25 NOTE — Assessment & Plan Note (Signed)
 In normal range in 2025

## 2023-10-25 NOTE — Assessment & Plan Note (Signed)
  Patient re-educated about  the importance of commitment to a  minimum of 150 minutes of exercise per week as able.  The importance of healthy food choices with portion control discussed, as well as eating regularly and within a 12 hour window most days. The need to choose clean , green food 50 to 75% of the time is discussed, as well as to make water the primary drink and set a goal of 64 ounces water daily.       10/24/2023    8:03 AM 07/08/2023    8:24 AM 04/30/2023    1:38 PM  Weight /BMI  Weight 193 lb 0.6 oz 191 lb 1.9 oz 189 lb 8 oz  Height 5' 2 (1.575 m) 5' 3 (1.6 m) 5' 3 (1.6 m)  BMI 35.31 kg/m2 33.86 kg/m2 33.57 kg/m2

## 2023-10-25 NOTE — Assessment & Plan Note (Signed)
 Normal function.

## 2023-10-25 NOTE — Assessment & Plan Note (Signed)
 Complains of generalized joint pain most notably in the last 2 months.  Positive family history of rheumatoid arthritis.  Will screen for underlying pathology and refer to rheumatologist also.

## 2023-10-26 ENCOUNTER — Ambulatory Visit: Payer: Self-pay | Admitting: Family Medicine

## 2023-10-26 ENCOUNTER — Encounter: Payer: Self-pay | Admitting: Family Medicine

## 2023-10-26 ENCOUNTER — Other Ambulatory Visit (HOSPITAL_COMMUNITY): Payer: Self-pay

## 2023-10-26 LAB — CBC WITH DIFFERENTIAL/PLATELET
Basophils Absolute: 0.1 x10E3/uL (ref 0.0–0.2)
Basos: 1 %
EOS (ABSOLUTE): 0.3 x10E3/uL (ref 0.0–0.4)
Eos: 3 %
Hematocrit: 45.2 % (ref 34.0–46.6)
Hemoglobin: 14.6 g/dL (ref 11.1–15.9)
Immature Grans (Abs): 0 x10E3/uL (ref 0.0–0.1)
Immature Granulocytes: 0 %
Lymphocytes Absolute: 3.1 x10E3/uL (ref 0.7–3.1)
Lymphs: 26 %
MCH: 29.4 pg (ref 26.6–33.0)
MCHC: 32.3 g/dL (ref 31.5–35.7)
MCV: 91 fL (ref 79–97)
Monocytes Absolute: 1 x10E3/uL — ABNORMAL HIGH (ref 0.1–0.9)
Monocytes: 9 %
Neutrophils Absolute: 7.1 x10E3/uL — ABNORMAL HIGH (ref 1.4–7.0)
Neutrophils: 61 %
Platelets: 331 x10E3/uL (ref 150–450)
RBC: 4.96 x10E6/uL (ref 3.77–5.28)
RDW: 12.7 % (ref 11.7–15.4)
WBC: 11.6 x10E3/uL — ABNORMAL HIGH (ref 3.4–10.8)

## 2023-10-26 LAB — CMP14+EGFR
ALT: 21 IU/L (ref 0–32)
AST: 18 IU/L (ref 0–40)
Albumin: 4 g/dL (ref 3.9–4.9)
Alkaline Phosphatase: 95 IU/L (ref 44–121)
BUN/Creatinine Ratio: 40 — ABNORMAL HIGH (ref 9–23)
BUN: 25 mg/dL — ABNORMAL HIGH (ref 6–24)
Bilirubin Total: <0.2 mg/dL (ref 0.0–1.2)
CO2: 20 mmol/L (ref 20–29)
Calcium: 9.8 mg/dL (ref 8.7–10.2)
Chloride: 103 mmol/L (ref 96–106)
Creatinine, Ser: 0.62 mg/dL (ref 0.57–1.00)
Globulin, Total: 2.5 g/dL (ref 1.5–4.5)
Glucose: 83 mg/dL (ref 70–99)
Potassium: 4.3 mmol/L (ref 3.5–5.2)
Sodium: 138 mmol/L (ref 134–144)
Total Protein: 6.5 g/dL (ref 6.0–8.5)
eGFR: 115 mL/min/1.73 (ref >59)

## 2023-10-26 LAB — TSH+FREE T4
Free T4: 0.96 ng/dL (ref 0.82–1.77)
TSH: 4.41 u[IU]/mL (ref 0.450–4.500)

## 2023-10-26 LAB — LIPID PANEL W/O CHOL/HDL RATIO
Cholesterol, Total: 173 mg/dL (ref 100–199)
HDL: 47 mg/dL (ref >39)
LDL Chol Calc (NIH): 103 mg/dL — ABNORMAL HIGH (ref 0–99)
Triglycerides: 128 mg/dL (ref 0–149)
VLDL Cholesterol Cal: 23 mg/dL (ref 5–40)

## 2023-10-26 LAB — RHEUMATOID FACTOR: Rheumatoid fact SerPl-aCnc: <10 [IU]/mL (ref <14.0)

## 2023-10-26 LAB — VITAMIN D 25 HYDROXY (VIT D DEFICIENCY, FRACTURES): Vit D, 25-Hydroxy: 51.5 ng/mL (ref 30.0–100.0)

## 2023-10-26 LAB — ANA W/REFLEX

## 2023-10-26 LAB — SEDIMENTATION RATE: Sed Rate: 12 mm/h (ref 0–32)

## 2023-10-26 LAB — ALPHA-1-ANTITRYPSIN: A-1 Antitrypsin: 110 mg/dL (ref 101–187)

## 2023-10-26 LAB — C-REACTIVE PROTEIN: CRP: 16 mg/L — AB (ref 0–10)

## 2023-12-15 ENCOUNTER — Other Ambulatory Visit (HOSPITAL_COMMUNITY): Payer: Self-pay

## 2023-12-16 ENCOUNTER — Other Ambulatory Visit (HOSPITAL_COMMUNITY): Payer: Self-pay

## 2023-12-22 ENCOUNTER — Other Ambulatory Visit (HOSPITAL_COMMUNITY): Payer: Self-pay

## 2024-01-04 ENCOUNTER — Telehealth: Admitting: Family

## 2024-01-04 DIAGNOSIS — B3731 Acute candidiasis of vulva and vagina: Secondary | ICD-10-CM | POA: Diagnosis not present

## 2024-01-04 MED ORDER — FLUCONAZOLE 150 MG PO TABS: 150.0000 mg | ORAL_TABLET | ORAL | 0 refills | Status: DC | PRN

## 2024-01-04 NOTE — Progress Notes (Signed)

## 2024-01-04 NOTE — Progress Notes (Signed)
Approximately 5 minutes was spent documenting and reviewing patient's chart.

## 2024-01-20 DIAGNOSIS — M7989 Other specified soft tissue disorders: Secondary | ICD-10-CM | POA: Diagnosis not present

## 2024-01-20 DIAGNOSIS — Z6835 Body mass index (BMI) 35.0-35.9, adult: Secondary | ICD-10-CM | POA: Diagnosis not present

## 2024-01-20 DIAGNOSIS — E669 Obesity, unspecified: Secondary | ICD-10-CM | POA: Diagnosis not present

## 2024-01-20 DIAGNOSIS — R5383 Other fatigue: Secondary | ICD-10-CM | POA: Diagnosis not present

## 2024-01-26 LAB — LAB REPORT - SCANNED
EGFR: 113
HM Hepatitis Screen: NEGATIVE

## 2024-02-17 DIAGNOSIS — E669 Obesity, unspecified: Secondary | ICD-10-CM | POA: Diagnosis not present

## 2024-02-17 DIAGNOSIS — Z6835 Body mass index (BMI) 35.0-35.9, adult: Secondary | ICD-10-CM | POA: Diagnosis not present

## 2024-02-17 DIAGNOSIS — M7989 Other specified soft tissue disorders: Secondary | ICD-10-CM | POA: Diagnosis not present

## 2024-03-03 ENCOUNTER — Ambulatory Visit: Admitting: Family Medicine

## 2024-03-03 ENCOUNTER — Other Ambulatory Visit (HOSPITAL_COMMUNITY): Payer: Self-pay

## 2024-03-03 VITALS — BP 128/90 | HR 75 | Resp 16 | Ht 62.0 in | Wt 194.0 lb

## 2024-03-03 DIAGNOSIS — Z23 Encounter for immunization: Secondary | ICD-10-CM

## 2024-03-03 DIAGNOSIS — K219 Gastro-esophageal reflux disease without esophagitis: Secondary | ICD-10-CM

## 2024-03-03 DIAGNOSIS — Z1329 Encounter for screening for other suspected endocrine disorder: Secondary | ICD-10-CM

## 2024-03-03 DIAGNOSIS — E785 Hyperlipidemia, unspecified: Secondary | ICD-10-CM

## 2024-03-03 DIAGNOSIS — F411 Generalized anxiety disorder: Secondary | ICD-10-CM | POA: Diagnosis not present

## 2024-03-03 DIAGNOSIS — E559 Vitamin D deficiency, unspecified: Secondary | ICD-10-CM | POA: Diagnosis not present

## 2024-03-03 DIAGNOSIS — F321 Major depressive disorder, single episode, moderate: Secondary | ICD-10-CM

## 2024-03-03 DIAGNOSIS — F909 Attention-deficit hyperactivity disorder, unspecified type: Secondary | ICD-10-CM

## 2024-03-03 MED ORDER — VENLAFAXINE HCL ER 37.5 MG PO CP24
37.5000 mg | ORAL_CAPSULE | Freq: Every day | ORAL | 3 refills | Status: AC
  Filled 2024-03-03: qty 30, 30d supply, fill #0
  Filled 2024-03-29: qty 30, 30d supply, fill #1
  Filled 2024-04-28: qty 30, 30d supply, fill #2

## 2024-03-03 NOTE — Patient Instructions (Addendum)
 F/U  in 10 to 12 weeks  Pls schedule December mammogram at checkout  TdAP today  New medication effexor and I recommend you reach out to therapy  It is important that you exercise regularly at least 30 minutes 5 times a week. If you develop chest pain, have severe difficulty breathing, or feel very tired, stop exercising immediately and seek medical attention      TSH , vit D, lipid panel, cmp and EGFr and cBC, 1 week before follow up  Thanks for choosing [facility], we consider it a privelige to serve you.

## 2024-03-04 ENCOUNTER — Other Ambulatory Visit (HOSPITAL_COMMUNITY): Payer: Self-pay

## 2024-03-04 ENCOUNTER — Encounter: Payer: Self-pay | Admitting: Family Medicine

## 2024-03-04 DIAGNOSIS — F321 Major depressive disorder, single episode, moderate: Secondary | ICD-10-CM | POA: Insufficient documentation

## 2024-03-04 DIAGNOSIS — Z23 Encounter for immunization: Secondary | ICD-10-CM | POA: Insufficient documentation

## 2024-03-04 MED ORDER — AMPHETAMINE-DEXTROAMPHET ER 20 MG PO CP24
20.0000 mg | ORAL_CAPSULE | ORAL | 0 refills | Status: AC
  Filled 2024-03-17: qty 90, 90d supply, fill #0

## 2024-03-04 NOTE — Progress Notes (Signed)
 [name]     MRN: [id number]      DOB: 08-Jun-1980  Chief Complaint  Patient presents with   Medical Management of Chronic Issues    Follow up     HPI [name] is here for follow up and re-evaluation of chronic medical conditions, medication management and review of any available recent lab and radiology data.  Preventive health is updated, specifically  Cancer screening and Immunization.   Questions or concerns regarding consultations or procedures which the PT has had in the interim are  addressed.Being followed by rheumatology no definite diagnosis or treatment at this time  1 month h/o increased anxiety and stress, father has stage 4 lung cancer and has been hospitalized twice in the past month, last week , daughter, 41 y/o at college has sustained HR of 140 and is asymptomatic, has cardiology eval upcoming The PT denies any adverse reactions to current medications since the last visit.   ROS Denies recent fever or chills. Denies sinus pressure, nasal congestion, ear pain or sore throat. Denies chest congestion, productive cough or wheezing. Denies chest pains, palpitations and leg swelling Denies abdominal pain, nausea, vomiting,diarrhea or constipation.   Denies dysuria, frequency, hesitancy or incontinence. Denies joint pain, swelling and limitation in mobility. Denies headaches, seizures, numbness, or tingling. Denies skin break down or rash.   PE  BP (!) 128/90   Pulse 75   Resp 16   Ht 5' 2 (1.575 m)   Wt 194 lb (88 kg)   SpO2 98%   BMI 35.48 kg/m   Patient alert and oriented and in no cardiopulmonary distress.  HEENT: No facial asymmetry, EOMI,     Neck supple .  Chest: Clear to auscultation bilaterally.  CVS: S1, S2 no murmurs, no S3.Regular rate.  ABD: Soft non tender.   Ext: No edema  MS: Adequate ROM spine, shoulders, hips and knees.  Skin: Intact, no ulcerations or rash noted.  Psych: Good eye contact, flat affect. Memory intact not  anxious  mildly depressed appearing.  CNS: CN 2-12 intact, power,  normal throughout.no focal deficits noted.   Assessment & Plan  Depression, major, single episode, moderate (HCC) Encouraged to seek help with therapy through employee assistance , and started on effexor   ADHD (attention deficit hyperactivity disorder) Controlled, no change in medication   Morbid obesity (HCC)  Patient re-educated about  the importance of commitment to a  minimum of 150 minutes of exercise per week as able.  The importance of healthy food choices with portion control discussed, as well as eating regularly and within a 12 hour window most days. The need to choose clean , green food 50 to 75% of the time is discussed, as well as to make water the primary drink and set a goal of 64 ounces water daily.       03/03/2024    8:40 AM 10/24/2023    8:03 AM 07/08/2023    8:24 AM  Weight /BMI  Weight 194 lb 193 lb 0.6 oz 191 lb 1.9 oz  Height 5' 2 (1.575 m) 5' 2 (1.575 m) 5' 3 (1.6 m)  BMI 35.48 kg/m2 35.31 kg/m2 33.86 kg/m2    Stable, will intentionally increase physical activity  GAD (generalized anxiety disorder) Controlled, no change in medication   Vitamin D  deficiency Updated lab needed at/ before next visit.   Hyperlipidemia Hyperlipidemia:Low fat diet discussed and encouraged.   Lipid Panel  Lab Results  Component Value Date   CHOL  173 10/24/2023   HDL 47 10/24/2023   LDLCALC 103 (H) 10/24/2023   TRIG 128 10/24/2023   CHOLHDL 3.4 02/14/2023     Updated lab needed at/ before next visit.   Screening for thyroid  disorder Updated lab needed at/ before next visit.   Immunization due After obtaining informed consent, the TdAP vaccine is  administered , with no adverse effect noted at the time of administration.

## 2024-03-04 NOTE — Assessment & Plan Note (Signed)
 Controlled, no change in medication

## 2024-03-04 NOTE — Assessment & Plan Note (Signed)
  Patient re-educated about  the importance of commitment to a  minimum of 150 minutes of exercise per week as able.  The importance of healthy food choices with portion control discussed, as well as eating regularly and within a 12 hour window most days. The need to choose clean , green food 50 to 75% of the time is discussed, as well as to make water the primary drink and set a goal of 64 ounces water daily.       03/03/2024    8:40 AM 10/24/2023    8:03 AM 07/08/2023    8:24 AM  Weight /BMI  Weight 194 lb 193 lb 0.6 oz 191 lb 1.9 oz  Height 5' 2 (1.575 m) 5' 2 (1.575 m) 5' 3 (1.6 m)  BMI 35.48 kg/m2 35.31 kg/m2 33.86 kg/m2    Stable, will intentionally increase physical activity

## 2024-03-04 NOTE — Assessment & Plan Note (Signed)
 After obtaining informed consent, the TdAP  vaccine is  administered , with no adverse effect noted at the time of administration.

## 2024-03-04 NOTE — Assessment & Plan Note (Signed)
 Encouraged to seek help with therapy through employee assistance , and started on effexor

## 2024-03-04 NOTE — Assessment & Plan Note (Signed)
 Updated lab needed at/ before next visit.

## 2024-03-04 NOTE — Assessment & Plan Note (Signed)
 Hyperlipidemia:Low fat diet discussed and encouraged.   Lipid Panel  Lab Results  Component Value Date   CHOL 173 10/24/2023   HDL 47 10/24/2023   LDLCALC 103 (H) 10/24/2023   TRIG 128 10/24/2023   CHOLHDL 3.4 02/14/2023     Updated lab needed at/ before next visit.

## 2024-03-17 ENCOUNTER — Encounter: Payer: Self-pay | Admitting: Family Medicine

## 2024-03-17 ENCOUNTER — Other Ambulatory Visit (HOSPITAL_COMMUNITY): Payer: Self-pay

## 2024-03-17 ENCOUNTER — Other Ambulatory Visit: Payer: Self-pay | Admitting: Family Medicine

## 2024-03-17 MED ORDER — AMPHETAMINE-DEXTROAMPHET ER 15 MG PO CP24
15.0000 mg | ORAL_CAPSULE | ORAL | 0 refills | Status: AC
  Filled 2024-03-17: qty 90, 90d supply, fill #0

## 2024-03-18 ENCOUNTER — Other Ambulatory Visit (HOSPITAL_COMMUNITY): Payer: Self-pay

## 2024-03-22 ENCOUNTER — Other Ambulatory Visit (HOSPITAL_COMMUNITY): Payer: Self-pay | Admitting: Family Medicine

## 2024-03-22 DIAGNOSIS — Z1231 Encounter for screening mammogram for malignant neoplasm of breast: Secondary | ICD-10-CM

## 2024-03-23 ENCOUNTER — Other Ambulatory Visit: Payer: Self-pay | Admitting: Physician Assistant

## 2024-03-23 ENCOUNTER — Other Ambulatory Visit (HOSPITAL_COMMUNITY): Payer: Self-pay

## 2024-03-23 ENCOUNTER — Telehealth: Admitting: Physician Assistant

## 2024-03-23 DIAGNOSIS — B9689 Other specified bacterial agents as the cause of diseases classified elsewhere: Secondary | ICD-10-CM

## 2024-03-23 DIAGNOSIS — J019 Acute sinusitis, unspecified: Secondary | ICD-10-CM | POA: Diagnosis not present

## 2024-03-23 MED ORDER — AMOXICILLIN-POT CLAVULANATE 875-125 MG PO TABS: 1.0000 | ORAL_TABLET | Freq: Two times a day (BID) | ORAL | 0 refills | Status: AC

## 2024-03-23 MED ORDER — FLUCONAZOLE 150 MG PO TABS
150.0000 mg | ORAL_TABLET | ORAL | 0 refills | Status: AC
  Filled 2024-03-23: qty 2, 6d supply, fill #0

## 2024-03-23 NOTE — Progress Notes (Signed)

## 2024-03-25 ENCOUNTER — Encounter (HOSPITAL_COMMUNITY)

## 2024-03-25 DIAGNOSIS — Z1231 Encounter for screening mammogram for malignant neoplasm of breast: Secondary | ICD-10-CM

## 2024-04-23 ENCOUNTER — Other Ambulatory Visit (HOSPITAL_COMMUNITY): Payer: Self-pay | Admitting: Family Medicine

## 2024-04-23 DIAGNOSIS — Z1231 Encounter for screening mammogram for malignant neoplasm of breast: Secondary | ICD-10-CM

## 2024-04-28 ENCOUNTER — Other Ambulatory Visit (HOSPITAL_COMMUNITY): Payer: Self-pay

## 2024-04-28 ENCOUNTER — Encounter (HOSPITAL_COMMUNITY): Payer: Self-pay

## 2024-04-28 ENCOUNTER — Other Ambulatory Visit: Payer: Self-pay

## 2024-04-28 ENCOUNTER — Other Ambulatory Visit: Payer: Self-pay | Admitting: Family Medicine

## 2024-04-28 ENCOUNTER — Ambulatory Visit (HOSPITAL_COMMUNITY)
Admission: RE | Admit: 2024-04-28 | Discharge: 2024-04-28 | Disposition: A | Discharge status: Home / Self Care | Source: Ambulatory Visit | Attending: Family Medicine | Admitting: Family Medicine

## 2024-04-28 DIAGNOSIS — Z1231 Encounter for screening mammogram for malignant neoplasm of breast: Secondary | ICD-10-CM | POA: Insufficient documentation

## 2024-04-28 MED ORDER — METOPROLOL TARTRATE 25 MG PO TABS
25.0000 mg | ORAL_TABLET | Freq: Every day | ORAL | 0 refills | Status: AC | PRN
  Filled 2024-04-28: qty 90, 90d supply, fill #0

## 2024-04-29 ENCOUNTER — Other Ambulatory Visit (HOSPITAL_COMMUNITY): Payer: Self-pay

## 2024-05-27 ENCOUNTER — Ambulatory Visit: Admitting: Family Medicine

## 2024-06-11 ENCOUNTER — Ambulatory Visit: Admitting: Family Medicine
